# Patient Record
Sex: Female | Born: 1990
Health system: Southern US, Community
[De-identification: ages and names within clinical notes are randomized; demographics above are authoritative.]

## PROBLEM LIST (undated history)

## (undated) ENCOUNTER — Inpatient Hospital Stay (HOSPITAL_COMMUNITY): Payer: Self-pay

## (undated) ENCOUNTER — Emergency Department (HOSPITAL_BASED_OUTPATIENT_CLINIC_OR_DEPARTMENT_OTHER): Admission: EM | Payer: Managed Care, Other (non HMO)

## (undated) ENCOUNTER — Ambulatory Visit (HOSPITAL_COMMUNITY): Admission: EM | Source: Home / Self Care

## (undated) DIAGNOSIS — I10 Essential (primary) hypertension: Secondary | ICD-10-CM

## (undated) DIAGNOSIS — F419 Anxiety disorder, unspecified: Secondary | ICD-10-CM

## (undated) DIAGNOSIS — B009 Herpesviral infection, unspecified: Secondary | ICD-10-CM

## (undated) DIAGNOSIS — B999 Unspecified infectious disease: Secondary | ICD-10-CM

## (undated) DIAGNOSIS — Z8619 Personal history of other infectious and parasitic diseases: Secondary | ICD-10-CM

## (undated) HISTORY — DX: Anxiety disorder, unspecified: F41.9

## (undated) HISTORY — DX: Essential (primary) hypertension: I10

## (undated) HISTORY — DX: Personal history of other infectious and parasitic diseases: Z86.19

## (undated) HISTORY — PX: WISDOM TOOTH EXTRACTION: SHX21

## (undated) HISTORY — PX: TONSILLECTOMY AND ADENOIDECTOMY: SUR1326

---
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
## 2012-05-28 ENCOUNTER — Emergency Department (HOSPITAL_BASED_OUTPATIENT_CLINIC_OR_DEPARTMENT_OTHER)
Admission: EM | Admit: 2012-05-28 | Discharge: 2012-05-28 | Discharge status: Against Medical Advice | Payer: BC Managed Care – PPO | Attending: Emergency Medicine | Admitting: Emergency Medicine

## 2012-05-28 ENCOUNTER — Encounter (HOSPITAL_BASED_OUTPATIENT_CLINIC_OR_DEPARTMENT_OTHER): Payer: Self-pay | Admitting: *Deleted

## 2012-05-28 DIAGNOSIS — R110 Nausea: Secondary | ICD-10-CM | POA: Insufficient documentation

## 2012-05-28 DIAGNOSIS — R197 Diarrhea, unspecified: Secondary | ICD-10-CM | POA: Insufficient documentation

## 2012-05-28 DIAGNOSIS — Z3202 Encounter for pregnancy test, result negative: Secondary | ICD-10-CM | POA: Insufficient documentation

## 2012-05-28 DIAGNOSIS — R1084 Generalized abdominal pain: Secondary | ICD-10-CM | POA: Insufficient documentation

## 2012-05-28 LAB — URINALYSIS, ROUTINE W REFLEX MICROSCOPIC
Glucose, UA: NEGATIVE mg/dL
Protein, ur: NEGATIVE mg/dL
pH: 6.5 (ref 5.0–8.0)

## 2012-05-28 LAB — PREGNANCY, URINE: Preg Test, Ur: NEGATIVE

## 2012-05-28 LAB — URINE MICROSCOPIC-ADD ON

## 2012-05-28 NOTE — ED Notes (Signed)
Patient asked to be removed from the "list".  Consulting civil engineer notified.

## 2012-05-28 NOTE — ED Notes (Signed)
Pt describes generalized stabbing abd pain onset 12:30 +nausea. Denies discharge or urinary s/s. +diarrhea.

## 2012-09-24 ENCOUNTER — Emergency Department (HOSPITAL_BASED_OUTPATIENT_CLINIC_OR_DEPARTMENT_OTHER)
Admission: EM | Admit: 2012-09-24 | Discharge: 2012-09-24 | Disposition: A | Discharge status: Home / Self Care | Payer: BC Managed Care – PPO | Attending: Emergency Medicine | Admitting: Emergency Medicine

## 2012-09-24 ENCOUNTER — Encounter (HOSPITAL_BASED_OUTPATIENT_CLINIC_OR_DEPARTMENT_OTHER): Payer: Self-pay | Admitting: *Deleted

## 2012-09-24 DIAGNOSIS — N390 Urinary tract infection, site not specified: Secondary | ICD-10-CM | POA: Insufficient documentation

## 2012-09-24 DIAGNOSIS — M545 Low back pain, unspecified: Secondary | ICD-10-CM | POA: Insufficient documentation

## 2012-09-24 LAB — URINALYSIS, ROUTINE W REFLEX MICROSCOPIC
Bilirubin Urine: NEGATIVE
Ketones, ur: NEGATIVE mg/dL
Nitrite: NEGATIVE
pH: 6.5 (ref 5.0–8.0)

## 2012-09-24 LAB — URINE MICROSCOPIC-ADD ON

## 2012-09-24 MED ORDER — HYDROCODONE-ACETAMINOPHEN 5-325 MG PO TABS: 2.0000 | ORAL_TABLET | ORAL | Status: DC | PRN

## 2012-09-24 MED ORDER — SULFAMETHOXAZOLE-TRIMETHOPRIM 800-160 MG PO TABS: 1.0000 | ORAL_TABLET | Freq: Two times a day (BID) | ORAL | Status: DC

## 2012-09-24 NOTE — ED Notes (Signed)
Lower back pain that started last night. Denies any injury, denies any abd pain or urinary problems.

## 2012-09-24 NOTE — ED Provider Notes (Signed)
History    This chart was scribed for Geoffery Lyons, MD by Quintella Reichert, ED scribe.  This patient was seen in room MH08/MH08 and the patient's care was started at 9:51 PM.  CSN: [id number]  Arrival date & time 09/24/12  1906    Chief Complaint  Patient presents with  . Back Pain    The history is provided by the patient. No language interpreter was used.    HPI Comments: Annette Greene is a 22 y.o. female who presents to the Emergency Department complaining of sudden-onset, constant, moderate lower back pain that began several hours ago.  Pt reports that her pain came on suddenly when she was "trying to squat in the public restroom" because she did not want to sit on the toilet.  Pain radiates into the left buttock and is exacerbated by lying on her side.  She denies dysuria, bowel or bladder incontinence, weakness, numbness or any other associated symptoms.  She denies h/o UTIs to her knowledge.    History reviewed. No pertinent past medical history.   Past Surgical History  Procedure Laterality Date  . Tonsillectomy      No family history on file.   History  Substance Use Topics  . Smoking status: Never Smoker   . Smokeless tobacco: Not on file  . Alcohol Use: No    OB History   Grav Para Term Preterm Abortions TAB SAB Ect Mult Living                   Review of Systems  All other systems reviewed and are negative.       Allergies  Review of patient's allergies indicates no known allergies.  Home Medications  No current outpatient prescriptions on file.   BP 121/87  Pulse 78  Temp(Src) 98.5 F (36.9 C) (Oral)  Resp 20  Ht 5\' 7"  (1.702 m)  Wt 275 lb (124.739 kg)  BMI 43.06 kg/m2  SpO2 100%   Physical Exam  Nursing note and vitals reviewed. Constitutional: She is oriented to person, place, and time. She appears well-developed and well-nourished. No distress.  HENT:  Head: Normocephalic and atraumatic.  Eyes: EOM are normal.  Neck: Neck  supple. No tracheal deviation present.  Cardiovascular: Normal rate.   Pulmonary/Chest: Effort normal. No respiratory distress.  Musculoskeletal: Normal range of motion. She exhibits tenderness.  Tenderness to palpation in soft tissues of lumbar region  Neurological: She is alert and oriented to person, place, and time.  DTRs are trace and equal. Strength 5/5 to lower extremities bilaterally. Able to walk without difficulty.  Skin: Skin is warm and dry.  Psychiatric: She has a normal mood and affect. Her behavior is normal.     ED Course  Procedures (including critical care time)   DIAGNOSTIC STUDIES: Oxygen Saturation is 100% on room air, normal by my interpretation.    COORDINATION OF CARE: 9:55 PM- Informed pt that labs reveal pt has a UTI.  Discussed treatment plan which includes antibiotics and pain medication with pt at bedside and pt agreed to plan.     Labs Reviewed  URINALYSIS, ROUTINE W REFLEX MICROSCOPIC - Abnormal; Notable for the following:    APPearance CLOUDY (*)    Hgb urine dipstick TRACE (*)    Leukocytes, UA LARGE (*)    All other components within normal limits  URINE MICROSCOPIC-ADD ON - Abnormal; Notable for the following:    Squamous Epithelial / LPF MANY (*)    Bacteria, UA  MANY (*)    All other components within normal limits  URINE CULTURE    No results found.   No diagnosis found.   MDM  This does not seem like a musculoskeletal issue.  The exam and history is not concerning for an emergent cause.  Will treat with antibiotics for the urine.  Return prn.    I personally performed the services described in this documentation, which was scribed in my presence. The recorded information has been reviewed and is accurate.      Geoffery Lyons, MD 09/25/12 [phone]

## 2012-09-26 LAB — URINE CULTURE: Colony Count: 60000

## 2013-03-21 ENCOUNTER — Ambulatory Visit: Payer: BC Managed Care – PPO

## 2013-03-21 ENCOUNTER — Ambulatory Visit (INDEPENDENT_AMBULATORY_CARE_PROVIDER_SITE_OTHER): Payer: BC Managed Care – PPO | Admitting: Family Medicine

## 2013-03-21 VITALS — BP 110/70 | HR 79 | Temp 98.5°F | Resp 18 | Ht 66.5 in | Wt 297.0 lb

## 2013-03-21 DIAGNOSIS — R300 Dysuria: Secondary | ICD-10-CM

## 2013-03-21 DIAGNOSIS — R1110 Vomiting, unspecified: Secondary | ICD-10-CM

## 2013-03-21 DIAGNOSIS — R0789 Other chest pain: Secondary | ICD-10-CM

## 2013-03-21 DIAGNOSIS — M791 Myalgia, unspecified site: Secondary | ICD-10-CM

## 2013-03-21 DIAGNOSIS — IMO0001 Reserved for inherently not codable concepts without codable children: Secondary | ICD-10-CM

## 2013-03-21 DIAGNOSIS — D71 Functional disorders of polymorphonuclear neutrophils: Secondary | ICD-10-CM

## 2013-03-21 DIAGNOSIS — B9789 Other viral agents as the cause of diseases classified elsewhere: Secondary | ICD-10-CM

## 2013-03-21 DIAGNOSIS — B349 Viral infection, unspecified: Secondary | ICD-10-CM

## 2013-03-21 LAB — POCT URINALYSIS DIPSTICK
Bilirubin, UA: NEGATIVE
Glucose, UA: NEGATIVE
Ketones, UA: NEGATIVE
Leukocytes, UA: NEGATIVE
Nitrite, UA: NEGATIVE

## 2013-03-21 LAB — POCT CBC
Granulocyte percent: 43 %G (ref 37–80)
HCT, POC: 38.6 % (ref 37.7–47.9)
Lymph, poc: 2 (ref 0.6–3.4)
MCH, POC: 25.2 pg — AB (ref 27–31.2)
MCHC: 30.1 g/dL — AB (ref 31.8–35.4)
MCV: 84 fL (ref 80–97)
POC LYMPH PERCENT: 46.9 %L (ref 10–50)
RDW, POC: 16.9 %
WBC: 4.2 10*3/uL — AB (ref 4.6–10.2)

## 2013-03-21 LAB — POCT UA - MICROSCOPIC ONLY: Yeast, UA: NEGATIVE

## 2013-03-21 MED ORDER — ONDANSETRON 4 MG PO TBDP: ORAL_TABLET | ORAL | Status: DC

## 2013-03-21 NOTE — Progress Notes (Signed)
Subjective: 22 year old lady who's had some problem since Christmas Day. She got nausea  and vomited, about 3 times. She did not have any diarrhea or constipation. She has had low available aching when she bears down to go to the bathroom, either to move her bowels or to urinate. She has a headache and generalized body aches. Her chest feels tight and like it is hard to take a good deep breath. She does not have earaches or sore throat or cough. She has not been running a fever. She had her last menstrual cycle couple of weeks ago. She is sexually involved but faithfully uses condoms 100% of the time. 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. She lives at home. There are some smaller children in the home. No one else has had the same kind of symptoms. She does have some mild dysuria. She only has a history of one urinary tract infection.  Objective: Overweight young lady in no major acute distress. TMs are normal. Throat clear. Neck supple without significant nodes. Chest is clear to auscultation. Heart regular without murmurs. Abdomen was soft without hepatomegaly or masses. Mild left lower quadrant and low  abdominall nonspecific tenderness. Results for orders placed in visit on 03/21/13  POCT UA - MICROSCOPIC ONLY      Result Value Range   WBC, Ur, HPF, POC 2-5     RBC, urine, microscopic 0-4     Bacteria, U Microscopic 2+     Mucus, UA positive     Epithelial cells, urine per micros 0-5     Crystals, Ur, HPF, POC neg     Casts, Ur, LPF, POC neg     Yeast, UA neg    POCT URINALYSIS DIPSTICK      Result Value Range   Color, UA amber     Clarity, UA cloudy     Glucose, UA neg     Bilirubin, UA neg     Ketones, UA neg     Spec Grav, UA >=1.030     Blood, UA trace-intact     pH, UA 5.5     Protein, UA 30     Urobilinogen, UA 0.2     Nitrite, UA neg     Leukocytes, UA Negative       Assessment: Vomiting Body aches Chest tightness and difficulty taking a deep  breath Dysuria without evidence for UTI  Plan: Check CBC Chest x-ray Urine hcg  Results for orders placed in visit on 03/21/13  POCT UA - MICROSCOPIC ONLY      Result Value Range   WBC, Ur, HPF, POC 2-5     RBC, urine, microscopic 0-4     Bacteria, U Microscopic 2+     Mucus, UA positive     Epithelial cells, urine per micros 0-5     Crystals, Ur, HPF, POC neg     Casts, Ur, LPF, POC neg     Yeast, UA neg    POCT URINALYSIS DIPSTICK      Result Value Range   Color, UA amber     Clarity, UA cloudy     Glucose, UA neg     Bilirubin, UA neg     Ketones, UA neg     Spec Grav, UA >=1.030     Blood, UA trace-intact     pH, UA 5.5     Protein, UA 30     Urobilinogen, UA 0.2     Nitrite,  UA neg     Leukocytes, UA Negative    POCT CBC      Result Value Range   WBC 4.2 (*) 4.6 - 10.2 K/uL   Lymph, poc 2.0  0.6 - 3.4   POC LYMPH PERCENT 46.9  10 - 50 %L   MID (cbc) 0.4  0 - 0.9   POC MID % 10.1  0 - 12 %M   POC Granulocyte 1.8 (*) 2 - 6.9   Granulocyte percent 43.0  37 - 80 %G   RBC 4.60  4.04 - 5.48 M/uL   Hemoglobin 11.6 (*) 12.2 - 16.2 g/dL   HCT, POC 95.6  21.3 - 47.9 %   MCV 84.0  80 - 97 fL   MCH, POC 25.2 (*) 27 - 31.2 pg   MCHC 30.1 (*) 31.8 - 35.4 g/dL   RDW, POC 08.6     Platelet Count, POC 241  142 - 424 K/uL   MPV 9.9  0 - 99.8 fL  POCT URINE PREGNANCY      Result Value Range   Preg Test, Ur Negative     [facility] reading (PRIMARY) by  Dr. Alwyn Ren Normal chet  Assessment: Nonspecific symptoms, presumably viral  Plan: Zofran for nausea Drink plenty of fluids If urinary symptoms continue to persist get rechecked, but this should pass on by  .

## 2013-03-21 NOTE — Patient Instructions (Signed)
Drink plenty of fluids  Get plenty of rest  Take Zofran if needed for nausea  Get rechecked if worse  Tylenol or ibuprofen for pain

## 2013-06-17 ENCOUNTER — Emergency Department (HOSPITAL_BASED_OUTPATIENT_CLINIC_OR_DEPARTMENT_OTHER): Payer: BC Managed Care – PPO

## 2013-06-17 ENCOUNTER — Emergency Department (HOSPITAL_BASED_OUTPATIENT_CLINIC_OR_DEPARTMENT_OTHER)
Admission: EM | Admit: 2013-06-17 | Discharge: 2013-06-17 | Disposition: A | Discharge status: Home / Self Care | Payer: BC Managed Care – PPO | Attending: Emergency Medicine | Admitting: Emergency Medicine

## 2013-06-17 ENCOUNTER — Encounter (HOSPITAL_BASED_OUTPATIENT_CLINIC_OR_DEPARTMENT_OTHER): Payer: Self-pay | Admitting: Emergency Medicine

## 2013-06-17 DIAGNOSIS — Y92009 Unspecified place in unspecified non-institutional (private) residence as the place of occurrence of the external cause: Secondary | ICD-10-CM | POA: Insufficient documentation

## 2013-06-17 DIAGNOSIS — W010XXA Fall on same level from slipping, tripping and stumbling without subsequent striking against object, initial encounter: Secondary | ICD-10-CM | POA: Insufficient documentation

## 2013-06-17 DIAGNOSIS — Y9389 Activity, other specified: Secondary | ICD-10-CM | POA: Insufficient documentation

## 2013-06-17 DIAGNOSIS — F17200 Nicotine dependence, unspecified, uncomplicated: Secondary | ICD-10-CM | POA: Insufficient documentation

## 2013-06-17 DIAGNOSIS — S5001XA Contusion of right elbow, initial encounter: Secondary | ICD-10-CM

## 2013-06-17 DIAGNOSIS — S5000XA Contusion of unspecified elbow, initial encounter: Secondary | ICD-10-CM | POA: Insufficient documentation

## 2013-06-17 NOTE — ED Notes (Signed)
Pt states that she was playing around when she fell down one step off her porch. C/o right arm pain. C/o numbness and tingling to right arm. Pain worse with movement. Denies loc. No obvious deformity but pain when touching arm above right elbow down right forearm.

## 2013-06-17 NOTE — ED Provider Notes (Addendum)
CSN: [id number]     Arrival date & time 06/17/13  0117 History   First MD Initiated Contact with Patient 06/17/13 0359     Chief Complaint  Patient presents with  . Arm Injury     (Consider location/radiation/quality/duration/timing/severity/associated sxs/prior Treatment) HPI This is a 23 year old female who fell off her porch after slipping. This occurred about midnight. She is having pain in her right elbow which radiates down her right forearm. The pain is 7/10 at its worst. It is exacerbated by flexion and extension at the elbow and by palpation but not by pronation or supination.  History reviewed. No pertinent past medical history. Past Surgical History  Procedure Laterality Date  . Tonsillectomy     Family History  Problem Relation Age of Onset  . Healthy Mother   . Healthy Father   . Healthy Sister   . Healthy Maternal Grandmother   . Healthy Paternal Grandmother   . Healthy Paternal Grandfather   . Healthy Sister    History  Substance Use Topics  . Smoking status: Current Every Day Smoker  . Smokeless tobacco: Not on file  . Alcohol Use: Yes     Comment: occasional    OB History   Grav Para Term Preterm Abortions TAB SAB Ect Mult Living                 Review of Systems  All other systems reviewed and are negative.   Allergies  Review of patient's allergies indicates no known allergies.  Home Medications   Current Outpatient Rx  Name  Route  Sig  Dispense  Refill  . ondansetron (ZOFRAN ODT) 4 MG disintegrating tablet      Take one every 6 hours if needed for nausea or vomiting   10 tablet   0    BP 136/65  Pulse 67  Temp(Src) 98.3 F (36.8 C) (Oral)  Resp 16  Ht 5\' 7"  (1.702 m)  Wt 270 lb (122.471 kg)  BMI 42.28 kg/m2  SpO2 100%  LMP 06/12/2013  Physical Exam General: Well-developed, well-nourished female in no acute distress; appearance consistent with age of record HENT: normocephalic; atraumatic Eyes: Normal appearance Neck:  supple Heart: regular rate and rhythm Lungs: Normal respiratory effort and excursion Abdomen: soft; nondistended Extremities: No deformity; tenderness of right elbow just proximal to the olecranon with pain on flexion and extension of the right elbow; no tenderness of the right forearm, wrist, snuff box or hand Neurologic: Awake, alert and oriented; motor function intact in all extremities and symmetric; no facial droop Skin: Warm and dry Psychiatric: Normal mood and affect    ED Course  Procedures (including critical care time)   MDM   Nursing notes and vitals signs, including pulse oximetry, reviewed.  Summary of this visit's results, reviewed by myself:  Imaging Studies: Dg Forearm Right  06/17/2013   CLINICAL DATA:  Fall, mid forearm pain.  EXAM: RIGHT FOREARM - 2 VIEW  COMPARISON:  None.  FINDINGS: There is no evidence of fracture or other focal bone lesions. Soft tissues are unremarkable.  IMPRESSION: Negative.   Electronically Signed   By: Elon Alas   On: 06/17/2013 02:07   4:09 AM Patient advised of xray findings. No sail sign seen. Patient declines a sling or analgesic.     Wynetta Fines, MD 06/17/13 0408  Wynetta Fines, MD 06/17/13 3818

## 2013-06-17 NOTE — Discharge Instructions (Signed)
Elbow Contusion °An elbow contusion is a deep bruise of the elbow. Contusions are the result of an injury that caused bleeding under the skin. The contusion may turn blue, purple, or yellow. Minor injuries will give you a painless contusion, but more severe contusions may stay painful and swollen for a few weeks.  °CAUSES  °An elbow contusion comes from a direct force to that area, such as falling on the elbow. °SYMPTOMS  °· Swelling and redness of the elbow. °· Bruising of the elbow area. °· Tenderness or soreness of the elbow. °DIAGNOSIS  °You will have a physical exam and will be asked about your history. You may need an X-ray of your elbow to look for a broken bone (fracture).  °TREATMENT  °A sling or splint may be needed to support your injury. Resting, elevating, and applying cold compresses to the elbow area are often the best treatments for an elbow contusion. Over-the-counter medicines may also be recommended for pain control. °HOME CARE INSTRUCTIONS  °· Put ice on the injured area. °· Put ice in a plastic bag. °· Place a towel between your skin and the bag. °· Leave the ice on for 15-20 minutes, 03-04 times a day. °· Only take over-the-counter or prescription medicines for pain, discomfort, or fever as directed by your caregiver. °· Rest your injured elbow until the pain and swelling are better. °· Elevate your elbow to reduce swelling. °· Apply a compression wrap as directed by your caregiver. This can help reduce swelling and motion. You may remove the wrap for sleeping, showers, and baths. If your fingers become numb, cold, or blue, take the wrap off and reapply it more loosely. °· Use your elbow only as directed by your caregiver. You may be asked to do range of motion exercises. Do them as directed. °· See your caregiver as directed. It is very important to keep all follow-up appointments in order to avoid any long-term problems with your elbow, including chronic pain or inability to move your elbow  normally. °SEEK IMMEDIATE MEDICAL CARE IF:  °· You have increased redness, swelling, or pain in your elbow. °· Your swelling or pain is not relieved with medicines. °· You have swelling of the hand and fingers. °· You are unable to move your fingers or wrist. °· You begin to lose feeling in your hand or fingers. °· Your fingers or hand become cold or blue. °MAKE SURE YOU:  °· Understand these instructions. °· Will watch your condition. °· Will get help right away if you are not doing well or get worse. °Document Released: 02/14/2006 Document Revised: 05/31/2011 Document Reviewed: 01/22/2011 °[employer]® Patient Information ©2014 [employer]. ° °

## 2013-12-18 ENCOUNTER — Encounter (HOSPITAL_COMMUNITY): Payer: Self-pay | Admitting: Emergency Medicine

## 2013-12-18 ENCOUNTER — Emergency Department (INDEPENDENT_AMBULATORY_CARE_PROVIDER_SITE_OTHER)
Admission: EM | Admit: 2013-12-18 | Discharge: 2013-12-18 | Disposition: A | Discharge status: Home / Self Care | Payer: BC Managed Care – PPO | Source: Home / Self Care | Attending: Family Medicine | Admitting: Family Medicine

## 2013-12-18 DIAGNOSIS — A084 Viral intestinal infection, unspecified: Secondary | ICD-10-CM

## 2013-12-18 DIAGNOSIS — A088 Other specified intestinal infections: Secondary | ICD-10-CM

## 2013-12-18 LAB — POCT I-STAT, CHEM 8
BUN: 8 mg/dL (ref 6–23)
Calcium, Ion: 1.12 mmol/L (ref 1.12–1.23)
Chloride: 103 mEq/L (ref 96–112)
Creatinine, Ser: 0.7 mg/dL (ref 0.50–1.10)
GLUCOSE: 99 mg/dL (ref 70–99)
HEMATOCRIT: 44 % (ref 36.0–46.0)
HEMOGLOBIN: 15 g/dL (ref 12.0–15.0)
POTASSIUM: 4.2 meq/L (ref 3.7–5.3)
SODIUM: 140 meq/L (ref 137–147)
TCO2: 28 mmol/L (ref 0–100)

## 2013-12-18 LAB — POCT PREGNANCY, URINE: Preg Test, Ur: NEGATIVE

## 2013-12-18 MED ORDER — ONDANSETRON 4 MG PO TBDP
ORAL_TABLET | ORAL | Status: AC
  Filled 2013-12-18: qty 2

## 2013-12-18 MED ORDER — GI COCKTAIL ~~LOC~~
30.0000 mL | Freq: Once | ORAL | Status: AC
  Administered 2013-12-18: 30 mL via ORAL

## 2013-12-18 MED ORDER — ONDANSETRON 4 MG PO TBDP
8.0000 mg | ORAL_TABLET | Freq: Once | ORAL | Status: AC
  Administered 2013-12-18: 8 mg via ORAL

## 2013-12-18 MED ORDER — ONDANSETRON HCL 8 MG PO TABS: 8.0000 mg | ORAL_TABLET | Freq: Three times a day (TID) | ORAL | Status: DC | PRN

## 2013-12-18 MED ORDER — GI COCKTAIL ~~LOC~~
ORAL | Status: AC
  Filled 2013-12-18: qty 30

## 2013-12-18 NOTE — Discharge Instructions (Signed)
Thank you for coming in today. Take Zofran as needed for vomiting Come back as needed If your belly pain worsens, or you have high fever, bad vomiting, blood in your stool or black tarry stool go to the Emergency Room.   Viral Gastroenteritis Viral gastroenteritis is also known as stomach flu. This condition affects the stomach and intestinal tract. It can cause sudden diarrhea and vomiting. The illness typically lasts 3 to 8 days. Most people develop an immune response that eventually gets rid of the virus. While this natural response develops, the virus can make you quite ill. CAUSES  Many different viruses can cause gastroenteritis, such as rotavirus or noroviruses. You can catch one of these viruses by consuming contaminated food or water. You may also catch a virus by sharing utensils or other personal items with an infected person or by touching a contaminated surface. SYMPTOMS  The most common symptoms are diarrhea and vomiting. These problems can cause a severe loss of body fluids (dehydration) and a body salt (electrolyte) imbalance. Other symptoms may include:  Fever.  Headache.  Fatigue.  Abdominal pain. DIAGNOSIS  Your caregiver can usually diagnose viral gastroenteritis based on your symptoms and a physical exam. A stool sample may also be taken to test for the presence of viruses or other infections. TREATMENT  This illness typically goes away on its own. Treatments are aimed at rehydration. The most serious cases of viral gastroenteritis involve vomiting so severely that you are not able to keep fluids down. In these cases, fluids must be given through an intravenous line (IV). HOME CARE INSTRUCTIONS   Drink enough fluids to keep your urine clear or pale yellow. Drink small amounts of fluids frequently and increase the amounts as tolerated.  Ask your caregiver for specific rehydration instructions.  Avoid:  Foods high in sugar.  Alcohol.  Carbonated  drinks.  Tobacco.  Juice.  Caffeine drinks.  Extremely hot or cold fluids.  Fatty, greasy foods.  Too much intake of anything at one time.  Dairy products until 24 to 48 hours after diarrhea stops.  You may consume probiotics. Probiotics are active cultures of beneficial bacteria. They may lessen the amount and number of diarrheal stools in adults. Probiotics can be found in yogurt with active cultures and in supplements.  Wash your hands well to avoid spreading the virus.  Only take over-the-counter or prescription medicines for pain, discomfort, or fever as directed by your caregiver. Do not give aspirin to children. Antidiarrheal medicines are not recommended.  Ask your caregiver if you should continue to take your regular prescribed and over-the-counter medicines.  Keep all follow-up appointments as directed by your caregiver. SEEK IMMEDIATE MEDICAL CARE IF:   You are unable to keep fluids down.  You do not urinate at least once every 6 to 8 hours.  You develop shortness of breath.  You notice blood in your stool or vomit. This may look like coffee grounds.  You have abdominal pain that increases or is concentrated in one small area (localized).  You have persistent vomiting or diarrhea.  You have a fever.  The patient is a child younger than 3 months, and he or she has a fever.  The patient is a child older than 3 months, and he or she has a fever and persistent symptoms.  The patient is a child older than 3 months, and he or she has a fever and symptoms suddenly get worse.  The patient is a baby, and he or  she has no tears when crying. MAKE SURE YOU:   Understand these instructions.  Will watch your condition.  Will get help right away if you are not doing well or get worse. Document Released: 03/08/2005 Document Revised: 05/31/2011 Document Reviewed: 12/23/2010 [facility] Patient Information 2015 [place], [place]. This information is not intended to replace  advice given to you by your health care provider. Make sure you discuss any questions you have with your health care provider.

## 2013-12-18 NOTE — ED Notes (Signed)
Reports drinking liquor yesterday unsure of how much she consumed.  C/o n/v and diarrhea since.   No otc treatments tried.

## 2013-12-18 NOTE — ED Provider Notes (Signed)
Annette Greene is a 23 y.o. female who presents to Urgent Care today for nausea and vomiting. Patient drank excessively yesterday evening he developed nausea and vomiting and diarrhea since this morning. She additionally notes subjective fevers and jittery sensation. She feels quite miserable. No chest pains or palpitations. She's tried no medications yet.   History reviewed. No pertinent past medical history. History  Substance Use Topics  . Smoking status: Current Every Day Smoker  . Smokeless tobacco: Not on file  . Alcohol Use: Yes     Comment: occasional    ROS as above Medications: No current facility-administered medications for this encounter.   Current Outpatient Prescriptions  Medication Sig Dispense Refill  . ondansetron (ZOFRAN) 8 MG tablet Take 1 tablet (8 mg total) by mouth every 8 (eight) hours as needed for nausea or vomiting.  20 tablet  0    Exam:  BP 134/79  Pulse 104  Temp(Src) 98.6 F (37 C) (Oral)  Resp 18  SpO2 97%  LMP 11/17/2013 Gen: Well NAD HEENT: EOMI,  MMM Lungs: Normal work of breathing. CTABL Heart: RRR no MRG Abd: NABS, Soft. Nondistended, Nontender Exts: Brisk capillary refill, warm and well perfused.   Patient was given 8 mg of oral Zofran and a GI cocktail and some ginger ale and felt a lot better.  Results for orders placed during the hospital encounter of 12/18/13 (from the past 24 hour(s))  POCT PREGNANCY, URINE     Status: None   Collection Time    12/18/13  7:28 PM      Result Value Ref Range   Preg Test, Ur NEGATIVE  NEGATIVE  POCT I-STAT, CHEM 8     Status: None   Collection Time    12/18/13  8:10 PM      Result Value Ref Range   Sodium 140  137 - 147 mEq/L   Potassium 4.2  3.7 - 5.3 mEq/L   Chloride 103  96 - 112 mEq/L   BUN 8  6 - 23 mg/dL   Creatinine, Ser 0.70  0.50 - 1.10 mg/dL   Glucose, Bld 99  70 - 99 mg/dL   Calcium, Ion 1.12  1.12 - 1.23 mmol/L   TCO2 28  0 - 100 mmol/L   Hemoglobin 15.0  12.0 - 15.0 g/dL   HCT 44.0  36.0 - 46.0 %   No results found.  Assessment and Plan: 23 y.o. female with viral gastroenteritis plus hangover. Plan to treat with Zofran rest oral hydration and watchful waiting.  Discussed warning signs or symptoms. Please see discharge instructions. Patient expresses understanding.     Gregor Hams, MD 12/18/13 2044

## 2014-06-02 ENCOUNTER — Encounter (HOSPITAL_BASED_OUTPATIENT_CLINIC_OR_DEPARTMENT_OTHER): Payer: Self-pay | Admitting: Emergency Medicine

## 2014-06-02 DIAGNOSIS — Z720 Tobacco use: Secondary | ICD-10-CM | POA: Insufficient documentation

## 2014-06-02 DIAGNOSIS — A5901 Trichomonal vulvovaginitis: Secondary | ICD-10-CM | POA: Insufficient documentation

## 2014-06-02 DIAGNOSIS — Z3202 Encounter for pregnancy test, result negative: Secondary | ICD-10-CM | POA: Insufficient documentation

## 2014-06-02 NOTE — ED Notes (Signed)
Pt reports intermittent abd pain that she is currently having admits to nausea denies emesis or diarrhea

## 2014-06-03 ENCOUNTER — Emergency Department (HOSPITAL_BASED_OUTPATIENT_CLINIC_OR_DEPARTMENT_OTHER)
Admission: EM | Admit: 2014-06-03 | Discharge: 2014-06-03 | Disposition: A | Discharge status: Home / Self Care | Payer: Self-pay | Attending: Emergency Medicine | Admitting: Emergency Medicine

## 2014-06-03 DIAGNOSIS — A5901 Trichomonal vulvovaginitis: Secondary | ICD-10-CM

## 2014-06-03 LAB — WET PREP, GENITAL
CLUE CELLS WET PREP: NONE SEEN
YEAST WET PREP: NONE SEEN

## 2014-06-03 LAB — URINALYSIS, ROUTINE W REFLEX MICROSCOPIC
Bilirubin Urine: NEGATIVE
GLUCOSE, UA: NEGATIVE mg/dL
Hgb urine dipstick: NEGATIVE
Ketones, ur: NEGATIVE mg/dL
NITRITE: NEGATIVE
PH: 6.5 (ref 5.0–8.0)
Protein, ur: NEGATIVE mg/dL
Specific Gravity, Urine: 1.026 (ref 1.005–1.030)
Urobilinogen, UA: 0.2 mg/dL (ref 0.0–1.0)

## 2014-06-03 LAB — URINE MICROSCOPIC-ADD ON

## 2014-06-03 LAB — PREGNANCY, URINE: Preg Test, Ur: NEGATIVE

## 2014-06-03 MED ORDER — METRONIDAZOLE 500 MG PO TABS
2000.0000 mg | ORAL_TABLET | Freq: Once | ORAL | Status: AC
  Administered 2014-06-03: 2000 mg via ORAL
  Filled 2014-06-03: qty 4

## 2014-06-03 NOTE — Discharge Instructions (Signed)

## 2014-06-03 NOTE — ED Provider Notes (Signed)
CSN: [id number]     Arrival date & time 06/02/14  2327 History   First MD Initiated Contact with Patient 06/03/14 0113     Chief Complaint  Patient presents with  . Abdominal Pain     (Consider location/radiation/quality/duration/timing/severity/associated sxs/prior Treatment) HPI This is a 24 year old female whose last menstrual period was December 27 of 2015. She is here complaining of a one-month history of lower abdominal cramping. The cramping is described as a 5 out of 10 at its worst. It is similar to menstrual cramping. She has had no vaginal bleeding since her last period. She does have a white vaginal discharge. She admits to unprotected sex. She is not using any form of birth control. She denies fever, chills, vomiting, diarrhea or dysuria. She has had occasional nausea.  History reviewed. No pertinent past medical history. Past Surgical History  Procedure Laterality Date  . Tonsillectomy     Family History  Problem Relation Age of Onset  . Healthy Mother   . Healthy Father   . Healthy Sister   . Healthy Maternal Grandmother   . Healthy Paternal Grandmother   . Healthy Paternal Grandfather   . Healthy Sister    History  Substance Use Topics  . Smoking status: Current Every Day Smoker  . Smokeless tobacco: Not on file  . Alcohol Use: Yes     Comment: occasional    OB History    No data available     Review of Systems  All other systems reviewed and are negative.   Allergies  Review of patient's allergies indicates no known allergies.  Home Medications   Prior to Admission medications   Medication Sig Start Date End Date Taking? Authorizing Provider  ondansetron (ZOFRAN) 8 MG tablet Take 1 tablet (8 mg total) by mouth every 8 (eight) hours as needed for nausea or vomiting. 12/18/13   Gregor Hams, MD   BP 124/53 mmHg  Pulse 79  Temp(Src) 97.8 F (36.6 C) (Oral)  Resp 18  SpO2 100%  LMP 03/17/2014   Physical Exam  General: Well-developed,  well-nourished female in no acute distress; appearance consistent with age of record HENT: normocephalic; atraumatic Eyes: pupils equal, round and reactive to light; extraocular muscles intact Neck: supple Heart: regular rate and rhythm Lungs: clear to auscultation bilaterally Abdomen: soft; nondistended; nontender; no masses or hepatosplenomegaly; bowel sounds present GU: normal external genitalia; serous vaginal discharge; no vaginal bleeding; no cervical motion tenderness; no adnexal tenderness Extremities: No deformity; full range of motion; pulses normal Neurologic: Awake, alert and oriented; motor function intact in all extremities and symmetric; no facial droop Skin: Warm and dry Psychiatric: Normal mood and affect    ED Course  Procedures (including critical care time)   MDM  Nursing notes and vitals signs, including pulse oximetry, reviewed.  Summary of this visit's results, reviewed by myself:  Labs:  Results for orders placed or performed during the hospital encounter of 06/03/14 (from the past 24 hour(s))  Urinalysis, Routine w reflex microscopic     Status: Abnormal   Collection Time: 06/03/14  1:34 AM  Result Value Ref Range   Color, Urine YELLOW YELLOW   APPearance CLEAR CLEAR   Specific Gravity, Urine 1.026 1.005 - 1.030   pH 6.5 5.0 - 8.0   Glucose, UA NEGATIVE NEGATIVE mg/dL   Hgb urine dipstick NEGATIVE NEGATIVE   Bilirubin Urine NEGATIVE NEGATIVE   Ketones, ur NEGATIVE NEGATIVE mg/dL   Protein, ur NEGATIVE NEGATIVE mg/dL   Urobilinogen,  UA 0.2 0.0 - 1.0 mg/dL   Nitrite NEGATIVE NEGATIVE   Leukocytes, UA SMALL (A) NEGATIVE  Pregnancy, urine     Status: None   Collection Time: 06/03/14  1:34 AM  Result Value Ref Range   Preg Test, Ur NEGATIVE NEGATIVE  Urine microscopic-add on     Status: Abnormal   Collection Time: 06/03/14  1:34 AM  Result Value Ref Range   Squamous Epithelial / LPF RARE RARE   WBC, UA 3-6 <3 WBC/hpf   RBC / HPF 0-2 <3 RBC/hpf    Bacteria, UA FEW (A) RARE   Urine-Other MUCOUS PRESENT   Wet prep, genital     Status: Abnormal   Collection Time: 06/03/14  1:35 AM  Result Value Ref Range   Yeast Wet Prep HPF POC NONE SEEN NONE SEEN   Trich, Wet Prep FEW (A) NONE SEEN   Clue Cells Wet Prep HPF POC NONE SEEN NONE SEEN   WBC, Wet Prep HPF POC FEW (A) NONE SEEN      Shanon Rosser, MD 06/03/14 [phone]

## 2014-06-03 NOTE — ED Notes (Signed)
Patient reports lower abdominal pain for the past month.  Reports that she has not had a menstrual cycle since March 17, 2014.  Reports that she took a pregnancy test in February which was negative.  Reports that she is sexually active and does not use protection.  Reports she has had a milky white vaginal discharge.  Reports vaginal bleeding in January but reports it was just "spotting". Reports after sex she has also had bleeding.

## 2014-06-04 LAB — RPR: RPR: NONREACTIVE

## 2014-06-04 LAB — HIV ANTIBODY (ROUTINE TESTING W REFLEX): HIV SCREEN 4TH GENERATION: NONREACTIVE

## 2014-06-04 LAB — GC/CHLAMYDIA PROBE AMP (~~LOC~~) NOT AT ARMC
Chlamydia: NEGATIVE
NEISSERIA GONORRHEA: NEGATIVE

## 2014-06-16 ENCOUNTER — Emergency Department (HOSPITAL_COMMUNITY)
Admission: EM | Admit: 2014-06-16 | Discharge: 2014-06-16 | Disposition: A | Discharge status: Home / Self Care | Payer: Self-pay | Attending: Emergency Medicine | Admitting: Emergency Medicine

## 2014-06-16 ENCOUNTER — Encounter (HOSPITAL_COMMUNITY): Payer: Self-pay | Admitting: Physical Medicine and Rehabilitation

## 2014-06-16 DIAGNOSIS — Z3202 Encounter for pregnancy test, result negative: Secondary | ICD-10-CM | POA: Insufficient documentation

## 2014-06-16 DIAGNOSIS — K529 Noninfective gastroenteritis and colitis, unspecified: Secondary | ICD-10-CM | POA: Insufficient documentation

## 2014-06-16 DIAGNOSIS — Z720 Tobacco use: Secondary | ICD-10-CM | POA: Insufficient documentation

## 2014-06-16 DIAGNOSIS — R112 Nausea with vomiting, unspecified: Secondary | ICD-10-CM

## 2014-06-16 DIAGNOSIS — R197 Diarrhea, unspecified: Secondary | ICD-10-CM

## 2014-06-16 LAB — CBC WITH DIFFERENTIAL/PLATELET
BASOS ABS: 0 10*3/uL (ref 0.0–0.1)
BASOS PCT: 0 % (ref 0–1)
EOS ABS: 0.1 10*3/uL (ref 0.0–0.7)
EOS PCT: 2 % (ref 0–5)
HCT: 33.3 % — ABNORMAL LOW (ref 36.0–46.0)
HEMOGLOBIN: 10.4 g/dL — AB (ref 12.0–15.0)
LYMPHS ABS: 2.1 10*3/uL (ref 0.7–4.0)
Lymphocytes Relative: 32 % (ref 12–46)
MCH: 24.9 pg — ABNORMAL LOW (ref 26.0–34.0)
MCHC: 31.2 g/dL (ref 30.0–36.0)
MCV: 79.7 fL (ref 78.0–100.0)
MONO ABS: 0.4 10*3/uL (ref 0.1–1.0)
MONOS PCT: 7 % (ref 3–12)
NEUTROS ABS: 3.9 10*3/uL (ref 1.7–7.7)
Neutrophils Relative %: 59 % (ref 43–77)
Platelets: 302 10*3/uL (ref 150–400)
RBC: 4.18 MIL/uL (ref 3.87–5.11)
RDW: 15.2 % (ref 11.5–15.5)
WBC: 6.6 10*3/uL (ref 4.0–10.5)

## 2014-06-16 LAB — COMPREHENSIVE METABOLIC PANEL
ALBUMIN: 3.2 g/dL — AB (ref 3.5–5.2)
ALT: 16 U/L (ref 0–35)
ANION GAP: 8 (ref 5–15)
AST: 17 U/L (ref 0–37)
Alkaline Phosphatase: 54 U/L (ref 39–117)
BUN: 9 mg/dL (ref 6–23)
CO2: 29 mmol/L (ref 19–32)
Calcium: 8.7 mg/dL (ref 8.4–10.5)
Chloride: 104 mmol/L (ref 96–112)
Creatinine, Ser: 0.76 mg/dL (ref 0.50–1.10)
GFR calc non Af Amer: >90 mL/min (ref >90)
GLUCOSE: 109 mg/dL — AB (ref 70–99)
POTASSIUM: 3.9 mmol/L (ref 3.5–5.1)
Sodium: 141 mmol/L (ref 135–145)
Total Bilirubin: 0.1 mg/dL — ABNORMAL LOW (ref 0.3–1.2)
Total Protein: 6.6 g/dL (ref 6.0–8.3)

## 2014-06-16 LAB — URINALYSIS, ROUTINE W REFLEX MICROSCOPIC
Bilirubin Urine: NEGATIVE
Glucose, UA: NEGATIVE mg/dL
Ketones, ur: NEGATIVE mg/dL
Leukocytes, UA: NEGATIVE
NITRITE: NEGATIVE
Protein, ur: NEGATIVE mg/dL
Specific Gravity, Urine: 1.034 — ABNORMAL HIGH (ref 1.005–1.030)
UROBILINOGEN UA: 0.2 mg/dL (ref 0.0–1.0)
pH: 6 (ref 5.0–8.0)

## 2014-06-16 LAB — POC URINE PREG, ED: Preg Test, Ur: NEGATIVE

## 2014-06-16 LAB — URINE MICROSCOPIC-ADD ON

## 2014-06-16 MED ORDER — ONDANSETRON HCL 8 MG PO TABS: 8.0000 mg | ORAL_TABLET | Freq: Three times a day (TID) | ORAL | Status: DC | PRN

## 2014-06-16 MED ORDER — ONDANSETRON 4 MG PO TBDP
8.0000 mg | ORAL_TABLET | Freq: Once | ORAL | Status: AC
Start: 2014-06-16 — End: 2014-06-16
  Administered 2014-06-16: 8 mg via ORAL
  Filled 2014-06-16: qty 2

## 2014-06-16 NOTE — ED Notes (Signed)
Pt presents to department for evaluation of nausea/vomiting and diarrhea. Onset this morning. Last period December 27th. Denies pain. Pt is alert and oriented x4.

## 2014-06-16 NOTE — Discharge Instructions (Signed)
Use zofran as prescribed, as needed for nausea. Stay well hydrated with small sips of fluids throughout the day. Follow a BRAT (banana-rice-applesauce-toast) diet as described below for the next 24-48 hours. The 'BRAT' diet is suggested, then progress to diet as tolerated as symptoms abate. Call if bloody stools, persistent diarrhea, vomiting, fever or abdominal pain. Follow up with [facility] in 1 week for ongoing management, and to establish care. Return to ER for changing or worsening of symptoms.  Food Choices to Help Relieve Diarrhea When you have diarrhea, the foods you eat and your eating habits are very important. Choosing the right foods and drinks can help relieve diarrhea. Also, because diarrhea can last up to 7 days, you need to replace lost fluids and electrolytes (such as sodium, potassium, and chloride) in order to help prevent dehydration.  WHAT GENERAL GUIDELINES DO I NEED TO FOLLOW?  Slowly drink 1 cup (8 oz) of fluid for each episode of diarrhea. If you are getting enough fluid, your urine will be clear or pale yellow.  Eat starchy foods. Some good choices include white rice, white toast, pasta, low-fiber cereal, baked potatoes (without the skin), saltine crackers, and bagels.  Avoid large servings of any cooked vegetables.  Limit fruit to two servings per day. A serving is  cup or 1 small piece.  Choose foods with less than 2 g of fiber per serving.  Limit fats to less than 8 tsp (38 g) per day.  Avoid fried foods.  Eat foods that have probiotics in them. Probiotics can be found in certain dairy products.  Avoid foods and beverages that may increase the speed at which food moves through the stomach and intestines (gastrointestinal tract). Things to avoid include:  High-fiber foods, such as dried fruit, raw fruits and vegetables, nuts, seeds, and whole grain foods.  Spicy foods and high-fat foods.  Foods and beverages sweetened with high-fructose corn  syrup, honey, or sugar alcohols such as xylitol, sorbitol, and mannitol. WHAT FOODS ARE RECOMMENDED? Grains White rice. White, [place], or pita breads (fresh or toasted), including plain rolls, buns, or bagels. White pasta. Saltine, soda, or graham crackers. Pretzels. Low-fiber cereal. Cooked cereals made with water (such as cornmeal, farina, or cream cereals). Plain muffins. Matzo. Melba toast. Zwieback.  Vegetables Potatoes (without the skin). Strained tomato and vegetable juices. Most well-cooked and canned vegetables without seeds. Tender lettuce. Fruits Cooked or canned applesauce, apricots, cherries, fruit cocktail, grapefruit, peaches, pears, or plums. Fresh bananas, apples without skin, cherries, grapes, cantaloupe, grapefruit, peaches, oranges, or plums.  Meat and Other Protein Products Baked or boiled chicken. Eggs. Tofu. Fish. Seafood. Smooth peanut butter. Ground or well-cooked tender beef, ham, veal, lamb, pork, or poultry.  Dairy Plain yogurt, kefir, and unsweetened liquid yogurt. Lactose-free milk, buttermilk, or soy milk. Plain hard cheese. Beverages Sport drinks. Clear broths. Diluted fruit juices (except prune). Regular, caffeine-free sodas such as ginger ale. Water. Decaffeinated teas. Oral rehydration solutions. Sugar-free beverages not sweetened with sugar alcohols. Other Bouillon, broth, or soups made from recommended foods.  The items listed above may not be a complete list of recommended foods or beverages. Contact your dietitian for more options. WHAT FOODS ARE NOT RECOMMENDED? Grains Whole grain, whole wheat, bran, or rye breads, rolls, pastas, crackers, and cereals. Wild or brown rice. Cereals that contain more than 2 g of fiber per serving. Corn tortillas or taco shells. Cooked or dry oatmeal. Granola. Popcorn. Vegetables Raw vegetables. Cabbage, broccoli, Brussels sprouts, artichokes, baked beans,  beet greens, corn, kale, legumes, peas, sweet potatoes, and yams.  Potato skins. Cooked spinach and cabbage. Fruits Dried fruit, including raisins and dates. Raw fruits. Stewed or dried prunes. Fresh apples with skin, apricots, mangoes, pears, raspberries, and strawberries.  Meat and Other Protein Products Chunky peanut butter. Nuts and seeds. Beans and lentils. Berniece Salines.  Dairy High-fat cheeses. Milk, chocolate milk, and beverages made with milk, such as milk shakes. Cream. Ice cream. Sweets and Desserts Sweet rolls, doughnuts, and sweet breads. Pancakes and waffles. Fats and Oils Butter. Cream sauces. Margarine. Salad oils. Plain salad dressings. Olives. Avocados.  Beverages Caffeinated beverages (such as coffee, tea, soda, or energy drinks). Alcoholic beverages. Fruit juices with pulp. Prune juice. Soft drinks sweetened with high-fructose corn syrup or sugar alcohols. Other Coconut. Hot sauce. Chili powder. Mayonnaise. Gravy. Cream-based or milk-based soups.  The items listed above may not be a complete list of foods and beverages to avoid. Contact your dietitian for more information. WHAT SHOULD I DO IF I BECOME DEHYDRATED? Diarrhea can sometimes lead to dehydration. Signs of dehydration include dark urine and dry mouth and skin. If you think you are dehydrated, you should rehydrate with an oral rehydration solution. These solutions can be purchased at pharmacies, retail stores, or online.  Drink -1 cup (120-240 mL) of oral rehydration solution each time you have an episode of diarrhea. If drinking this amount makes your diarrhea worse, try drinking smaller amounts more often. For example, drink 1-3 tsp (5-15 mL) every 5-10 minutes.  A general rule for staying hydrated is to drink 1-2 L of fluid per day. Talk to your health care provider about the specific amount you should be drinking each day. Drink enough fluids to keep your urine clear or pale yellow. Document Released: 05/29/2003 Document Revised: 03/13/2013 Document Reviewed: 01/29/2013 [facility]  Patient Information 2015 [place], [place]. This information is not intended to replace advice given to you by your health care provider. Make sure you discuss any questions you have with your health care provider.   Nausea and Vomiting Nausea is a sick feeling that often comes before throwing up (vomiting). Vomiting is a reflex where stomach contents come out of your mouth. Vomiting can cause severe loss of body fluids (dehydration). Children and elderly adults can become dehydrated quickly, especially if they also have diarrhea. Nausea and vomiting are symptoms of a condition or disease. It is important to find the cause of your symptoms. CAUSES   Direct irritation of the stomach lining. This irritation can result from increased acid production (gastroesophageal reflux disease), infection, food poisoning, taking certain medicines (such as nonsteroidal anti-inflammatory drugs), alcohol use, or tobacco use.  Signals from the brain.These signals could be caused by a headache, heat exposure, an inner ear disturbance, increased pressure in the brain from injury, infection, a tumor, or a concussion, pain, emotional stimulus, or metabolic problems.  An obstruction in the gastrointestinal tract (bowel obstruction).  Illnesses such as diabetes, hepatitis, gallbladder problems, appendicitis, kidney problems, cancer, sepsis, atypical symptoms of a heart attack, or eating disorders.  Medical treatments such as chemotherapy and radiation.  Receiving medicine that makes you sleep (general anesthetic) during surgery. DIAGNOSIS Your caregiver may ask for tests to be done if the problems do not improve after a few days. Tests may also be done if symptoms are severe or if the reason for the nausea and vomiting is not clear. Tests may include:  Urine tests.  Blood tests.  Stool tests.  Cultures (to look for evidence  of infection).  X-rays or other imaging studies. Test results can help your caregiver make  decisions about treatment or the need for additional tests. TREATMENT You need to stay well hydrated. Drink frequently but in small amounts.You may wish to drink water, sports drinks, clear broth, or eat frozen ice pops or gelatin dessert to help stay hydrated.When you eat, eating slowly may help prevent nausea.There are also some antinausea medicines that may help prevent nausea. HOME CARE INSTRUCTIONS   Take all medicine as directed by your caregiver.  If you do not have an appetite, do not force yourself to eat. However, you must continue to drink fluids.  If you have an appetite, eat a normal diet unless your caregiver tells you differently.  Eat a variety of complex carbohydrates (rice, wheat, potatoes, bread), lean meats, yogurt, fruits, and vegetables.  Avoid high-fat foods because they are more difficult to digest.  Drink enough water and fluids to keep your urine clear or pale yellow.  If you are dehydrated, ask your caregiver for specific rehydration instructions. Signs of dehydration may include:  Severe thirst.  Dry lips and mouth.  Dizziness.  Dark urine.  Decreasing urine frequency and amount.  Confusion.  Rapid breathing or pulse. SEEK IMMEDIATE MEDICAL CARE IF:   You have blood or brown flecks (like coffee grounds) in your vomit.  You have black or bloody stools.  You have a severe headache or stiff neck.  You are confused.  You have severe abdominal pain.  You have chest pain or trouble breathing.  You do not urinate at least once every 8 hours.  You develop cold or clammy skin.  You continue to vomit for longer than 24 to 48 hours.  You have a fever. MAKE SURE YOU:   Understand these instructions.  Will watch your condition.  Will get help right away if you are not doing well or get worse. Document Released: 03/08/2005 Document Revised: 05/31/2011 Document Reviewed: 08/05/2010 [facility] Patient Information 2015 [place], [place]. This  information is not intended to replace advice given to you by your health care provider. Make sure you discuss any questions you have with your health care provider.  Viral Gastroenteritis Viral gastroenteritis is also known as stomach flu. This condition affects the stomach and intestinal tract. It can cause sudden diarrhea and vomiting. The illness typically lasts 3 to 8 days. Most people develop an immune response that eventually gets rid of the virus. While this natural response develops, the virus can make you quite ill. CAUSES  Many different viruses can cause gastroenteritis, such as rotavirus or noroviruses. You can catch one of these viruses by consuming contaminated food or water. You may also catch a virus by sharing utensils or other personal items with an infected person or by touching a contaminated surface. SYMPTOMS  The most common symptoms are diarrhea and vomiting. These problems can cause a severe loss of body fluids (dehydration) and a body salt (electrolyte) imbalance. Other symptoms may include:  Fever.  Headache.  Fatigue.  Abdominal pain. DIAGNOSIS  Your caregiver can usually diagnose viral gastroenteritis based on your symptoms and a physical exam. A stool sample may also be taken to test for the presence of viruses or other infections. TREATMENT  This illness typically goes away on its own. Treatments are aimed at rehydration. The most serious cases of viral gastroenteritis involve vomiting so severely that you are not able to keep fluids down. In these cases, fluids must be given through an intravenous  line (IV). HOME CARE INSTRUCTIONS   Drink enough fluids to keep your urine clear or pale yellow. Drink small amounts of fluids frequently and increase the amounts as tolerated.  Ask your caregiver for specific rehydration instructions.  Avoid:  Foods high in sugar.  Alcohol.  Carbonated drinks.  Tobacco.  Juice.  Caffeine drinks.  Extremely hot or cold  fluids.  Fatty, greasy foods.  Too much intake of anything at one time.  Dairy products until 24 to 48 hours after diarrhea stops.  You may consume probiotics. Probiotics are active cultures of beneficial bacteria. They may lessen the amount and number of diarrheal stools in adults. Probiotics can be found in yogurt with active cultures and in supplements.  Wash your hands well to avoid spreading the virus.  Only take over-the-counter or prescription medicines for pain, discomfort, or fever as directed by your caregiver. Do not give aspirin to children. Antidiarrheal medicines are not recommended.  Ask your caregiver if you should continue to take your regular prescribed and over-the-counter medicines.  Keep all follow-up appointments as directed by your caregiver. SEEK IMMEDIATE MEDICAL CARE IF:   You are unable to keep fluids down.  You do not urinate at least once every 6 to 8 hours.  You develop shortness of breath.  You notice blood in your stool or vomit. This may look like coffee grounds.  You have abdominal pain that increases or is concentrated in one small area (localized).  You have persistent vomiting or diarrhea.  You have a fever.  The patient is a child younger than 3 months, and he or she has a fever.  The patient is a child older than 3 months, and he or she has a fever and persistent symptoms.  The patient is a child older than 3 months, and he or she has a fever and symptoms suddenly get worse.  The patient is a baby, and he or she has no tears when crying. MAKE SURE YOU:   Understand these instructions.  Will watch your condition.  Will get help right away if you are not doing well or get worse. Document Released: 03/08/2005 Document Revised: 05/31/2011 Document Reviewed: 12/23/2010 [facility] Patient Information 2015 [place], [place]. This information is not intended to replace advice given to you by your health care provider. Make sure you discuss  any questions you have with your health care provider.  Diarrhea Diarrhea is frequent loose and watery bowel movements. It can cause you to feel weak and dehydrated. Dehydration can cause you to become tired and thirsty, have a dry mouth, and have decreased urination that often is dark yellow. Diarrhea is a sign of another problem, most often an infection that will not last long. In most cases, diarrhea typically lasts 2-3 days. However, it can last longer if it is a sign of something more serious. It is important to treat your diarrhea as directed by your caregiver to lessen or prevent future episodes of diarrhea. CAUSES  Some common causes include:  Gastrointestinal infections caused by viruses, bacteria, or parasites.  Food poisoning or food allergies.  Certain medicines, such as antibiotics, chemotherapy, and laxatives.  Artificial sweeteners and fructose.  Digestive disorders. HOME CARE INSTRUCTIONS  Ensure adequate fluid intake (hydration): Have 1 cup (8 oz) of fluid for each diarrhea episode. Avoid fluids that contain simple sugars or sports drinks, fruit juices, whole milk products, and sodas. Your urine should be clear or pale yellow if you are drinking enough fluids. Hydrate with an  oral rehydration solution that you can purchase at pharmacies, retail stores, and online. You can prepare an oral rehydration solution at home by mixing the following ingredients together:   - tsp table salt.   tsp baking soda.   tsp salt substitute containing potassium chloride.  1  tablespoons sugar.  1 L (34 oz) of water.  Certain foods and beverages may increase the speed at which food moves through the gastrointestinal (GI) tract. These foods and beverages should be avoided and include:  Caffeinated and alcoholic beverages.  High-fiber foods, such as raw fruits and vegetables, nuts, seeds, and whole grain breads and cereals.  Foods and beverages sweetened with sugar alcohols, such as  xylitol, sorbitol, and mannitol.  Some foods may be well tolerated and may help thicken stool including:  Starchy foods, such as rice, toast, pasta, low-sugar cereal, oatmeal, grits, baked potatoes, crackers, and bagels.  Bananas.  Applesauce.  Add probiotic-rich foods to help increase healthy bacteria in the GI tract, such as yogurt and fermented milk products.  Wash your hands well after each diarrhea episode.  Only take over-the-counter or prescription medicines as directed by your caregiver.  Take a warm bath to relieve any burning or pain from frequent diarrhea episodes. SEEK IMMEDIATE MEDICAL CARE IF:   You are unable to keep fluids down.  You have persistent vomiting.  You have blood in your stool, or your stools are black and tarry.  You do not urinate in 6-8 hours, or there is only a small amount of very dark urine.  You have abdominal pain that increases or localizes.  You have weakness, dizziness, confusion, or light-headedness.  You have a severe headache.  Your diarrhea gets worse or does not get better.  You have a fever or persistent symptoms for more than 2-3 days.  You have a fever and your symptoms suddenly get worse. MAKE SURE YOU:   Understand these instructions.  Will watch your condition.  Will get help right away if you are not doing well or get worse. Document Released: 02/26/2002 Document Revised: 07/23/2013 Document Reviewed: 11/14/2011 [facility] Patient Information 2015 [place], [place]. This information is not intended to replace advice given to you by your health care provider. Make sure you discuss any questions you have with your health care provider.

## 2014-06-16 NOTE — ED Notes (Signed)
Pt reports nausea/vomiting and diarrhea this morning. Last period December 27th. Denies abdominal pain. Abdomen soft and non tender. Bowel sounds present all quadrants.

## 2014-06-16 NOTE — ED Provider Notes (Signed)
CSN: [id number]     Arrival date & time 06/16/14  6789 History   First MD Initiated Contact with Patient 06/16/14 0815     Chief Complaint  Patient presents with  . Emesis  . Diarrhea     (Consider location/radiation/quality/duration/timing/severity/associated sxs/prior Treatment) HPI Comments: Annette Greene is a 24 y.o. female who presents to the ED with complaints of nausea, vomiting, and diarrhea that began this morning at 5 AM upon awakening. She states she had 2 episodes of nonbloody nonbilious emesis consisting of clear liquids, and 3 episodes of looser than normal stools without any watery or mucoid consistencies, no melena or hematochezia. She reports some mild suprapubic pressure which she states is a 4/10, intermittent, nonradiating, with no known aggravating or alleviating factors given that she has not tried anything prior to arrival. She states that she consumes alcohol every day or every other day, and admits to drinking "a few shots of liquor" last night. She recently started a new job at a [employer], but denies any known sick contacts. She is sexually active with one female partner, unprotected. She was recently seen on 06/03/14 for similar symptoms, although she had vaginal discharge at that point, and was diagnosed with trichomoniasis. She denies fevers, chills, CP, SOB, constipation, obstipation, melena, hematochezia, hematemesis, dysuria, hematuria, urinary frequency, vaginal bleeding or discharge, flank pain, numbness, tingling, weakness, recent travel, sick contacts, suspicious food intake, or recent abx. Last sexual activity was 4 days ago. Denies NSAID use or prior abd surgeries. LMP 03/17/14, hx of irregular menses. Upreg on 06/03/14 was negative.  Patient is a 24 y.o. female presenting with vomiting and diarrhea. The history is provided by the patient. No language interpreter was used.  Emesis Severity:  Mild Duration:  3 hours Timing:  Constant Number of daily  episodes:  2x this morning Quality:  Stomach contents Progression:  Unchanged Chronicity:  New Relieved by:  Nothing Worsened by:  Nothing tried Ineffective treatments:  None tried Associated symptoms: abdominal pain (mild suprapubic pressure) and diarrhea   Associated symptoms: no arthralgias, no chills, no fever, no myalgias, no sore throat and no URI   Abdominal pain:    Location:  Suprapubic   Quality:  Pressure   Severity:  Mild   Onset quality:  Gradual   Duration:  3 hours   Timing:  Intermittent   Progression:  Unchanged   Chronicity:  New Risk factors: alcohol use   Risk factors: no prior abdominal surgery, no sick contacts, no suspect food intake and no travel to endemic areas   Diarrhea Quality:  Semi-solid Severity:  Mild Onset quality:  Sudden Number of episodes:  3x this morning Duration:  3 hours Timing:  Constant Progression:  Unchanged Relieved by:  None tried Worsened by:  Nothing tried Ineffective treatments:  None tried Associated symptoms: abdominal pain (mild suprapubic pressure) and vomiting   Associated symptoms: no arthralgias, no chills, no fever, no myalgias and no URI   Risk factors: no recent antibiotic use, no sick contacts, no suspicious food intake and no travel to endemic areas     No past medical history on file. Past Surgical History  Procedure Laterality Date  . Tonsillectomy     Family History  Problem Relation Age of Onset  . Healthy Mother   . Healthy Father   . Healthy Sister   . Healthy Maternal Grandmother   . Healthy Paternal Grandmother   . Healthy Paternal Grandfather   . Healthy Sister  History  Substance Use Topics  . Smoking status: Current Every Day Smoker  . Smokeless tobacco: Not on file  . Alcohol Use: Yes     Comment: occasional    OB History    No data available     Review of Systems  Constitutional: Negative for fever and chills.  HENT: Negative for sore throat.   Respiratory: Negative for  shortness of breath.   Cardiovascular: Negative for chest pain.  Gastrointestinal: Positive for nausea, vomiting, abdominal pain (mild suprapubic pressure) and diarrhea. Negative for constipation and blood in stool.  Genitourinary: Negative for dysuria, hematuria, flank pain, vaginal bleeding, vaginal discharge and pelvic pain.  Musculoskeletal: Negative for myalgias, back pain and arthralgias.  Skin: Negative for color change.  Allergic/Immunologic: Negative for immunocompromised state.  Neurological: Negative for weakness and numbness.  Psychiatric/Behavioral: Negative for confusion.   10 Systems reviewed and are negative for acute change except as noted in the HPI.    Allergies  Review of patient's allergies indicates no known allergies.  Home Medications   Prior to Admission medications   Medication Sig Start Date End Date Taking? Authorizing Provider  ondansetron (ZOFRAN) 8 MG tablet Take 1 tablet (8 mg total) by mouth every 8 (eight) hours as needed for nausea or vomiting. 12/18/13   Gregor Hams, MD   BP 139/61 mmHg  Pulse 62  Temp(Src) 98.2 F (36.8 C) (Oral)  Resp 18  Ht 5\' 7"  (1.702 m)  Wt 270 lb (122.471 kg)  BMI 42.28 kg/m2  SpO2 100%  LMP 03/17/2014 Physical Exam  Constitutional: She is oriented to person, place, and time. Vital signs are normal. She appears well-developed and well-nourished.  Non-toxic appearance. No distress.  Afebrile, nontoxic, NAD, obese  HENT:  Head: Normocephalic and atraumatic.  Mouth/Throat: Oropharynx is clear and moist and mucous membranes are normal.  Eyes: Conjunctivae and EOM are normal. Right eye exhibits no discharge. Left eye exhibits no discharge.  Neck: Normal range of motion. Neck supple.  Cardiovascular: Normal rate, regular rhythm, normal heart sounds and intact distal pulses.  Exam reveals no gallop and no friction rub.   No murmur heard. Pulmonary/Chest: Effort normal and breath sounds normal. No respiratory distress. She  has no decreased breath sounds. She has no wheezes. She has no rhonchi. She has no rales.  Abdominal: Soft. Normal appearance and bowel sounds are normal. She exhibits no distension. There is no tenderness. There is no rigidity, no rebound, no guarding, no CVA tenderness, no tenderness at McBurney's point and negative Murphy's sign.  Soft, obese, NTND, +BS throughout, no r/g/r, neg murphy's, neg mcburney's, no CVA TTP   Musculoskeletal: Normal range of motion.  MAE x4, strength and sensation grossly intact  Neurological: She is alert and oriented to person, place, and time. She has normal strength. No sensory deficit.  Skin: Skin is warm, dry and intact. No rash noted.  Psychiatric: She has a normal mood and affect.  Nursing note and vitals reviewed.   ED Course  Procedures (including critical care time) Labs Review Labs Reviewed  CBC WITH DIFFERENTIAL/PLATELET - Abnormal; Notable for the following:    Hemoglobin 10.4 (*)    HCT 33.3 (*)    MCH 24.9 (*)    All other components within normal limits  COMPREHENSIVE METABOLIC PANEL - Abnormal; Notable for the following:    Glucose, Bld 109 (*)    Albumin 3.2 (*)    Total Bilirubin 0.1 (*)    All other components within normal  limits  URINALYSIS, ROUTINE W REFLEX MICROSCOPIC - Abnormal; Notable for the following:    Color, Urine AMBER (*)    Specific Gravity, Urine 1.034 (*)    Hgb urine dipstick SMALL (*)    All other components within normal limits  URINE MICROSCOPIC-ADD ON  POC URINE PREG, ED    Imaging Review No results found.   EKG Interpretation None      MDM   Final diagnoses:  Nausea vomiting and diarrhea  Gastroenteritis    24 y.o. female here with N/V/D that occurred this morning after drinking alcohol last night, some slight lower abdomen "pressure" but nonperitoneal abd exam without tenderness, +bowel sounds throughout. Likely viral gastroenteritis vs alcohol induced gastritis. Pt was just seen on 3/14 for  vaginal discharge and lower abd cramping, diagnosed with trichomoniasis but HIV/RPR/GC/CT negative. She was treated the day she was seen. Given no vaginal complaints and no pelvic tenderness, doubt need for repeat pelvic exam. Will get basic labs, give zofran and PO challenge. Doubt need for imaging. Will reassess shortly.   10:20 AM Upreg neg. U/A without findings of UTI. CBC showing chronic anemia, CMP WNL. Tolerating PO well. Likely gastroenteritis vs alcohol induced GI upset. Discussed alcohol cessation. Discussed use of zofran as needed. Will have her establish care at [facility] for ongoing management of her chronic illnesses, and recheck of today's visit. I explained the diagnosis and have given explicit precautions to return to the ER including for any other new or worsening symptoms. The patient understands and accepts the medical plan as it's been dictated and I have answered their questions. Discharge instructions concerning home care and prescriptions have been given. The patient is STABLE and is discharged to home in good condition.  BP 139/61 mmHg  Pulse 62  Temp(Src) 98.2 F (36.8 C) (Oral)  Resp 18  Ht 5\' 7"  (1.702 m)  Wt 270 lb (122.471 kg)  BMI 42.28 kg/m2  SpO2 100%  LMP 03/17/2014  Meds ordered this encounter  Medications  . ondansetron (ZOFRAN-ODT) disintegrating tablet 8 mg    Sig:   . ondansetron (ZOFRAN) 8 MG tablet    Sig: Take 1 tablet (8 mg total) by mouth every 8 (eight) hours as needed for nausea or vomiting.    Dispense:  10 tablet    Refill:  0    Order Specific Question:  Supervising Provider    Answer:  Noemi Chapel [[id number]]     Santasia Rew Camprubi-Soms, PA-C 06/16/14 1021  Elnora Morrison, MD 06/17/14 [phone]

## 2014-07-25 ENCOUNTER — Emergency Department (HOSPITAL_BASED_OUTPATIENT_CLINIC_OR_DEPARTMENT_OTHER)
Admission: EM | Admit: 2014-07-25 | Discharge: 2014-07-26 | Disposition: A | Discharge status: Home / Self Care | Payer: Self-pay | Attending: Emergency Medicine | Admitting: Emergency Medicine

## 2014-07-25 ENCOUNTER — Encounter (HOSPITAL_BASED_OUTPATIENT_CLINIC_OR_DEPARTMENT_OTHER): Payer: Self-pay | Admitting: *Deleted

## 2014-07-25 DIAGNOSIS — N938 Other specified abnormal uterine and vaginal bleeding: Secondary | ICD-10-CM | POA: Insufficient documentation

## 2014-07-25 DIAGNOSIS — Z3202 Encounter for pregnancy test, result negative: Secondary | ICD-10-CM | POA: Insufficient documentation

## 2014-07-25 DIAGNOSIS — E669 Obesity, unspecified: Secondary | ICD-10-CM | POA: Insufficient documentation

## 2014-07-25 DIAGNOSIS — Z720 Tobacco use: Secondary | ICD-10-CM | POA: Insufficient documentation

## 2014-07-25 DIAGNOSIS — M545 Low back pain, unspecified: Secondary | ICD-10-CM

## 2014-07-25 LAB — URINE MICROSCOPIC-ADD ON

## 2014-07-25 LAB — URINALYSIS, ROUTINE W REFLEX MICROSCOPIC
Bilirubin Urine: NEGATIVE
Glucose, UA: NEGATIVE mg/dL
Ketones, ur: NEGATIVE mg/dL
LEUKOCYTES UA: NEGATIVE
NITRITE: NEGATIVE
Protein, ur: NEGATIVE mg/dL
SPECIFIC GRAVITY, URINE: 1.028 (ref 1.005–1.030)
Urobilinogen, UA: 1 mg/dL (ref 0.0–1.0)
pH: 6 (ref 5.0–8.0)

## 2014-07-25 LAB — PREGNANCY, URINE: PREG TEST UR: NEGATIVE

## 2014-07-25 MED ORDER — OXYCODONE-ACETAMINOPHEN 5-325 MG PO TABS
1.0000 | ORAL_TABLET | Freq: Once | ORAL | Status: AC
  Administered 2014-07-26: 1 via ORAL
  Filled 2014-07-25: qty 1

## 2014-07-25 MED ORDER — IBUPROFEN 800 MG PO TABS
800.0000 mg | ORAL_TABLET | Freq: Once | ORAL | Status: AC
  Administered 2014-07-26: 800 mg via ORAL
  Filled 2014-07-25: qty 1

## 2014-07-25 NOTE — ED Provider Notes (Signed)
CSN: [id number]     Arrival date & time 07/25/14  2143 History  This chart was scribed for Annette Hacker, MD by Rayfield Citizen, ED Scribe. This patient was seen in room MH03/MH03 and the patient's care was started at 11:54 PM.    Chief Complaint  Patient presents with  . Back Pain   Patient is a 24 y.o. female presenting with back pain. The history is provided by the patient. No language interpreter was used.  Back Pain Associated symptoms: no abdominal pain, no chest pain, no dysuria, no fever, no headaches, no numbness and no weakness      HPI Comments: Annette Greene is an otherwise healthy 24 y.o. female who presents to the Emergency Department complaining of "dull, occasionally sharp" constant, waxing and waning lower back pain (L>R) beginning yesterday, rated 10/10 at present. Patient describes this pain radiating down her left leg and around to her groin; it increases with certain positions and applied pressure to the area, but improves when walking. States that she feels like she slept on it wrong.  She also reports vaginal bleeding since March 2016. She denies dysuria, hematuria, weakness, numbness or paresthesias in her lower extremities, bowel or bladder incontinence. She denies history of kidney stones.   She denies any daily medications. No known drug allergies.   History reviewed. No pertinent past medical history. Past Surgical History  Procedure Laterality Date  . Tonsillectomy     Family History  Problem Relation Age of Onset  . Healthy Mother   . Healthy Father   . Healthy Sister   . Healthy Maternal Grandmother   . Healthy Paternal Grandmother   . Healthy Paternal Grandfather   . Healthy Sister    History  Substance Use Topics  . Smoking status: Current Every Day Smoker    Types: Cigarettes  . Smokeless tobacco: Not on file  . Alcohol Use: Yes   OB History    No data available     Review of Systems  Constitutional: Negative for fever.  Respiratory:  Negative for chest tightness and shortness of breath.   Cardiovascular: Negative for chest pain.  Gastrointestinal: Negative for nausea, vomiting and abdominal pain.  Genitourinary: Negative for dysuria, hematuria and difficulty urinating.  Musculoskeletal: Positive for back pain.  Neurological: Negative for weakness, numbness and headaches.  All other systems reviewed and are negative.     Allergies  Review of patient's allergies indicates no known allergies.  Home Medications   Prior to Admission medications   Medication Sig Start Date End Date Taking? Authorizing Provider  ibuprofen (ADVIL,MOTRIN) 800 MG tablet Take 1 tablet (800 mg total) by mouth every 8 (eight) hours as needed for moderate pain. 07/26/14   Annette Hacker, MD  ondansetron (ZOFRAN) 8 MG tablet Take 1 tablet (8 mg total) by mouth every 8 (eight) hours as needed for nausea or vomiting. 12/18/13   Gregor Hams, MD  ondansetron (ZOFRAN) 8 MG tablet Take 1 tablet (8 mg total) by mouth every 8 (eight) hours as needed for nausea or vomiting. 06/16/14   Mercedes Camprubi-Soms, PA-C  oxyCODONE-acetaminophen (PERCOCET/ROXICET) 5-325 MG per tablet Take 1 tablet by mouth every 6 (six) hours as needed for severe pain. 07/26/14   Annette Hacker, MD   BP 116/60 mmHg  Pulse 83  Temp(Src) 98.3 F (36.8 C) (Oral)  Resp 18  Ht 5\' 7"  (1.702 m)  Wt 270 lb (122.471 kg)  BMI 42.28 kg/m2  SpO2 100%  LMP  05/25/2014 Physical Exam  Constitutional: She is oriented to person, place, and time. She appears well-developed and well-nourished.  Obese  HENT:  Head: Normocephalic and atraumatic.  Cardiovascular: Normal rate, regular rhythm and normal heart sounds.   Pulmonary/Chest: Effort normal. No respiratory distress. She has no wheezes.  Abdominal: Soft. Bowel sounds are normal. There is no tenderness. There is no rebound.  Musculoskeletal:  Tenderness to palpation over the left lower paraspinous muscle region of the lumbar spine, no  midline step-offs or deformities  Neurological: She is alert and oriented to person, place, and time.  Normal symmetric reflexes bilaterally of the lower extremities, 5 out of 5 strength in all 4 extremities  Skin: Skin is warm and dry.  Psychiatric: She has a normal mood and affect.  Nursing note and vitals reviewed.   ED Course  Procedures   DIAGNOSTIC STUDIES: Oxygen Saturation is 100% on RA, normal by my interpretation.    COORDINATION OF CARE: 12:00 AM Discussed treatment plan with pt at bedside and pt agreed to plan.   Labs Review Labs Reviewed  URINALYSIS, ROUTINE W REFLEX MICROSCOPIC - Abnormal; Notable for the following:    APPearance CLOUDY (*)    Hgb urine dipstick LARGE (*)    All other components within normal limits  URINE MICROSCOPIC-ADD ON - Abnormal; Notable for the following:    Squamous Epithelial / LPF FEW (*)    Bacteria, UA MANY (*)    All other components within normal limits  CBC - Abnormal; Notable for the following:    Hemoglobin 11.3 (*)    HCT 35.1 (*)    MCV 77.7 (*)    MCH 25.0 (*)    RDW 16.2 (*)    All other components within normal limits  PREGNANCY, URINE    Imaging Review No results found.   EKG Interpretation None      MDM   Final diagnoses:  Left-sided low back pain without sciatica   Patient presents with left-sided back pain. Nontoxic on exam. Nonfocal. No red flags of back pain. Pain is reproducible suggesting musculoskeletal etiology. Kidney stones are also a consideration. Patient given ibuprofen and Percocet with some improvement of pain. Patient also reports chronic vaginal bleeding. Denies any dizziness. Patient is not pregnant. CBC obtained to evaluate for anemia. Hemoglobin is 11.3 up from 10.4 one month ago.  Discuss with patient PCP follow-up for definitive workup and management. No indication for emergent evaluation at this time. Discussed with patient that I felt her back pain was likely musculoskeletal nature. She  develops any new or worsening symptoms, she should return for further evaluation.  After history, exam, and medical workup I feel the patient has been appropriately medically screened and is safe for discharge home. Pertinent diagnoses were discussed with the patient. Patient was given return precautions.  I personally performed the services described in this documentation, which was scribed in my presence. The recorded information has been reviewed and is accurate.      Annette Hacker, MD 07/26/14 [phone]

## 2014-07-25 NOTE — ED Notes (Signed)
Woke with pain in her lower back. Vaginal bleeding since March.

## 2014-07-26 LAB — CBC
HCT: 35.1 % — ABNORMAL LOW (ref 36.0–46.0)
Hemoglobin: 11.3 g/dL — ABNORMAL LOW (ref 12.0–15.0)
MCH: 25 pg — ABNORMAL LOW (ref 26.0–34.0)
MCHC: 32.2 g/dL (ref 30.0–36.0)
MCV: 77.7 fL — ABNORMAL LOW (ref 78.0–100.0)
PLATELETS: 310 10*3/uL (ref 150–400)
RBC: 4.52 MIL/uL (ref 3.87–5.11)
RDW: 16.2 % — ABNORMAL HIGH (ref 11.5–15.5)
WBC: 8 10*3/uL (ref 4.0–10.5)

## 2014-07-26 MED ORDER — IBUPROFEN 800 MG PO TABS: 800.0000 mg | ORAL_TABLET | Freq: Three times a day (TID) | ORAL | Status: DC | PRN

## 2014-07-26 MED ORDER — OXYCODONE-ACETAMINOPHEN 5-325 MG PO TABS: 1.0000 | ORAL_TABLET | Freq: Four times a day (QID) | ORAL | Status: DC | PRN

## 2014-07-26 NOTE — Discharge Instructions (Signed)
You were seen today for back pain. Your back pain appears to be musculoskeletal in nature. Another consideration includes kidney stones; however there are features of your pains that are not consistent with kidney stones and your workup is otherwise unremarkable. If you have any new or worsening symptoms, worsening of pain, onset of nausea or vomiting he be reassessed may need a CT scan. You also endorses chronic vaginal bleeding. Your hemoglobin is stable. You need to follow-up with primary physician for further workup. If you develop worsening vaginal bleeding, bleeding greater than 1 pad an hour, dizziness or any other new or worsening symptoms she should be reevaluated more immediately.  Back Pain, Adult Low back pain is very common. About 1 in 5 people have back pain.The cause of low back pain is rarely dangerous. The pain often gets better over time.About half of people with a sudden onset of back pain feel better in just 2 weeks. About 8 in 10 people feel better by 6 weeks.  CAUSES Some common causes of back pain include:  Strain of the muscles or ligaments supporting the spine.  Wear and tear (degeneration) of the spinal discs.  Arthritis.  Direct injury to the back. DIAGNOSIS Most of the time, the direct cause of low back pain is not known.However, back pain can be treated effectively even when the exact cause of the pain is unknown.Answering your caregiver's questions about your overall health and symptoms is one of the most accurate ways to make sure the cause of your pain is not dangerous. If your caregiver needs more information, he or she may order lab work or imaging tests (X-rays or MRIs).However, even if imaging tests show changes in your back, this usually does not require surgery. HOME CARE INSTRUCTIONS For many people, back pain returns.Since low back pain is rarely dangerous, it is often a condition that people can learn to [facility] their own.   Remain active. It is  stressful on the back to sit or stand in one place. Do not sit, drive, or stand in one place for more than 30 minutes at a time. Take short walks on level surfaces as soon as pain allows.Try to increase the length of time you walk each day.  Do not stay in bed.Resting more than 1 or 2 days can delay your recovery.  Do not avoid exercise or work.Your body is made to move.It is not dangerous to be active, even though your back may hurt.Your back will likely heal faster if you return to being active before your pain is gone.  Pay attention to your body when you bend and lift. Many people have less discomfortwhen lifting if they bend their knees, keep the load close to their bodies,and avoid twisting. Often, the most comfortable positions are those that put less stress on your recovering back.  Find a comfortable position to sleep. Use a firm mattress and lie on your side with your knees slightly bent. If you lie on your back, put a pillow under your knees.  Only take over-the-counter or prescription medicines as directed by your caregiver. Over-the-counter medicines to reduce pain and inflammation are often the most helpful.Your caregiver may prescribe muscle relaxant drugs.These medicines help dull your pain so you can more quickly return to your normal activities and healthy exercise.  Put ice on the injured area.  Put ice in a plastic bag.  Place a towel between your skin and the bag.  Leave the ice on for 15-20 minutes, 03-04 times  a day for the first 2 to 3 days. After that, ice and heat may be alternated to reduce pain and spasms.  Ask your caregiver about trying back exercises and gentle massage. This may be of some benefit.  Avoid feeling anxious or stressed.Stress increases muscle tension and can worsen back pain.It is important to recognize when you are anxious or stressed and learn ways to manage it.Exercise is a great option. SEEK MEDICAL CARE IF:  You have pain that is  not relieved with rest or medicine.  You have pain that does not improve in 1 week.  You have new symptoms.  You are generally not feeling well. SEEK IMMEDIATE MEDICAL CARE IF:   You have pain that radiates from your back into your legs.  You develop new bowel or bladder control problems.  You have unusual weakness or numbness in your arms or legs.  You develop nausea or vomiting.  You develop abdominal pain.  You feel faint. Document Released: 03/08/2005 Document Revised: 09/07/2011 Document Reviewed: 07/10/2013 [facility] Patient Information 2015 [place], [place]. This information is not intended to replace advice given to you by your health care provider. Make sure you discuss any questions you have with your health care provider.

## 2014-11-18 ENCOUNTER — Emergency Department (HOSPITAL_BASED_OUTPATIENT_CLINIC_OR_DEPARTMENT_OTHER)
Admission: EM | Admit: 2014-11-18 | Discharge: 2014-11-18 | Disposition: A | Discharge status: Home / Self Care | Payer: Self-pay | Attending: Emergency Medicine | Admitting: Emergency Medicine

## 2014-11-18 ENCOUNTER — Encounter (HOSPITAL_BASED_OUTPATIENT_CLINIC_OR_DEPARTMENT_OTHER): Payer: Self-pay | Admitting: *Deleted

## 2014-11-18 DIAGNOSIS — Z3202 Encounter for pregnancy test, result negative: Secondary | ICD-10-CM | POA: Insufficient documentation

## 2014-11-18 DIAGNOSIS — M546 Pain in thoracic spine: Secondary | ICD-10-CM | POA: Insufficient documentation

## 2014-11-18 DIAGNOSIS — M545 Low back pain: Secondary | ICD-10-CM | POA: Insufficient documentation

## 2014-11-18 DIAGNOSIS — Z720 Tobacco use: Secondary | ICD-10-CM | POA: Insufficient documentation

## 2014-11-18 LAB — URINALYSIS, ROUTINE W REFLEX MICROSCOPIC
Bilirubin Urine: NEGATIVE
Glucose, UA: NEGATIVE mg/dL
Hgb urine dipstick: NEGATIVE
Ketones, ur: 40 mg/dL — AB
Nitrite: NEGATIVE
PH: 6 (ref 5.0–8.0)
Protein, ur: NEGATIVE mg/dL
Specific Gravity, Urine: 1.029 (ref 1.005–1.030)
Urobilinogen, UA: 0.2 mg/dL (ref 0.0–1.0)

## 2014-11-18 LAB — URINE MICROSCOPIC-ADD ON

## 2014-11-18 LAB — PREGNANCY, URINE: Preg Test, Ur: NEGATIVE

## 2014-11-18 MED ORDER — IBUPROFEN 200 MG PO TABS
600.0000 mg | ORAL_TABLET | Freq: Once | ORAL | Status: AC
  Administered 2014-11-18: 600 mg via ORAL
  Filled 2014-11-18 (×2): qty 1

## 2014-11-18 MED ORDER — CYCLOBENZAPRINE HCL 5 MG PO TABS: 5.0000 mg | ORAL_TABLET | Freq: Three times a day (TID) | ORAL | Status: DC | PRN

## 2014-11-18 NOTE — ED Provider Notes (Signed)
CSN: [id number]     Arrival date & time 11/18/14  1617 History   First MD Initiated Contact with Patient 11/18/14 1620     Chief Complaint  Patient presents with  . Back Pain   HPI   24 year old female presents today with back pain. Patient reports a history of the same, has been seen numerous times for similar presentation. Patient reports over the last 3 weeks she's had intermittent back " spasm" that lasts a couple minutes and then goes away. She reports this happens approximately 3 times a day. Patient reports she attempted using ibuprofen "once" in her relief symptoms but only lasted several hours. 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 She denies any headache, neck stiffness, fever, chills, abdominal pain, cramping, urinary frequency, changes in color clarity or characteristics of her urine or stool. She denies any distal involvement, reports good strength in her lower extremities with sensation intact. She ambulates without difficulty.  History reviewed. No pertinent past medical history. Past Surgical History  Procedure Laterality Date  . Tonsillectomy     Family History  Problem Relation Age of Onset  . Healthy Mother   . Healthy Father   . Healthy Sister   . Healthy Maternal Grandmother   . Healthy Paternal Grandmother   . Healthy Paternal Grandfather   . Healthy Sister    Social History  Substance Use Topics  . Smoking status: Current Every Day Smoker    Types: Cigarettes  . Smokeless tobacco: None  . Alcohol Use: Yes   OB History    No data available     Review of Systems  All other systems reviewed and are negative.   Allergies  Review of patient's allergies indicates no known allergies.  Home Medications   Prior to Admission medications   Medication Sig Start Date End Date Taking? Authorizing Provider  cyclobenzaprine (FLEXERIL) 5 MG tablet Take 1 tablet (5 mg total) by mouth 3 (three) times daily  as needed for muscle spasms. 11/18/14   Okey Regal, PA-C  ibuprofen (ADVIL,MOTRIN) 800 MG tablet Take 1 tablet (800 mg total) by mouth every 8 (eight) hours as needed for moderate pain. 07/26/14   Merryl Hacker, MD  ondansetron (ZOFRAN) 8 MG tablet Take 1 tablet (8 mg total) by mouth every 8 (eight) hours as needed for nausea or vomiting. 12/18/13   Gregor Hams, MD  ondansetron (ZOFRAN) 8 MG tablet Take 1 tablet (8 mg total) by mouth every 8 (eight) hours as needed for nausea or vomiting. 06/16/14   Mercedes Camprubi-Soms, PA-C  oxyCODONE-acetaminophen (PERCOCET/ROXICET) 5-325 MG per tablet Take 1 tablet by mouth every 6 (six) hours as needed for severe pain. 07/26/14   Merryl Hacker, MD   BP 138/83 mmHg  Pulse 62  Temp(Src) 98.2 F (36.8 C) (Oral)  Resp 18  Ht 5\' 7"  (1.702 m)  Wt 270 lb (122.471 kg)  BMI 42.28 kg/m2  SpO2 100%   Physical Exam  Constitutional: She is oriented to person, place, and time. She appears well-developed and well-nourished.  HENT:  Head: Normocephalic and atraumatic.  Eyes: Conjunctivae are normal. Pupils are equal, round, and reactive to light. Right eye exhibits no discharge. Left eye exhibits no discharge. No scleral icterus.  Neck: Normal range of motion. No JVD present. No tracheal deviation present.  Cardiovascular: Normal rate.   Pulmonary/Chest: Effort normal. No stridor.  Abdominal: Soft. She exhibits no distension and no  mass. There is no tenderness. There is no rebound and no guarding.  Musculoskeletal: Normal range of motion. She exhibits no edema or tenderness.  Minor tenderness to the paravertebral muscles of the thoracic and lumbar spine, no CT or L-spine tenderness. No obvious deformities or signs of trauma. No signs of infection. Sensation to the lower extremities grossly intact, strength 5 out of 5, Refill less than 3 seconds.  Neurological: She is alert and oriented to person, place, and time. Coordination normal.  Skin: Skin is warm and  dry.  Psychiatric: She has a normal mood and affect. Her behavior is normal. Judgment and thought content normal.  Nursing note and vitals reviewed.   ED Course  Procedures (including critical care time) Labs Review Labs Reviewed  URINALYSIS, ROUTINE W REFLEX MICROSCOPIC (NOT AT [facility]) - Abnormal; Notable for the following:    APPearance CLOUDY (*)    Ketones, ur 40 (*)    Leukocytes, UA SMALL (*)    All other components within normal limits  URINE MICROSCOPIC-ADD ON - Abnormal; Notable for the following:    Squamous Epithelial / LPF FEW (*)    Bacteria, UA FEW (*)    All other components within normal limits  PREGNANCY, URINE    Imaging Review No results found. I have personally reviewed and evaluated these images and lab results as part of my medical decision-making.   EKG Interpretation None      MDM   Final diagnoses:  Bilateral thoracic back pain    Labs: Urinalysis, urine pregnant- no significant findings  Imaging:  Consults:  Therapeutics: Ibuprofen  Discharge Meds: Flexeril  Assessment/Plan: Patient presents with back pain, this is recurrent for her, similar to previous. She has no red flags, abdomen and nontender, urine normal, no indication that this is intra-abdominal or renal. No signs of cauda equina syndrome. Patient has no distal extremity findings, ambulatory without difficulty. She was treated with ibuprofen here in the ED, discharged home with Flexeril, encouraged to do back exercises, follow-up with primary care provider for further evaluation and management. Patient reports that her 90 day. If new employment is coming up and she will be able to qualify for insurance in one week. She reports she will her primary care at that time.         Okey Regal, PA-C 11/18/14 1656  Sherwood Gambler, MD 11/22/14 [phone]

## 2014-11-18 NOTE — Discharge Instructions (Signed)
Back Exercises Back exercises help treat and prevent back injuries. The goal of back exercises is to increase the strength of your abdominal and back muscles and the flexibility of your back. These exercises should be started when you no longer have back pain. Back exercises include:  Pelvic Tilt. Lie on your back with your knees bent. Tilt your pelvis until the lower part of your back is against the floor. Hold this position 5 to 10 sec and repeat 5 to 10 times.  Knee to Chest. Pull first 1 knee up against your chest and hold for 20 to 30 seconds, repeat this with the other knee, and then both knees. This may be done with the other leg straight or bent, whichever feels better.  Sit-Ups or Curl-Ups. Bend your knees 90 degrees. Start with tilting your pelvis, and do a partial, slow sit-up, lifting your trunk only 30 to 45 degrees off the floor. Take at least 2 to 3 seconds for each sit-up. Do not do sit-ups with your knees out straight. If partial sit-ups are difficult, simply do the above but with only tightening your abdominal muscles and holding it as directed.  Hip-Lift. Lie on your back with your knees flexed 90 degrees. Push down with your feet and shoulders as you raise your hips a couple inches off the floor; hold for 10 seconds, repeat 5 to 10 times.  Back arches. Lie on your stomach, propping yourself up on bent elbows. Slowly press on your hands, causing an arch in your low back. Repeat 3 to 5 times. Any initial stiffness and discomfort should lessen with repetition over time.  Shoulder-Lifts. Lie face down with arms beside your body. Keep hips and torso pressed to floor as you slowly lift your head and shoulders off the floor. Do not overdo your exercises, especially in the beginning. Exercises may cause you some mild back discomfort which lasts for a few minutes; however, if the pain is more severe, or lasts for more than 15 minutes, do not continue exercises until you see your caregiver.  Improvement with exercise therapy for back problems is slow.  See your caregivers for assistance with developing a proper back exercise program. Document Released: 04/15/2004 Document Revised: 05/31/2011 Document Reviewed: 01/07/2011 [facility] Patient Information 2015 [place], [place]. This information is not intended to replace advice given to you by your health care provider. Make sure you discuss any questions you have with your health care provider.  Please monitor for new or worsening signs or symptoms follow-up with your primary care provider further evaluation and management.

## 2014-11-18 NOTE — ED Notes (Signed)
Lower back pain x 3 weeks. No injury.

## 2014-11-18 NOTE — ED Notes (Addendum)
Directed to pharmacy to pick up medication- resource guide reviewed

## 2015-04-20 ENCOUNTER — Encounter (HOSPITAL_BASED_OUTPATIENT_CLINIC_OR_DEPARTMENT_OTHER): Payer: Self-pay | Admitting: Emergency Medicine

## 2015-04-20 ENCOUNTER — Emergency Department (HOSPITAL_BASED_OUTPATIENT_CLINIC_OR_DEPARTMENT_OTHER)
Admission: EM | Admit: 2015-04-20 | Discharge: 2015-04-20 | Disposition: A | Discharge status: Home / Self Care | Payer: Self-pay | Attending: Emergency Medicine | Admitting: Emergency Medicine

## 2015-04-20 DIAGNOSIS — F17210 Nicotine dependence, cigarettes, uncomplicated: Secondary | ICD-10-CM | POA: Insufficient documentation

## 2015-04-20 DIAGNOSIS — Z3202 Encounter for pregnancy test, result negative: Secondary | ICD-10-CM | POA: Insufficient documentation

## 2015-04-20 DIAGNOSIS — N390 Urinary tract infection, site not specified: Secondary | ICD-10-CM | POA: Insufficient documentation

## 2015-04-20 LAB — URINALYSIS, ROUTINE W REFLEX MICROSCOPIC
Bilirubin Urine: NEGATIVE
GLUCOSE, UA: NEGATIVE mg/dL
KETONES UR: NEGATIVE mg/dL
LEUKOCYTES UA: NEGATIVE
Nitrite: NEGATIVE
Protein, ur: NEGATIVE mg/dL
Specific Gravity, Urine: 1.021 (ref 1.005–1.030)
pH: 6 (ref 5.0–8.0)

## 2015-04-20 LAB — URINE MICROSCOPIC-ADD ON

## 2015-04-20 LAB — PREGNANCY, URINE: Preg Test, Ur: NEGATIVE

## 2015-04-20 MED ORDER — NITROFURANTOIN MONOHYD MACRO 100 MG PO CAPS: 100.0000 mg | ORAL_CAPSULE | Freq: Two times a day (BID) | ORAL | Status: DC

## 2015-04-20 NOTE — ED Notes (Signed)
Pt states urinary frquency for past 1-2 weeks.  Denies any back pain, hematuria or other symptoms.

## 2015-04-20 NOTE — Discharge Instructions (Signed)
Take your antibiotics as prescribed. Follow-up with your doctor as needed. Return to ED for new or worsening symptoms.  Urinary Tract Infection Urinary tract infections (UTIs) can develop anywhere along your urinary tract. Your urinary tract is your body's drainage system for removing wastes and extra water. Your urinary tract includes two kidneys, two ureters, a bladder, and a urethra. Your kidneys are a pair of bean-shaped organs. Each kidney is about the size of your fist. They are located below your ribs, one on each side of your spine. CAUSES Infections are caused by microbes, which are microscopic organisms, including fungi, viruses, and bacteria. These organisms are so small that they can only be seen through a microscope. Bacteria are the microbes that most commonly cause UTIs. SYMPTOMS  Symptoms of UTIs may vary by age and gender of the patient and by the location of the infection. Symptoms in young women typically include a frequent and intense urge to urinate and a painful, burning feeling in the bladder or urethra during urination. Older women and men are more likely to be tired, shaky, and weak and have muscle aches and abdominal pain. A fever may mean the infection is in your kidneys. Other symptoms of a kidney infection include pain in your back or sides below the ribs, nausea, and vomiting. DIAGNOSIS To diagnose a UTI, your caregiver will ask you about your symptoms. Your caregiver will also ask you to provide a urine sample. The urine sample will be tested for bacteria and white blood cells. White blood cells are made by your body to help fight infection. TREATMENT  Typically, UTIs can be treated with medication. Because most UTIs are caused by a bacterial infection, they usually can be treated with the use of antibiotics. The choice of antibiotic and length of treatment depend on your symptoms and the type of bacteria causing your infection. HOME CARE INSTRUCTIONS  If you were  prescribed antibiotics, take them exactly as your caregiver instructs you. Finish the medication even if you feel better after you have only taken some of the medication.  Drink enough water and fluids to keep your urine clear or pale yellow.  Avoid caffeine, tea, and carbonated beverages. They tend to irritate your bladder.  Empty your bladder often. Avoid holding urine for long periods of time.  Empty your bladder before and after sexual intercourse.  After a bowel movement, women should cleanse from front to back. Use each tissue only once. SEEK MEDICAL CARE IF:   You have back pain.  You develop a fever.  Your symptoms do not begin to resolve within 3 days. SEEK IMMEDIATE MEDICAL CARE IF:   You have severe back pain or lower abdominal pain.  You develop chills.  You have nausea or vomiting.  You have continued burning or discomfort with urination. MAKE SURE YOU:   Understand these instructions.  Will watch your condition.  Will get help right away if you are not doing well or get worse.   This information is not intended to replace advice given to you by your health care provider. Make sure you discuss any questions you have with your health care provider.   Document Released: 12/16/2004 Document Revised: 11/27/2014 Document Reviewed: 04/16/2011 Elsevier Interactive Patient Education [employer].

## 2015-04-20 NOTE — ED Provider Notes (Signed)
CSN: [id number]     Arrival date & time 04/20/15  2004 History   First MD Initiated Contact with Patient 04/20/15 2102     Chief Complaint  Patient presents with  . Urinary Frequency     (Consider location/radiation/quality/duration/timing/severity/associated sxs/prior Treatment) HPI [name] is a 25 y.o. female who comes in for evaluation of urinary frequency. Patient reports she feels like she may have a UTI. She reports similar symptoms when she has had a UTI in the past. She reports pressure when she urinates, frequency, mild dysuria. She denies any fevers, chills, nausea or vomiting, over abdominal pain, back pain, flank pain. No pelvic pain, vaginal bleeding or discharge. Has not tried anything to improve her symptoms. Nothing seems to make the problem better or worse. No other modifying factors.  History reviewed. No pertinent past medical history. Past Surgical History  Procedure Laterality Date  . Tonsillectomy     Family History  Problem Relation Age of Onset  . Healthy Mother   . Healthy Father   . Healthy Sister   . Healthy Maternal Grandmother   . Healthy Paternal Grandmother   . Healthy Paternal Grandfather   . Healthy Sister    Social History  Substance Use Topics  . Smoking status: Current Every Day Smoker    Types: Cigarettes  . Smokeless tobacco: None  . Alcohol Use: Yes   OB History    No data available     Review of Systems A 10 point review of systems was completed and was negative except for pertinent positives and negatives as mentioned in the history of present illness     Allergies  Review of patient's allergies indicates no known allergies.  Home Medications   Prior to Admission medications   Medication Sig Start Date End Date Taking? Authorizing Provider  cyclobenzaprine (FLEXERIL) 5 MG tablet Take 1 tablet (5 mg total) by mouth 3 (three) times daily as needed for muscle spasms. 11/18/14   Okey Regal, PA-C  ibuprofen  (ADVIL,MOTRIN) 800 MG tablet Take 1 tablet (800 mg total) by mouth every 8 (eight) hours as needed for moderate pain. 07/26/14   Merryl Hacker, MD  nitrofurantoin, macrocrystal-monohydrate, (MACROBID) 100 MG capsule Take 1 capsule (100 mg total) by mouth 2 (two) times daily. 04/20/15   Comer Locket, PA-C  ondansetron (ZOFRAN) 8 MG tablet Take 1 tablet (8 mg total) by mouth every 8 (eight) hours as needed for nausea or vomiting. 12/18/13   Gregor Hams, MD  ondansetron (ZOFRAN) 8 MG tablet Take 1 tablet (8 mg total) by mouth every 8 (eight) hours as needed for nausea or vomiting. 06/16/14   Mercedes Camprubi-Soms, PA-C  oxyCODONE-acetaminophen (PERCOCET/ROXICET) 5-325 MG per tablet Take 1 tablet by mouth every 6 (six) hours as needed for severe pain. 07/26/14   Merryl Hacker, MD   BP 106/46 mmHg  Pulse 63  Temp(Src) 98.2 F (36.8 C) (Oral)  Resp 18  Ht 5\' 7"  (1.702 m)  Wt 122.471 kg  BMI 42.28 kg/m2  SpO2 100%  LMP 02/22/2015 Physical Exam  Constitutional: She is oriented to person, place, and time. She appears well-developed and well-nourished.  HENT:  Head: Normocephalic and atraumatic.  Mouth/Throat: Oropharynx is clear and moist.  Eyes: Conjunctivae are normal. Pupils are equal, round, and reactive to light. Right eye exhibits no discharge. Left eye exhibits no discharge. No scleral icterus.  Neck: Neck supple.  Cardiovascular: Normal rate, regular rhythm and normal heart sounds.   Pulmonary/Chest: Effort  normal and breath sounds normal. No respiratory distress. She has no wheezes. She has no rales.  Abdominal: Soft. There is no tenderness.  Musculoskeletal: She exhibits no tenderness.  Neurological: She is alert and oriented to person, place, and time.  Cranial Nerves II-XII grossly intact  Skin: Skin is warm and dry. No rash noted.  Psychiatric: She has a normal mood and affect.  Nursing note and vitals reviewed.   ED Course  Procedures (including critical care  time) Labs Review Labs Reviewed  URINALYSIS, ROUTINE W REFLEX MICROSCOPIC (NOT AT [facility]) - Abnormal; Notable for the following:    Hgb urine dipstick TRACE (*)    All other components within normal limits  URINE MICROSCOPIC-ADD ON - Abnormal; Notable for the following:    Squamous Epithelial / LPF 0-5 (*)    Bacteria, UA MANY (*)    All other components within normal limits  PREGNANCY, URINE    Imaging Review No results found. I have personally reviewed and evaluated these images and lab results as part of my medical decision-making.   EKG Interpretation None     Meds given in ED:  Medications - No data to display  Discharge Medication List as of 04/20/2015  9:29 PM    START taking these medications   Details  nitrofurantoin, macrocrystal-monohydrate, (MACROBID) 100 MG capsule Take 1 capsule (100 mg total) by mouth 2 (two) times daily., Starting 04/20/2015, Until Discontinued, Print       Filed Vitals:   04/20/15 2011 04/20/15 2136  BP: 147/77 106/46  Pulse: 58 63  Temp: 98.2 F (36.8 C)   TempSrc: Oral   Resp: 18 18  Height: 5\' 7"  (1.702 m)   Weight: 122.471 kg   SpO2: 100% 100%    MDM  Pt has been diagnosed with a UTI. Pregnancy is negative. Hemoglobin and bacteria in urine. Symptoms similar to previously diagnosed UTI. Pt is afebrile, no CVA tenderness, normotensive, and denies N/V. Pt to be dc home with antibiotics and instructions to follow up with PCP if symptoms persist.  Final diagnoses:  UTI (lower urinary tract infection)      Comer Locket, PA-C 04/20/15 [place], MD 04/23/15 [phone]

## 2015-06-02 ENCOUNTER — Emergency Department (HOSPITAL_BASED_OUTPATIENT_CLINIC_OR_DEPARTMENT_OTHER)
Admission: EM | Admit: 2015-06-02 | Discharge: 2015-06-02 | Disposition: A | Discharge status: Home / Self Care | Payer: Self-pay | Attending: Emergency Medicine | Admitting: Emergency Medicine

## 2015-06-02 ENCOUNTER — Encounter (HOSPITAL_BASED_OUTPATIENT_CLINIC_OR_DEPARTMENT_OTHER): Payer: Self-pay | Admitting: Emergency Medicine

## 2015-06-02 DIAGNOSIS — M549 Dorsalgia, unspecified: Secondary | ICD-10-CM

## 2015-06-02 DIAGNOSIS — F17210 Nicotine dependence, cigarettes, uncomplicated: Secondary | ICD-10-CM | POA: Insufficient documentation

## 2015-06-02 DIAGNOSIS — M545 Low back pain: Secondary | ICD-10-CM | POA: Insufficient documentation

## 2015-06-02 DIAGNOSIS — Z792 Long term (current) use of antibiotics: Secondary | ICD-10-CM | POA: Insufficient documentation

## 2015-06-02 MED ORDER — NAPROXEN 500 MG PO TABS: 500.0000 mg | ORAL_TABLET | Freq: Two times a day (BID) | ORAL | Status: DC

## 2015-06-02 MED ORDER — METHOCARBAMOL 500 MG PO TABS: 500.0000 mg | ORAL_TABLET | Freq: Four times a day (QID) | ORAL | Status: DC | PRN

## 2015-06-02 MED FILL — NAPROXEN 500 MG TABLET: 500 | 15 days supply | Qty: 30 | Fill #0

## 2015-06-02 MED FILL — METHOCARBAMOL 500 MG TABLET: 500 | 4 days supply | Qty: 15 | Fill #0

## 2015-06-02 NOTE — ED Notes (Signed)
Onset 3 days ago with back spasms. Spasms improve with stretching and bending. Seen here a month ago for the same, but muscle relaxers are no longer helping.   Denies urinary symptoms or additional complaints.

## 2015-06-02 NOTE — ED Provider Notes (Signed)
CSN: [id number]     Arrival date & time 06/02/15  1021 History   First MD Initiated Contact with Patient 06/02/15 1200     Chief Complaint  Patient presents with  . Back Pain     (Consider location/radiation/quality/duration/timing/severity/associated sxs/prior Treatment) Patient is a 25 y.o. female presenting with back pain.  Back Pain Location:  Generalized Quality: spasms. Radiates to:  Does not radiate Onset quality:  Gradual Duration:  2 days Timing:  Constant Progression:  Worsening Chronicity:  Recurrent Context: not recent injury   Relieved by:  None tried Worsened by:  Bending and movement Associated symptoms: no abdominal pain, no bladder incontinence, no bowel incontinence, no chest pain, no dysuria, no fever, no leg pain, no numbness, no paresthesias, no tingling and no weakness   Risk factors: obesity   Risk factors: no hx of cancer and no recent surgery   Risk factors comment:  No history of IVDU   History reviewed. No pertinent past medical history. Past Surgical History  Procedure Laterality Date  . Tonsillectomy     Family History  Problem Relation Age of Onset  . Healthy Mother   . Healthy Father   . Healthy Sister   . Healthy Maternal Grandmother   . Healthy Paternal Grandmother   . Healthy Paternal Grandfather   . Healthy Sister    Social History  Substance Use Topics  . Smoking status: Current Every Day Smoker    Types: Cigarettes  . Smokeless tobacco: None  . Alcohol Use: Yes   OB History    No data available     Review of Systems  Constitutional: Negative for fever.  Cardiovascular: Negative for chest pain.  Gastrointestinal: Negative for abdominal pain and bowel incontinence.  Genitourinary: Negative for bladder incontinence and dysuria.  Musculoskeletal: Positive for back pain.  Neurological: Negative for tingling, weakness, numbness and paresthesias.  All other systems reviewed and are negative.     Allergies  Review of  patient's allergies indicates no known allergies.  Home Medications   Prior to Admission medications   Medication Sig Start Date End Date Taking? Authorizing Provider  cyclobenzaprine (FLEXERIL) 5 MG tablet Take 1 tablet (5 mg total) by mouth 3 (three) times daily as needed for muscle spasms. 11/18/14   Okey Regal, PA-C  ibuprofen (ADVIL,MOTRIN) 800 MG tablet Take 1 tablet (800 mg total) by mouth every 8 (eight) hours as needed for moderate pain. 07/26/14   Merryl Hacker, MD  nitrofurantoin, macrocrystal-monohydrate, (MACROBID) 100 MG capsule Take 1 capsule (100 mg total) by mouth 2 (two) times daily. 04/20/15   Comer Locket, PA-C  ondansetron (ZOFRAN) 8 MG tablet Take 1 tablet (8 mg total) by mouth every 8 (eight) hours as needed for nausea or vomiting. 12/18/13   Gregor Hams, MD  ondansetron (ZOFRAN) 8 MG tablet Take 1 tablet (8 mg total) by mouth every 8 (eight) hours as needed for nausea or vomiting. 06/16/14   Mercedes Camprubi-Soms, PA-C  oxyCODONE-acetaminophen (PERCOCET/ROXICET) 5-325 MG per tablet Take 1 tablet by mouth every 6 (six) hours as needed for severe pain. 07/26/14   Merryl Hacker, MD   BP 145/92 mmHg  Pulse 57  Temp(Src) 98.1 F (36.7 C) (Oral)  Resp 18  Ht 5\' 7"  (1.702 m)  Wt 122.471 kg  BMI 42.28 kg/m2  SpO2 100%  LMP 04/26/2015 Physical Exam  Constitutional: She appears well-developed and well-nourished.  HENT:  Head: Normocephalic and atraumatic.  Neck: Normal range of motion. Neck supple.  Cardiovascular: Normal rate, regular rhythm and normal heart sounds.   Pulmonary/Chest: Effort normal and breath sounds normal.  Musculoskeletal: Normal range of motion.       Cervical back: She exhibits normal range of motion, no tenderness, no bony tenderness, no swelling, no edema and no deformity.       Thoracic back: She exhibits normal range of motion, no tenderness, no bony tenderness, no swelling, no edema and no deformity.       Lumbar back: She exhibits  normal range of motion, no tenderness, no bony tenderness, no swelling, no edema and no deformity.  Neurological: She is alert. She has normal strength. No sensory deficit. Gait normal.  Reflex Scores:      Patellar reflexes are 2+ on the right side and 2+ on the left side. Skin: Skin is warm and dry.  Psychiatric: She has a normal mood and affect.  Nursing note and vitals reviewed.   ED Course  Procedures (including critical care time) Labs Review Labs Reviewed - No data to display  Imaging Review No results found. I have personally reviewed and evaluated these images and lab results as part of my medical decision-making.   EKG Interpretation None      MDM   Final diagnoses:  None   Patient with back pain.  No neurological deficits and normal neuro exam.  Patient can walk but states is painful.  No loss of bowel or bladder control.  No concern for cauda equina.  No fever, night sweats, weight loss, h/o cancer, IVDU.  Patient stable for discharge.  Return precautions given.     Hyman Bible, PA-C 06/02/15 [id number]  Veryl Speak, MD 06/03/15 [phone]

## 2015-11-07 ENCOUNTER — Emergency Department (HOSPITAL_BASED_OUTPATIENT_CLINIC_OR_DEPARTMENT_OTHER)
Admission: EM | Admit: 2015-11-07 | Discharge: 2015-11-07 | Disposition: A | Discharge status: Home / Self Care | Payer: Self-pay | Attending: Emergency Medicine | Admitting: Emergency Medicine

## 2015-11-07 ENCOUNTER — Encounter (HOSPITAL_BASED_OUTPATIENT_CLINIC_OR_DEPARTMENT_OTHER): Payer: Self-pay | Admitting: Emergency Medicine

## 2015-11-07 DIAGNOSIS — F17210 Nicotine dependence, cigarettes, uncomplicated: Secondary | ICD-10-CM | POA: Insufficient documentation

## 2015-11-07 DIAGNOSIS — Z3200 Encounter for pregnancy test, result unknown: Secondary | ICD-10-CM

## 2015-11-07 DIAGNOSIS — Z3202 Encounter for pregnancy test, result negative: Secondary | ICD-10-CM | POA: Insufficient documentation

## 2015-11-07 LAB — PREGNANCY, URINE: PREG TEST UR: NEGATIVE

## 2015-11-07 NOTE — ED Provider Notes (Signed)
[facility] Provider Note   CSN: [id number] Arrival date & time: 11/07/15  [id number]     History   Chief Complaint Chief Complaint  Patient presents with  . Possible Pregnancy    HPI Annette Greene is a 25 y.o. female.  25 year old female who presents for a pregnancy test. The patient thought that she may be pregnant and took several pregnancy tests at home. She kept getting negative tests but then error messages so she presented here for another test. She endorses mild spotting a few days ago but no current vaginal bleeding or discharge. No abdominal pain, fevers, or recent illness. No complaints currently.   The history is provided by the patient.  Possible Pregnancy     History reviewed. No pertinent past medical history.  There are no active problems to display for this patient.   Past Surgical History:  Procedure Laterality Date  . TONSILLECTOMY      OB History    No data available       Home Medications    Prior to Admission medications   Medication Sig Start Date End Date Taking? Authorizing Provider  cyclobenzaprine (FLEXERIL) 5 MG tablet Take 1 tablet (5 mg total) by mouth 3 (three) times daily as needed for muscle spasms. 11/18/14   Okey Regal, PA-C  ibuprofen (ADVIL,MOTRIN) 800 MG tablet Take 1 tablet (800 mg total) by mouth every 8 (eight) hours as needed for moderate pain. 07/26/14   Merryl Hacker, MD  methocarbamol (ROBAXIN) 500 MG tablet Take 1 tablet (500 mg total) by mouth every 6 (six) hours as needed for muscle spasms. 06/02/15   Heather Laisure, PA-C  naproxen (NAPROSYN) 500 MG tablet Take 1 tablet (500 mg total) by mouth 2 (two) times daily. 06/02/15   Heather Laisure, PA-C  nitrofurantoin, macrocrystal-monohydrate, (MACROBID) 100 MG capsule Take 1 capsule (100 mg total) by mouth 2 (two) times daily. 04/20/15   Comer Locket, PA-C  ondansetron (ZOFRAN) 8 MG tablet Take 1 tablet (8 mg total) by mouth every 8 (eight) hours as needed  for nausea or vomiting. 12/18/13   Gregor Hams, MD  ondansetron (ZOFRAN) 8 MG tablet Take 1 tablet (8 mg total) by mouth every 8 (eight) hours as needed for nausea or vomiting. 06/16/14   Mercedes Camprubi-Soms, PA-C  oxyCODONE-acetaminophen (PERCOCET/ROXICET) 5-325 MG per tablet Take 1 tablet by mouth every 6 (six) hours as needed for severe pain. 07/26/14   Merryl Hacker, MD    Family History Family History  Problem Relation Age of Onset  . Healthy Mother   . Healthy Father   . Healthy Sister   . Healthy Maternal Grandmother   . Healthy Paternal Grandmother   . Healthy Paternal Grandfather   . Healthy Sister     Social History Social History  Substance Use Topics  . Smoking status: Current Every Day Smoker    Types: Cigarettes  . Smokeless tobacco: Never Used  . Alcohol use Yes     Allergies   Review of patient's allergies indicates no known allergies.   Review of Systems Review of Systems 10 Systems reviewed and are negative for acute change except as noted in the HPI.   Physical Exam Updated Vital Signs BP 128/72 (BP Location: Left Arm)   Pulse 61   Temp 98 F (36.7 C) (Oral)   Resp 16   LMP 09/14/2015 (Approximate)   SpO2 100%   Physical Exam  Constitutional: She is oriented to person, place, and time.  She appears well-developed and well-nourished. No distress.  HENT:  Head: Normocephalic and atraumatic.  Eyes: Conjunctivae are normal.  Neck: Neck supple.  Neurological: She is alert and oriented to person, place, and time.  Skin: Skin is warm and dry.  Psychiatric: She has a normal mood and affect. Judgment normal.  Nursing note and vitals reviewed.    ED Treatments / Results  Labs (all labs ordered are listed, but only abnormal results are displayed) Labs Reviewed  PREGNANCY, URINE    EKG  EKG Interpretation None       Radiology No results found.  Procedures Procedures (including critical care time)  Medications Ordered in  ED Medications - No data to display   Initial Impression / Assessment and Plan / ED Course  I have reviewed the triage vital signs and the nursing notes.  Pertinent labs that were available during my care of the patient were reviewed by me and considered in my medical decision making (see chart for details).  Clinical Course   Pt here requesting pregnancy test, no complaints. UPT negative here. I informed patient that ED is for potential life-threatening emergencies and routine care is more appropriate for health department and/or [facility] hospital outpatient clinic. Instructed to repeat home pregnancy test in 1 week if she is late on her period or has ongoing concerns of early pregnancy.  Final Clinical Impressions(s) / ED Diagnoses   Final diagnoses:  Encounter for pregnancy test    New Prescriptions New Prescriptions   No medications on file     Sharlett Iles, MD 11/07/15 [phone]

## 2015-11-07 NOTE — ED Triage Notes (Signed)
Pt states she wants a pregnancy test. Pt has taken 3 home tests and will initially be negative then be a faint positive or gets an error. No complaints.

## 2016-01-29 ENCOUNTER — Encounter (HOSPITAL_BASED_OUTPATIENT_CLINIC_OR_DEPARTMENT_OTHER): Payer: Self-pay

## 2016-01-29 ENCOUNTER — Emergency Department (HOSPITAL_BASED_OUTPATIENT_CLINIC_OR_DEPARTMENT_OTHER)
Admission: EM | Admit: 2016-01-29 | Discharge: 2016-01-29 | Disposition: A | Discharge status: Home / Self Care | Payer: Managed Care, Other (non HMO) | Attending: Emergency Medicine | Admitting: Emergency Medicine

## 2016-01-29 DIAGNOSIS — M545 Low back pain: Secondary | ICD-10-CM | POA: Insufficient documentation

## 2016-01-29 DIAGNOSIS — M546 Pain in thoracic spine: Secondary | ICD-10-CM | POA: Insufficient documentation

## 2016-01-29 DIAGNOSIS — F17210 Nicotine dependence, cigarettes, uncomplicated: Secondary | ICD-10-CM | POA: Insufficient documentation

## 2016-01-29 DIAGNOSIS — M5489 Other dorsalgia: Secondary | ICD-10-CM

## 2016-01-29 MED ORDER — NAPROXEN 500 MG PO TABS: 500.0000 mg | ORAL_TABLET | Freq: Two times a day (BID) | ORAL | 0 refills | Status: DC

## 2016-01-29 MED ORDER — IBUPROFEN 800 MG PO TABS
ORAL_TABLET | ORAL | Status: AC
  Filled 2016-01-29: qty 1

## 2016-01-29 MED ORDER — IBUPROFEN 800 MG PO TABS
800.0000 mg | ORAL_TABLET | Freq: Once | ORAL | Status: AC
  Administered 2016-01-29: 800 mg via ORAL

## 2016-01-29 MED ORDER — CYCLOBENZAPRINE HCL 10 MG PO TABS: 10.0000 mg | ORAL_TABLET | Freq: Three times a day (TID) | ORAL | 0 refills | Status: DC | PRN

## 2016-01-29 NOTE — Discharge Instructions (Signed)
Naproxen as prescribed.  Flexeril as prescribed as needed for pain not relieved with naproxen.  Follow-up with a primary Dr. if your symptoms are not improving in the next week, and return to the ER if symptoms significantly worsen or change.

## 2016-01-29 NOTE — ED Triage Notes (Signed)
C/o back spasms x 1 month-denies injury-NAD-steady gait

## 2016-01-29 NOTE — ED Provider Notes (Signed)
[facility] Provider Note   CSN: [id number] Arrival date & time: 01/29/16  1904  By signing my name below, I, Julien Nordmann, attest that this documentation has been prepared under the direction and in the presence of Veryl Speak, MD.  Electronically Signed: Julien Nordmann, ED Scribe. 01/29/16. 7:47 PM.    History   Chief Complaint Chief Complaint  Patient presents with  . Back Pain    The history is provided by the patient. No language interpreter was used.  Back Pain   This is a recurrent problem. The current episode started more than 1 week ago. The problem has been gradually worsening. The pain is associated with lifting heavy objects. The pain is present in the thoracic spine and lumbar spine. The quality of the pain is described as cramping. The pain does not radiate. The pain is moderate. The symptoms are aggravated by bending. The pain is the same all the time. Pertinent negatives include no bowel incontinence, no bladder incontinence, no dysuria and no leg pain. She has tried nothing for the symptoms. The treatment provided no relief.   HPI Comments: Annette Greene is a 25 y.o. female who presents to the Emergency Department complaining of recurrent, gradual worsening, moderate back spasms x 1 month. She reports that the pain radiates from her upper back into her lower back. The pain does not radiate into her legs. Pt describes the pain as a cramping sensation and notes that it "locks up". Pt does moderate lifting and bending at her new job but she notes that her spasms were occurring prior to starting. She is able to ambulate without difficulty. Pt has not taken any pain medication to alleviate her pain. Denies bladder/bowel incontinence, dysuria, or hematuria.  History reviewed. No pertinent past medical history.  There are no active problems to display for this patient.   Past Surgical History:  Procedure Laterality Date  . TONSILLECTOMY      OB History    No data available       Home Medications    Prior to Admission medications   Not on File    Family History Family History  Problem Relation Age of Onset  . Healthy Mother   . Healthy Father   . Healthy Sister   . Healthy Maternal Grandmother   . Healthy Paternal Grandmother   . Healthy Paternal Grandfather   . Healthy Sister     Social History Social History  Substance Use Topics  . Smoking status: Current Every Day Smoker    Types: Cigarettes  . Smokeless tobacco: Never Used  . Alcohol use Yes     Comment: occ     Allergies   Patient has no known allergies.   Review of Systems Review of Systems  Gastrointestinal: Negative for bowel incontinence.  Genitourinary: Negative for bladder incontinence and dysuria.  Musculoskeletal: Positive for back pain.     Physical Exam Updated Vital Signs BP 131/76 (BP Location: Right Arm)   Pulse 65   Temp 98.2 F (36.8 C) (Oral)   Resp 18   Ht 5\' 7"  (1.702 m)   Wt 244 lb 2 oz (110.7 kg)   LMP 01/28/2016   SpO2 100%   BMI 38.24 kg/m   Physical Exam  Constitutional: She is oriented to person, place, and time. She appears well-developed and well-nourished.  HENT:  Head: Normocephalic.  Eyes: EOM are normal.  Neck: Normal range of motion.  Pulmonary/Chest: Effort normal.  Abdominal: She exhibits no distension.  Musculoskeletal: Normal range of motion.  TTP in the soft tissues of the lower thoracic/upper lumbar region.   Neurological: She is alert and oriented to person, place, and time.  DTRs are trace and symmetrical in the achilles and patellar tendons bilaterally. Strength is 5/5 in both lower extremities. Able to walk on heels and toes without difficulty.  Psychiatric: She has a normal mood and affect.  Nursing note and vitals reviewed.    ED Treatments / Results  DIAGNOSTIC STUDIES: Oxygen Saturation is 100% on RA, normal by my interpretation.  COORDINATION OF CARE:  7:43 PM Discussed treatment plan  with pt at bedside and pt agreed to plan.  Labs (all labs ordered are listed, but only abnormal results are displayed) Labs Reviewed - No data to display  EKG  EKG Interpretation None       Radiology No results found.  Procedures Procedures (including critical care time)  Medications Ordered in ED Medications - No data to display   Initial Impression / Assessment and Plan / ED Course  I have reviewed the triage vital signs and the nursing notes.  Pertinent labs & imaging results that were available during my care of the patient were reviewed by me and considered in my medical decision making (see chart for details).  Clinical Course    Patient presents with back pain. There are no red flags that would suggest an emergent situation. She has no bowel or bladder issues, reflexes are symmetrical, and she is able walk on heels and toes without difficulty. She will be discharged with NSAIDs, Flexeril, and when necessary return.  I personally performed the services described in this documentation, which was scribed in my presence. The recorded information has been reviewed and is accurate.     Final Clinical Impressions(s) / ED Diagnoses   Final diagnoses:  None    New Prescriptions New Prescriptions   No medications on file     Veryl Speak, MD 01/29/16 2236

## 2016-02-01 ENCOUNTER — Ambulatory Visit (INDEPENDENT_AMBULATORY_CARE_PROVIDER_SITE_OTHER)
Admission: EM | Admit: 2016-02-01 | Discharge: 2016-02-01 | Disposition: A | Discharge status: Home / Self Care | Payer: 59 | Source: Home / Self Care | Attending: Family Medicine | Admitting: Family Medicine

## 2016-02-01 ENCOUNTER — Encounter (HOSPITAL_COMMUNITY): Payer: Self-pay | Admitting: Emergency Medicine

## 2016-02-01 ENCOUNTER — Emergency Department (HOSPITAL_COMMUNITY): Payer: 59

## 2016-02-01 ENCOUNTER — Observation Stay (HOSPITAL_COMMUNITY)
Admission: EM | Admit: 2016-02-01 | Discharge: 2016-02-01 | Disposition: A | Discharge status: Home / Self Care | Payer: 59 | Attending: Emergency Medicine | Admitting: Emergency Medicine

## 2016-02-01 DIAGNOSIS — F17210 Nicotine dependence, cigarettes, uncomplicated: Secondary | ICD-10-CM | POA: Insufficient documentation

## 2016-02-01 DIAGNOSIS — Z79899 Other long term (current) drug therapy: Secondary | ICD-10-CM | POA: Diagnosis not present

## 2016-02-01 DIAGNOSIS — M25512 Pain in left shoulder: Secondary | ICD-10-CM | POA: Insufficient documentation

## 2016-02-01 DIAGNOSIS — Y92415 Exit ramp or entrance ramp of street or highway as the place of occurrence of the external cause: Secondary | ICD-10-CM | POA: Insufficient documentation

## 2016-02-01 DIAGNOSIS — R0789 Other chest pain: Secondary | ICD-10-CM | POA: Diagnosis not present

## 2016-02-01 DIAGNOSIS — Y9389 Activity, other specified: Secondary | ICD-10-CM | POA: Diagnosis not present

## 2016-02-01 DIAGNOSIS — S0502XA Injury of conjunctiva and corneal abrasion without foreign body, left eye, initial encounter: Secondary | ICD-10-CM | POA: Insufficient documentation

## 2016-02-01 DIAGNOSIS — R079 Chest pain, unspecified: Secondary | ICD-10-CM | POA: Diagnosis present

## 2016-02-01 DIAGNOSIS — R0600 Dyspnea, unspecified: Secondary | ICD-10-CM | POA: Diagnosis present

## 2016-02-01 DIAGNOSIS — R55 Syncope and collapse: Secondary | ICD-10-CM | POA: Diagnosis not present

## 2016-02-01 MED ORDER — FLUORESCEIN SODIUM 1 MG OP STRP
1.0000 | ORAL_STRIP | Freq: Once | OPHTHALMIC | Status: AC
  Administered 2016-02-01: 1 via OPHTHALMIC
  Filled 2016-02-01: qty 1

## 2016-02-01 MED ORDER — IBUPROFEN 800 MG PO TABS
800.0000 mg | ORAL_TABLET | Freq: Once | ORAL | Status: AC
  Administered 2016-02-01: 800 mg via ORAL
  Filled 2016-02-01: qty 1

## 2016-02-01 MED ORDER — ERYTHROMYCIN 5 MG/GM OP OINT: TOPICAL_OINTMENT | OPHTHALMIC | 0 refills | Status: DC

## 2016-02-01 MED ORDER — IBUPROFEN 400 MG PO TABS: 400.0000 mg | ORAL_TABLET | Freq: Four times a day (QID) | ORAL | 0 refills | Status: DC | PRN

## 2016-02-01 MED ORDER — TETRACAINE HCL 0.5 % OP SOLN
2.0000 [drp] | Freq: Once | OPHTHALMIC | Status: AC
  Administered 2016-02-01: 2 [drp] via OPHTHALMIC
  Filled 2016-02-01: qty 2

## 2016-02-01 NOTE — ED Triage Notes (Signed)
Patient presents to [facility] with a complaint of pain under her left breast, near her rib. She states it hurts most when he breathes in deep. Patient states that she has been experiencing this pain for about a week now.
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
## 2016-02-01 NOTE — Discharge Instructions (Signed)
It was nice seeing you today. Your chest exam is benign. You likely have musculoskeletal pain from over use. Please limit heavy weight lifting at work. Use Ibuprofen as needed for pain.

## 2016-02-01 NOTE — ED Notes (Signed)
Patient transported to X-ray 

## 2016-02-01 NOTE — ED Provider Notes (Signed)
[place]    CSN: [id number] Arrival date & time: 02/01/16  1712     History   Chief Complaint Chief Complaint  Patient presents with  . Chest Pain    HPI Annette Greene is a 25 y.o. female.   The history is provided by the patient. No language interpreter was used.  Chest Pain  Pain location:  L chest (pain right underneath her left breast) Pain quality: sharp   Pain radiates to:  Does not radiate Pain severity:  Moderate (10/10 in severity when she has it. Now she is pain free) Onset quality:  Sudden Duration:  1 week Timing:  Intermittent Progression:  Waxing and waning Chronicity:  New Context: not breathing   Context comment:  Feels like a bubble in the area which improves with deep breath  Relieved by: Deep breath. 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  History reviewed. No pertinent past medical history.  There are no active problems to display for this patient.   Past Surgical History:  Procedure Laterality Date  . TONSILLECTOMY      OB History    No data available       Home Medications    Prior to Admission medications   Medication Sig Start Date End Date Taking? Authorizing Provider  cyclobenzaprine (FLEXERIL) 10 MG tablet Take 1 tablet (10 mg total) by mouth 3 (three) times daily as needed for muscle spasms. 01/29/16  Yes Veryl Speak, MD  naproxen (NAPROSYN) 500 MG tablet Take 1 tablet (500 mg total) by mouth 2 (two) times daily. 01/29/16  Yes Veryl Speak, MD    Family History Family History  Problem Relation Age of Onset  . Healthy Mother   . Healthy Father   . Healthy Sister   . Healthy Maternal Grandmother   . Healthy Paternal Grandmother   . Healthy Paternal Grandfather   . Healthy Sister      Social History Social History  Substance Use Topics  . Smoking status: Current Every Day Smoker    Types: Cigarettes  . Smokeless tobacco: Never Used  . Alcohol use Yes     Comment: occ     Allergies   Patient has no known allergies.   Review of Systems Review of Systems  Constitutional: Negative for fever.  Respiratory: Negative for cough and shortness of breath.   Cardiovascular: Positive for chest pain. Negative for palpitations and orthopnea.  Gastrointestinal: Negative for nausea and vomiting.  Neurological: Negative for numbness and headaches.     Physical Exam Triage Vital Signs ED Triage Vitals  Enc Vitals Group     BP 02/01/16 1753 113/66     Pulse Rate 02/01/16 1753 61     Resp 02/01/16 1753 16     Temp 02/01/16 1753 98.3 F (36.8 C)     Temp Source 02/01/16 1753 Oral     SpO2 02/01/16 1753 100 %     Weight --      Height --      Head Circumference --      Peak Flow --      Pain Score 02/01/16 1755 10     Pain Loc --      Pain Edu? --      Excl. in [place]? --  No data found.   Updated Vital Signs BP 113/66 (BP Location: Left Arm)   Pulse 61   Temp 98.3 F (36.8 C) (Oral)   Resp 16   LMP 01/28/2016   SpO2 100%   Visual Acuity Right Eye Distance:   Left Eye Distance:   Bilateral Distance:    Right Eye Near:   Left Eye Near:    Bilateral Near:     Physical Exam  Constitutional: She appears well-developed and well-nourished. No distress.  Cardiovascular: Normal rate, regular rhythm and intact distal pulses.   No murmur heard. Pulmonary/Chest: Effort normal and breath sounds normal. No respiratory distress. She has no wheezes. She exhibits no mass, no tenderness, no bony tenderness, no crepitus, no deformity and no swelling.  Nursing note and vitals reviewed.    UC Treatments / Results  Labs (all labs ordered are listed, but only abnormal results are displayed) Labs Reviewed - No data to display  EKG  EKG Interpretation None        Radiology No results found.  Procedures Procedures (including critical care time)  Medications Ordered in UC Medications - No data to display   Initial Impression / Assessment and Plan / UC Course  I have reviewed the triage vital signs and the nursing notes.  Pertinent labs & imaging results that were available during my care of the patient were reviewed by me and considered in my medical decision making (see chart for details).  Clinical Course as of Jan 31 1825  Nancy Fetter Feb 01, 2016  1825 Chest exam benign. Currently asymptomatic Likely pain from overuse. Limit lifting of heavy object. Ibuprofen prescribed prn pain. Return precaution discussed.  [KE]    Clinical Course User Index [KE] Kinnie Feil, MD     Final Clinical Impressions(s) / UC Diagnoses   Final diagnoses:  None  Atypical chest pain    New Prescriptions New Prescriptions   No medications on file     Kinnie Feil, MD 02/01/16 1827

## 2016-03-06 ENCOUNTER — Encounter (HOSPITAL_COMMUNITY): Payer: Self-pay | Admitting: *Deleted

## 2016-03-06 ENCOUNTER — Emergency Department (HOSPITAL_COMMUNITY)
Admission: EM | Admit: 2016-03-06 | Discharge: 2016-03-06 | Disposition: A | Discharge status: Home / Self Care | Payer: Managed Care, Other (non HMO) | Attending: Emergency Medicine | Admitting: Emergency Medicine

## 2016-03-06 DIAGNOSIS — F17210 Nicotine dependence, cigarettes, uncomplicated: Secondary | ICD-10-CM | POA: Diagnosis not present

## 2016-03-06 DIAGNOSIS — B349 Viral infection, unspecified: Secondary | ICD-10-CM

## 2016-03-06 DIAGNOSIS — M549 Dorsalgia, unspecified: Secondary | ICD-10-CM | POA: Diagnosis present

## 2016-03-06 LAB — COMPREHENSIVE METABOLIC PANEL
ALK PHOS: 40 U/L (ref 38–126)
ALT: 17 U/L (ref 14–54)
ANION GAP: 6 (ref 5–15)
AST: 22 U/L (ref 15–41)
Albumin: 3.6 g/dL (ref 3.5–5.0)
BILIRUBIN TOTAL: 0.7 mg/dL (ref 0.3–1.2)
BUN: 6 mg/dL (ref 6–20)
CALCIUM: 9 mg/dL (ref 8.9–10.3)
CO2: 28 mmol/L (ref 22–32)
Chloride: 104 mmol/L (ref 101–111)
Creatinine, Ser: 0.88 mg/dL (ref 0.44–1.00)
GFR calc non Af Amer: >60 mL/min (ref >60)
Glucose, Bld: 89 mg/dL (ref 65–99)
Potassium: 3.9 mmol/L (ref 3.5–5.1)
SODIUM: 138 mmol/L (ref 135–145)
TOTAL PROTEIN: 6.9 g/dL (ref 6.5–8.1)

## 2016-03-06 LAB — CBC WITH DIFFERENTIAL/PLATELET
BASOS ABS: 0 10*3/uL (ref 0.0–0.1)
BASOS PCT: 0 %
Eosinophils Absolute: 0 10*3/uL (ref 0.0–0.7)
Eosinophils Relative: 0 %
HEMATOCRIT: 33.7 % — AB (ref 36.0–46.0)
HEMOGLOBIN: 10.8 g/dL — AB (ref 12.0–15.0)
Lymphocytes Relative: 19 %
Lymphs Abs: 1 10*3/uL (ref 0.7–4.0)
MCH: 26.3 pg (ref 26.0–34.0)
MCHC: 32 g/dL (ref 30.0–36.0)
MCV: 82.2 fL (ref 78.0–100.0)
Monocytes Absolute: 0.5 10*3/uL (ref 0.1–1.0)
Monocytes Relative: 9 %
NEUTROS ABS: 4 10*3/uL (ref 1.7–7.7)
NEUTROS PCT: 72 %
Platelets: 274 10*3/uL (ref 150–400)
RBC: 4.1 MIL/uL (ref 3.87–5.11)
RDW: 17.3 % — ABNORMAL HIGH (ref 11.5–15.5)
WBC: 5.5 10*3/uL (ref 4.0–10.5)

## 2016-03-06 LAB — URINALYSIS, ROUTINE W REFLEX MICROSCOPIC
Bilirubin Urine: NEGATIVE
Glucose, UA: NEGATIVE mg/dL
Hgb urine dipstick: NEGATIVE
KETONES UR: NEGATIVE mg/dL
LEUKOCYTES UA: NEGATIVE
NITRITE: NEGATIVE
PH: 8 (ref 5.0–8.0)
Protein, ur: NEGATIVE mg/dL
SPECIFIC GRAVITY, URINE: 1.019 (ref 1.005–1.030)

## 2016-03-06 LAB — I-STAT CG4 LACTIC ACID, ED: LACTIC ACID, VENOUS: 0.64 mmol/L (ref 0.5–1.9)

## 2016-03-06 LAB — POC URINE PREG, ED: Preg Test, Ur: NEGATIVE

## 2016-03-06 MED ORDER — ACETAMINOPHEN 325 MG PO TABS
650.0000 mg | ORAL_TABLET | Freq: Once | ORAL | Status: AC
  Administered 2016-03-06: 650 mg via ORAL
  Filled 2016-03-06: qty 2

## 2016-03-06 MED ORDER — IBUPROFEN 400 MG PO TABS
600.0000 mg | ORAL_TABLET | Freq: Once | ORAL | Status: AC
  Administered 2016-03-06: 600 mg via ORAL
  Filled 2016-03-06: qty 1

## 2016-03-06 NOTE — Discharge Instructions (Signed)
Follow up with your doctor in regards to your hospital visit. Return to the emergency department if symptoms worsen, become progressive, or become more concerning. Read below to learn more about your diagnosis & reasons to return. Alternate between ibuprofen and Tylenol to treat your fever    HOME CARE  Rest.  Drink enough fluids to keep your urine clear or pale yellow.  Wash your hands often. Do this after you blow your nose, after you go to the bathroom, and before you touch food.   GET HELP RIGHT AWAY IF:  You have shortness of breath while resting.  You suddenly feel dizzy.  You feel confused.  You have a hard time breathing.  You get a bad neck pain or stiffness.  You get a bad headache, face pain, or earache.  You throw up (vomit) a lot and often.  You have a fever > 101 that persists   MAKE SURE YOU:  Understand these instructions.  Will watch your condition.  Will get help right away if you are not doing well or get worse.

## 2016-03-06 NOTE — ED Notes (Signed)
Papers reviewed and patient verbalizes understanding   

## 2016-03-06 NOTE — ED Provider Notes (Signed)
[place] [facility] DEPT Provider Note   CSN: [id number] Arrival date & time: 03/06/16  1144     History   Chief Complaint Chief Complaint  Patient presents with  . Generalized Body Aches  . Back Pain    HPI Annette Greene is a 25 y.o. female.  The history is provided by the patient and medical records. No language interpreter was used.  Back Pain   Associated symptoms include a fever and abdominal pain. Pertinent negatives include no headaches and no dysuria.   Annette Greene is an otherwise healthy 25 y.o. female  who presents to the Emergency Department complaining of body aches of the back, ribs, chest and hips which began yesterday. Also endorses central abdominal cramping which she states is typical for her when she and her menstrual cycle. No medications taken prior to arrival for symptoms. No alleviating or aggravating factors noted. No sick contacts. No shortness of breath, headaches, congestion, cough, dysuria, vaginal discharge, nausea, vomiting. Unsure of if she had a fever, but has febrile in triage (101.2).   History reviewed. No pertinent past medical history.  Patient Active Problem List   Diagnosis Date Noted  . Chest pain 02/01/2016    Past Surgical History:  Procedure Laterality Date  . TONSILLECTOMY      OB History    No data available       Home Medications    Prior to Admission medications   Medication Sig Start Date End Date Taking? Authorizing Provider  cyclobenzaprine (FLEXERIL) 10 MG tablet Take 1 tablet (10 mg total) by mouth 3 (three) times daily as needed for muscle spasms. Patient not taking: Reported on 03/06/2016 01/29/16   Veryl Speak, MD  erythromycin ophthalmic ointment Place a 1/2 inch ribbon of ointment into the lower eyelid twice daily for one week. Patient not taking: Reported on 03/06/2016 02/01/16   Harlin Heys, MD  ibuprofen (ADVIL,MOTRIN) 400 MG tablet Take 1 tablet (400 mg total) by mouth every 6 (six) hours as  needed. Patient not taking: Reported on 03/06/2016 02/01/16   Kinnie Feil, MD  naproxen (NAPROSYN) 500 MG tablet Take 1 tablet (500 mg total) by mouth 2 (two) times daily. Patient not taking: Reported on 03/06/2016 01/29/16   Veryl Speak, MD    Family History Family History  Problem Relation Age of Onset  . Healthy Mother   . Healthy Father   . Healthy Sister   . Healthy Maternal Grandmother   . Healthy Paternal Grandmother   . Healthy Paternal Grandfather   . Healthy Sister     Social History Social History  Substance Use Topics  . Smoking status: Current Every Day Smoker    Types: Cigarettes  . Smokeless tobacco: Never Used  . Alcohol use Yes     Comment: occ     Allergies   Patient has no known allergies.   Review of Systems Review of Systems  Constitutional: Positive for fever. Negative for chills.  HENT: Negative for congestion and sore throat.   Eyes: Negative for visual disturbance.  Respiratory: Negative for cough and shortness of breath.   Cardiovascular: Negative for palpitations.  Gastrointestinal: Positive for abdominal pain. Negative for constipation, diarrhea, nausea and vomiting.  Genitourinary: Negative for dysuria.  Musculoskeletal: Positive for arthralgias and myalgias.  Skin: Negative for rash.  Neurological: Negative for headaches.     Physical Exam Updated Vital Signs BP 133/68 (BP Location: Right Arm)   Pulse 77   Temp 101.3 F (38.5 C) (Oral)  Resp 16   LMP 02/28/2016   SpO2 100%   Physical Exam  Constitutional: She is oriented to person, place, and time. She appears well-developed and well-nourished. No distress.  HENT:  Head: Normocephalic and atraumatic.  OP with no erythema, exudates or tonsillar hypertrophy.   Cardiovascular: Normal rate, regular rhythm and normal heart sounds.   No murmur heard. Pulmonary/Chest: Effort normal and breath sounds normal. No respiratory distress.  Abdominal: Soft. Bowel sounds are  normal. She exhibits no distension. There is no tenderness.  Musculoskeletal: Normal range of motion.  Diffuse tenderness to paraspinal musculature and low back.   Neurological: She is alert and oriented to person, place, and time.  Skin: Skin is warm and dry.  Nursing note and vitals reviewed.    ED Treatments / Results  Labs (all labs ordered are listed, but only abnormal results are displayed) Labs Reviewed  COMPREHENSIVE METABOLIC PANEL  CBC WITH DIFFERENTIAL/PLATELET  URINALYSIS, ROUTINE W REFLEX MICROSCOPIC  I-STAT CG4 LACTIC ACID, ED  POC URINE PREG, ED    EKG  EKG Interpretation None       Radiology No results found.  Procedures Procedures (including critical care time)  Medications Ordered in ED Medications  acetaminophen (TYLENOL) tablet 650 mg (not administered)     Initial Impression / Assessment and Plan / ED Course  I have reviewed the triage vital signs and the nursing notes.  Pertinent labs & imaging results that were available during my care of the patient were reviewed by me and considered in my medical decision making (see chart for details).  Clinical Course    Annette Greene is a 25 y.o. female who presents to ED for body aches which began yesterday. Temperature of  101.2 in triage. Labs reviewed and unremarkable. Patient is non-toxic appearing. Symptoms likely 2/2 viral illness. PCP follow up  strongly encouraged. Reasons to return to the ER were discussed and all questions were answered.   Patient discussed with Dr. Ashok Cordia who agrees with treatment plan.    Final Clinical Impressions(s) / ED Diagnoses   Final diagnoses:  None    New Prescriptions New Prescriptions   No medications on file     [place], PA-C 03/06/16 [place], MD 03/06/16 1434

## 2016-03-06 NOTE — ED Triage Notes (Addendum)
Pt woke this am with aching pain in her back and hips and soreness in her ribs with movement.  Pt describes it as an "ache" and states that it is severe.  Pt denies any trauma, no weakness or numbness in her legs, feet or hands.  Pt describes a tingling in her back.  Pt is ambulatory. Fever in triage.

## 2016-03-06 NOTE — ED Triage Notes (Signed)
Pt reports 3 days of nausea . ABD cramping and back pain that radiated into Bil. Groin region.  Pt states"I interier upper body feels like a snake is compressing it. "

## 2016-03-06 NOTE — ED Notes (Signed)
Placed mask on pt

## 2016-03-22 NOTE — L&D Delivery Note (Signed)
[facility] Delivery Note  At 11:02 AM a viable and healthy female Driscilla Grammes) was delivered via Vaginal, Spontaneous Delivery (Presentation: Vertex; OA  ).  APGAR: 6, 7, 8.; weight 0.8 kg .   Placenta status: Delivered spontaneously and intact.  Cord: 3 vessels with the following complications:none .  Cord pH: 7.387  Anesthesia: None Episiotomy: None Lacerations: None Suture Repair: NA Est. Blood Loss (mL): 275  Mom to postpartum.  Baby to NICU.  Hedaya Latendresse V 12/08/2016, 11:21 AM

## 2016-05-08 ENCOUNTER — Emergency Department (HOSPITAL_BASED_OUTPATIENT_CLINIC_OR_DEPARTMENT_OTHER)
Admission: EM | Admit: 2016-05-08 | Discharge: 2016-05-08 | Disposition: A | Discharge status: Home / Self Care | Payer: Managed Care, Other (non HMO) | Attending: Physician Assistant | Admitting: Physician Assistant

## 2016-05-08 ENCOUNTER — Encounter (HOSPITAL_BASED_OUTPATIENT_CLINIC_OR_DEPARTMENT_OTHER): Payer: Self-pay | Admitting: *Deleted

## 2016-05-08 ENCOUNTER — Emergency Department (HOSPITAL_BASED_OUTPATIENT_CLINIC_OR_DEPARTMENT_OTHER): Payer: Managed Care, Other (non HMO)

## 2016-05-08 DIAGNOSIS — S4992XA Unspecified injury of left shoulder and upper arm, initial encounter: Secondary | ICD-10-CM | POA: Diagnosis present

## 2016-05-08 DIAGNOSIS — X509XXA Other and unspecified overexertion or strenuous movements or postures, initial encounter: Secondary | ICD-10-CM | POA: Diagnosis not present

## 2016-05-08 DIAGNOSIS — T1490XA Injury, unspecified, initial encounter: Secondary | ICD-10-CM

## 2016-05-08 DIAGNOSIS — F17210 Nicotine dependence, cigarettes, uncomplicated: Secondary | ICD-10-CM | POA: Diagnosis not present

## 2016-05-08 DIAGNOSIS — Y990 Civilian activity done for income or pay: Secondary | ICD-10-CM | POA: Diagnosis not present

## 2016-05-08 DIAGNOSIS — S46912A Strain of unspecified muscle, fascia and tendon at shoulder and upper arm level, left arm, initial encounter: Secondary | ICD-10-CM | POA: Insufficient documentation

## 2016-05-08 DIAGNOSIS — Y9389 Activity, other specified: Secondary | ICD-10-CM | POA: Insufficient documentation

## 2016-05-08 DIAGNOSIS — Y929 Unspecified place or not applicable: Secondary | ICD-10-CM | POA: Diagnosis not present

## 2016-05-08 MED ORDER — IBUPROFEN 800 MG PO TABS: 800.0000 mg | ORAL_TABLET | Freq: Three times a day (TID) | ORAL | 0 refills | Status: DC

## 2016-05-08 NOTE — ED Notes (Signed)
ED Provider at bedside. 

## 2016-05-08 NOTE — ED Provider Notes (Signed)
[facility] DEPT [facility] Provider Note   CSN: [id number] Arrival date & time: 05/08/16  1641  By signing my name below, I, Julien Nordmann, attest that this documentation has been prepared under the direction and in the presence of Quincy Carnes, PA-C.  Electronically Signed: Julien Nordmann, ED Scribe. 05/08/16. 6:10 PM.    History   Chief Complaint Chief Complaint  Patient presents with  . Shoulder Pain    The history is provided by the patient. No language interpreter was used.   HPI Comments: Annette Greene is a 26 y.o. female who presents to the Emergency Department complaining of moderate, left shoulder pain s/p an injury that occurred at work [employer]. Pt says she was pulling a "cage" at work when she injured her left shoulder, stating that it became stiff and contracted. She expresses increased pain when moving her arm behind her. She is able to move her arm without difficulty. Pt denies numbness, tingling, or weakness in her left arm.   History reviewed. No pertinent past medical history.  Patient Active Problem List   Diagnosis Date Noted  . Chest pain 02/01/2016    Past Surgical History:  Procedure Laterality Date  . TONSILLECTOMY      OB History    No data available       Home Medications    Prior to Admission medications   Not on File    Family History Family History  Problem Relation Age of Onset  . Healthy Mother   . Healthy Father   . Healthy Sister   . Healthy Maternal Grandmother   . Healthy Paternal Grandmother   . Healthy Paternal Grandfather   . Healthy Sister     Social History Social History  Substance Use Topics  . Smoking status: Current Every Day Smoker    Types: Cigarettes  . Smokeless tobacco: Never Used  . Alcohol use Yes     Comment: occ     Allergies   Patient has no known allergies.   Review of Systems Review of Systems  Musculoskeletal: Positive for arthralgias.  Neurological: Negative for weakness and numbness.    All other systems reviewed and are negative.    Physical Exam Updated Vital Signs BP 120/56 (BP Location: Left Arm)   Pulse 60   Temp 98.3 F (36.8 C) (Oral)   Resp 17   Ht 5\' 7"  (1.702 m)   Wt 235 lb (106.6 kg)   LMP 05/08/2016   SpO2 100%   BMI 36.81 kg/m   Physical Exam  Constitutional: She is oriented to person, place, and time. She appears well-developed and well-nourished.  HENT:  Head: Normocephalic and atraumatic.  Mouth/Throat: Oropharynx is clear and moist.  Eyes: Conjunctivae and EOM are normal. Pupils are equal, round, and reactive to light.  Neck: Normal range of motion.  Cardiovascular: Normal rate.   Pulmonary/Chest: Effort normal.  Musculoskeletal: Normal range of motion.  Left shoulder normal in appearance without swelling or bony deformity, full range of motion maintained, some pain with full extension of the left shoulder, normal grip strength, hand is warm and well-perfused  Neurological: She is alert and oriented to person, place, and time.  Skin: Skin is warm and dry.  Psychiatric: She has a normal mood and affect.  Nursing note and vitals reviewed.    ED Treatments / Results  DIAGNOSTIC STUDIES: Oxygen Saturation is 100% on RA, normal by my interpretation.  COORDINATION OF CARE:  5:56 PM Discussed treatment plan with pt at bedside  and pt agreed to plan.  Labs (all labs ordered are listed, but only abnormal results are displayed) Labs Reviewed - No data to display  EKG  EKG Interpretation None       Radiology Dg Shoulder Left  Result Date: 05/08/2016 CLINICAL DATA:  Felt like left shoulder popped out while pulling something heavy at work. Soreness and decreased range of motion at left shoulder. Initial encounter. EXAM: LEFT SHOULDER - 2+ VIEW COMPARISON:  Left shoulder radiographs performed 02/01/2016 FINDINGS: There is no evidence of fracture or dislocation. The left humeral head is seated within the glenoid fossa. The  acromioclavicular joint is unremarkable in appearance. No significant soft tissue abnormalities are seen. The visualized portions of the left lung are clear. IMPRESSION: No evidence of fracture or dislocation. Electronically Signed   By: Garald Balding M.D.   On: 05/08/2016 17:24    Procedures Procedures (including critical care time)  Medications Ordered in ED Medications - No data to display   Initial Impression / Assessment and Plan / ED Course  I have reviewed the triage vital signs and the nursing notes.  Pertinent labs & imaging results that were available during my care of the patient were reviewed by me and considered in my medical decision making (see chart for details).  26 year old female here with left shoulder pain after pulling a "cage" at work.  Reports left shoulder has been hurting since.  No deformities noted on exam here.  Full ROM of the shoulder, some mild pain with full extension of the shoulder.  Arm is NVI.  X-ray negative.  Suspect shoulder strain.  Recommended rest, +/- ice/heat, NSAIDs.  Feel she is ok to return to work on Monday as scheduled.  Discussed plan with patient, she acknowledged understanding and agreed with plan of care.  Return precautions given for new or worsening symptoms.  Final Clinical Impressions(s) / ED Diagnoses   Final diagnoses:  Left shoulder strain, initial encounter    New Prescriptions Discharge Medication List as of 05/08/2016  6:02 PM    START taking these medications   Details  ibuprofen (ADVIL,MOTRIN) 800 MG tablet Take 1 tablet (800 mg total) by mouth 3 (three) times daily., Starting Sat 05/08/2016, Print       I personally performed the services described in this documentation, which was scribed in my presence. The recorded information has been reviewed and is accurate.   Larene Pickett, PA-C 05/08/16 1939    [place], MD 05/08/16 2253

## 2016-05-08 NOTE — Discharge Instructions (Signed)
May return to work on Monday. Take motrin to help with pain. Follow-up with your primary care doctor if any ongoing issues.

## 2016-05-08 NOTE — ED Triage Notes (Signed)
Right shoulder pain since this morning.  Injury at work while pulling something heavy.  ROM intact.  No obvious deformity.

## 2016-06-13 ENCOUNTER — Encounter (HOSPITAL_BASED_OUTPATIENT_CLINIC_OR_DEPARTMENT_OTHER): Payer: Self-pay | Admitting: Emergency Medicine

## 2016-06-13 ENCOUNTER — Emergency Department (HOSPITAL_BASED_OUTPATIENT_CLINIC_OR_DEPARTMENT_OTHER)
Admission: EM | Admit: 2016-06-13 | Discharge: 2016-06-14 | Disposition: A | Discharge status: Home / Self Care | Payer: Managed Care, Other (non HMO) | Attending: Emergency Medicine | Admitting: Emergency Medicine

## 2016-06-13 DIAGNOSIS — M62830 Muscle spasm of back: Secondary | ICD-10-CM | POA: Insufficient documentation

## 2016-06-13 DIAGNOSIS — F17210 Nicotine dependence, cigarettes, uncomplicated: Secondary | ICD-10-CM | POA: Insufficient documentation

## 2016-06-13 MED ORDER — METHOCARBAMOL 500 MG PO TABS
1000.0000 mg | ORAL_TABLET | Freq: Once | ORAL | Status: AC
Start: 2016-06-14 — End: 2016-06-14
  Administered 2016-06-14: 1000 mg via ORAL
  Filled 2016-06-13: qty 2

## 2016-06-13 MED ORDER — NAPROXEN 250 MG PO TABS
500.0000 mg | ORAL_TABLET | Freq: Once | ORAL | Status: AC
  Administered 2016-06-14: 500 mg via ORAL
  Filled 2016-06-13: qty 2

## 2016-06-13 NOTE — ED Notes (Signed)
Pt ambulated to restroom. 

## 2016-06-13 NOTE — ED Provider Notes (Signed)
[facility] Provider Note   CSN: [id number] Arrival date & time: 06/13/16  2231   By signing my name below, I, Neta Mends, attest that this documentation has been prepared under the direction and in the presence of Suzzane Quilter, MD . Electronically Signed: Neta Mends, ED Scribe. 06/13/2016. 11:54 PM.   History   Chief Complaint Chief Complaint  Patient presents with  . Back Pain    The history is provided by the patient. No language interpreter was used.  Back Pain   This is a recurrent problem. The current episode started more than 2 days ago. The problem occurs constantly. The problem has not changed since onset.The pain is associated with no known injury. The pain is present in the sacro-iliac joint. The pain does not radiate. The pain is moderate. The pain is the same all the time. Pertinent negatives include no chest pain, no fever, no numbness, no weight loss, no headaches, no abdominal pain, no abdominal swelling, no bowel incontinence, no perianal numbness, no bladder incontinence, no dysuria, no pelvic pain, no leg pain, no paresthesias, no paresis, no tingling and no weakness. She has tried NSAIDs for the symptoms. The treatment provided no relief. Risk factors include obesity.   HPI Comments:  Annette Greene is a 26 y.o. female who presents to the Emergency Department complaining of constant back pain x 3 days. Pt describes the pain as spasms. Pt complains of associated numbness and tingling. Pt denies other associated symptoms.   History reviewed. No pertinent past medical history.  Patient Active Problem List   Diagnosis Date Noted  . Chest pain 02/01/2016    Past Surgical History:  Procedure Laterality Date  . TONSILLECTOMY      OB History    No data available       Home Medications    Prior to Admission medications   Medication Sig Start Date End Date Taking? Authorizing Provider  ibuprofen (ADVIL,MOTRIN) 800 MG tablet Take 1  tablet (800 mg total) by mouth 3 (three) times daily. 05/08/16   Larene Pickett, PA-C    Family History Family History  Problem Relation Age of Onset  . Healthy Mother   . Healthy Father   . Healthy Sister   . Healthy Maternal Grandmother   . Healthy Paternal Grandmother   . Healthy Paternal Grandfather   . Healthy Sister     Social History Social History  Substance Use Topics  . Smoking status: Current Every Day Smoker    Packs/day: 0.50    Types: Cigarettes  . Smokeless tobacco: Never Used  . Alcohol use Yes     Comment: occ     Allergies   Patient has no known allergies.   Review of Systems Review of Systems  Constitutional: Negative for fever and weight loss.  Cardiovascular: Negative for chest pain.  Gastrointestinal: Negative for abdominal pain, bowel incontinence, diarrhea and vomiting.  Genitourinary: Negative for bladder incontinence, difficulty urinating, dysuria and pelvic pain.  Musculoskeletal: Positive for back pain. Negative for arthralgias, gait problem, joint swelling, myalgias and neck pain.  Neurological: Negative for dizziness, tingling, weakness, numbness, headaches and paresthesias.  All other systems reviewed and are negative.    Physical Exam Updated Vital Signs BP (!) 113/57 (BP Location: Left Arm)   Pulse 75   Temp 98.3 F (36.8 C) (Oral)   Resp 17   Ht 5\' 7"  (1.702 m)   Wt 230 lb (104.3 kg)   LMP 06/06/2016 (Exact  Date)   SpO2 100%   BMI 36.02 kg/m   Physical Exam  Constitutional: She is oriented to person, place, and time. She appears well-developed and well-nourished. No distress.  HENT:  Head: Normocephalic and atraumatic.  Mouth/Throat: Oropharynx is clear and moist. No oropharyngeal exudate.  Trachea midline  Eyes: Conjunctivae and EOM are normal. Pupils are equal, round, and reactive to light.  Neck: Trachea normal and normal range of motion. Neck supple. No JVD present. Carotid bruit is not present.  Cardiovascular:  Normal rate and regular rhythm.  Exam reveals no gallop and no friction rub.   No murmur heard. Pulmonary/Chest: Effort normal and breath sounds normal. No stridor. She has no wheezes. She has no rales.  Abdominal: Soft. Bowel sounds are normal. She exhibits no mass. There is no tenderness. There is no rebound and no guarding.  Musculoskeletal: Normal range of motion.       Cervical back: Normal.       Thoracic back: Normal.       Lumbar back: Normal.  Intact gait. Intact L5S1, intact perineal sensation. No stepoff, crepitance to C, T or L spine. Muscle spasm to left lumbar paraspinal.   Lymphadenopathy:    She has no cervical adenopathy.  Neurological: She is alert and oriented to person, place, and time. She has normal reflexes. She displays normal reflexes. No cranial nerve deficit. She exhibits normal muscle tone. Coordination normal. GCS eye subscore is 4. GCS verbal subscore is 5. GCS motor subscore is 6.  Reflex Scores:      Tricep reflexes are 2+ on the right side and 2+ on the left side.      Bicep reflexes are 2+ on the right side and 2+ on the left side.      Brachioradialis reflexes are 2+ on the right side and 2+ on the left side.      Patellar reflexes are 2+ on the right side and 2+ on the left side.      Achilles reflexes are 2+ on the right side and 2+ on the left side. Intact L5/s1 intact perineal sensation intact gait  Skin: Skin is warm and dry. She is not diaphoretic.  Psychiatric: She has a normal mood and affect. Her behavior is normal.     ED Treatments / Results   Vitals:   06/13/16 2247 06/14/16 0051  BP: (!) 113/57 (!) 121/59  Pulse: 75 74  Resp: 17 20  Temp: 98.3 F (36.8 C)     DIAGNOSTIC STUDIES:  Oxygen Saturation is 100% on RA, normal by my interpretation.    COORDINATION OF CARE:  11:54 PM Discussed treatment plan with pt at bedside and pt agreed to plan.   Procedures Procedures (including critical care time)  Medications Ordered in  ED Medications  naproxen (NAPROSYN) tablet 500 mg (500 mg Oral Given 06/14/16 0037)  methocarbamol (ROBAXIN) tablet 1,000 mg (1,000 mg Oral Given 06/14/16 0037)     Final Clinical Impressions(s) / ED Diagnoses  Spasm:  The patient is nontoxic-appearing on exam and vital signs are within normal limits. Return for weakness, numbness, anability to pass urine, leakage of stool difficulty ambulating, vomiting, fevers intractable pain or any concerns.  After history, exam, and medical workup I feel the patient has been appropriately medically screened and is safe for discharge home. Pertinent diagnoses were discussed with the patient. Patient was given return precautions.  I personally performed the services described in this documentation, which was scribed in my presence. The recorded  information has been reviewed and is accurate.     Veatrice Kells, MD 06/14/16 [id number]

## 2016-06-13 NOTE — ED Triage Notes (Addendum)
Patient reports mid to lower back pain which began 3 days ago.  Denies injury, dysuria, hematuria.  Patient ambulatory to triage room in NAD.

## 2016-06-14 ENCOUNTER — Encounter (HOSPITAL_BASED_OUTPATIENT_CLINIC_OR_DEPARTMENT_OTHER): Payer: Self-pay | Admitting: Emergency Medicine

## 2016-06-14 LAB — URINALYSIS, MICROSCOPIC (REFLEX)

## 2016-06-14 LAB — URINALYSIS, ROUTINE W REFLEX MICROSCOPIC
Bilirubin Urine: NEGATIVE
GLUCOSE, UA: NEGATIVE mg/dL
Ketones, ur: NEGATIVE mg/dL
Nitrite: NEGATIVE
PH: 5.5 (ref 5.0–8.0)
Protein, ur: NEGATIVE mg/dL
SPECIFIC GRAVITY, URINE: 1.029 (ref 1.005–1.030)

## 2016-06-14 LAB — PREGNANCY, URINE: Preg Test, Ur: NEGATIVE

## 2016-06-14 MED ORDER — METHOCARBAMOL 500 MG PO TABS: 500.0000 mg | ORAL_TABLET | Freq: Two times a day (BID) | ORAL | 0 refills | Status: DC

## 2016-06-14 MED ORDER — NAPROXEN 500 MG PO TABS: 500.0000 mg | ORAL_TABLET | Freq: Two times a day (BID) | ORAL | 0 refills | Status: DC

## 2016-06-14 NOTE — ED Notes (Signed)
Pt discharged to home with family. NAD.  

## 2016-06-26 ENCOUNTER — Emergency Department (HOSPITAL_BASED_OUTPATIENT_CLINIC_OR_DEPARTMENT_OTHER)
Admission: EM | Admit: 2016-06-26 | Discharge: 2016-06-27 | Disposition: A | Discharge status: Home / Self Care | Payer: Managed Care, Other (non HMO) | Attending: Emergency Medicine | Admitting: Emergency Medicine

## 2016-06-26 ENCOUNTER — Encounter (HOSPITAL_BASED_OUTPATIENT_CLINIC_OR_DEPARTMENT_OTHER): Payer: Self-pay | Admitting: Emergency Medicine

## 2016-06-26 DIAGNOSIS — M545 Low back pain, unspecified: Secondary | ICD-10-CM

## 2016-06-26 DIAGNOSIS — Z791 Long term (current) use of non-steroidal anti-inflammatories (NSAID): Secondary | ICD-10-CM | POA: Diagnosis not present

## 2016-06-26 DIAGNOSIS — F17210 Nicotine dependence, cigarettes, uncomplicated: Secondary | ICD-10-CM | POA: Insufficient documentation

## 2016-06-26 MED ORDER — NAPROXEN 500 MG PO TABS: 500.0000 mg | ORAL_TABLET | Freq: Two times a day (BID) | ORAL | 0 refills | Status: DC

## 2016-06-26 MED ORDER — CYCLOBENZAPRINE HCL 10 MG PO TABS: 10.0000 mg | ORAL_TABLET | Freq: Two times a day (BID) | ORAL | 0 refills | Status: DC | PRN

## 2016-06-26 NOTE — ED Triage Notes (Signed)
PT presents to ED with complaints of lower back pain. PT was here 2 weeks ago for the same and states the pain is coming back.

## 2016-06-26 NOTE — ED Provider Notes (Signed)
[facility] Provider Note   CSN: [id number] Arrival date & time: 06/26/16  2333  By signing my name below, I, Annette Greene, attest that this documentation has been prepared under the direction and in the presence of Veryl Speak, MD . Electronically Signed: Dolores Greene, Scribe. 06/26/2016. 11:43 PM.  History   Chief Complaint Chief Complaint  Patient presents with  . Back Pain   The history is provided by the patient. No language interpreter was used.    HPI Comments:  Annette Greene is a 26 y.o. female who presents to the Emergency Department complaining of constant, mild back pain onset >2 months. She describes her pain as across her lower back and occasionally radiating into the middle of her back. She reports associated paraesthesia.  Pt works for the [employer] and frequently lifts heavy packages for work which exacerbated her symptoms. Denies any incontinence, difficulty urinating or weakness.   History reviewed. No pertinent past medical history.  Patient Active Problem List   Diagnosis Date Noted  . Chest pain 02/01/2016    Past Surgical History:  Procedure Laterality Date  . TONSILLECTOMY      OB History    No data available       Home Medications    Prior to Admission medications   Medication Sig Start Date End Date Taking? Authorizing Provider  ibuprofen (ADVIL,MOTRIN) 800 MG tablet Take 1 tablet (800 mg total) by mouth 3 (three) times daily. 05/08/16   Larene Pickett, PA-C  methocarbamol (ROBAXIN) 500 MG tablet Take 1 tablet (500 mg total) by mouth 2 (two) times daily. 06/14/16   April Palumbo, MD  naproxen (NAPROSYN) 500 MG tablet Take 1 tablet (500 mg total) by mouth 2 (two) times daily. 06/14/16   Veatrice Kells, MD    Family History Family History  Problem Relation Age of Onset  . Healthy Mother   . Healthy Father   . Healthy Sister   . Healthy Maternal Grandmother   . Healthy Paternal Grandmother   . Healthy Paternal Grandfather   .  Healthy Sister     Social History Social History  Substance Use Topics  . Smoking status: Current Every Day Smoker    Packs/day: 0.50    Types: Cigarettes  . Smokeless tobacco: Never Used  . Alcohol use Yes     Comment: occ     Allergies   Patient has no known allergies.   Review of Systems Review of Systems  Genitourinary: Negative for difficulty urinating.       Negative for Incontinence   Musculoskeletal: Positive for back pain.  Neurological: Negative for weakness.       Positive for Paraesthesia  All other systems reviewed and are negative.    Physical Exam Updated Vital Signs BP 107/65 (BP Location: Left Arm)   Pulse (!) 59   Temp 97.9 F (36.6 C) (Oral)   Resp 20   Wt 230 lb (104.3 kg)   LMP 06/06/2016 (Exact Date)   SpO2 99%   BMI 36.02 kg/m   Physical Exam  Constitutional: She is oriented to person, place, and time. She appears well-developed and well-nourished. No distress.  HENT:  Head: Normocephalic and atraumatic.  Eyes: Conjunctivae are normal.  Cardiovascular: Normal rate.   Pulmonary/Chest: Effort normal.  Abdominal: She exhibits no distension.  Musculoskeletal: She exhibits tenderness.  TTP in the soft tissues of the lower lumbar region.   Neurological: She is alert and oriented to person, place, and time.  DTR are 1+ and symmetrical in both lower extremities. Strength 5/5 in both lower extremities. Able to walk on heels and toes without difficulty.   Skin: Skin is warm and dry.  Psychiatric: She has a normal mood and affect.  Nursing note and vitals reviewed.    ED Treatments / Results  DIAGNOSTIC STUDIES:  Oxygen Saturation is 99% on RA, normal by my interpretation.    COORDINATION OF CARE:  11:52 PM Discussed treatment plan with pt at bedside which includes establishment with a PCP and pt agreed to plan.  Labs (all labs ordered are listed, but only abnormal results are displayed) Labs Reviewed - No data to display  EKG   EKG Interpretation None       Radiology No results found.  Procedures Procedures (including critical care time)  Medications Ordered in ED Medications - No data to display   Initial Impression / Assessment and Plan / ED Course  I have reviewed the triage vital signs and the nursing notes.  Pertinent labs & imaging results that were available during my care of the patient were reviewed by me and considered in my medical decision making (see chart for details).  Patient with history of recurrent low back pain. She presents with a several day history of pain in her low back in the absence of any injury or trauma. There are no bowel or bladder complaints and there are no deficits on physical examination that would be an indication for emergent imaging or surgical intervention. She will be treated with anti-inflammatories, muscle relaxers. I've advised her to follow-up with a primary Dr. for referral to physical therapy if she is not improving in the next week.  Final Clinical Impressions(s) / ED Diagnoses   Final diagnoses:  None    New Prescriptions New Prescriptions   No medications on file  I personally performed the services described in this documentation, which was scribed in my presence. The recorded information has been reviewed and is accurate.        Veryl Speak, MD 06/27/16 [phone]

## 2016-06-26 NOTE — ED Notes (Signed)
EDP into room, prior to RN assessment, see MD notes, orders received to d/c, care assumed at time of d/c.

## 2016-06-26 NOTE — Discharge Instructions (Signed)
Naproxen as prescribed.  Flexeril as prescribed as needed for pain not relieved with naproxen.  Follow-up with your primary Dr. if you're not improving in the next week.

## 2016-06-27 NOTE — ED Notes (Signed)
Alert, NAD, calm, interactive, resps e/u, speaking in clear complete sentences, no dyspnea noted, skin W&D, c/o mid low back pain, (denies: sob, fever, nvd, bleeding, urinary or vaginal sx, loss of control of bowel or bladder, or dizziness). Family at [facility].

## 2016-07-21 ENCOUNTER — Inpatient Hospital Stay (HOSPITAL_COMMUNITY)
Admission: AD | Admit: 2016-07-21 | Discharge: 2016-07-21 | Disposition: A | Discharge status: Home / Self Care | Payer: Managed Care, Other (non HMO) | Source: Ambulatory Visit | Attending: Obstetrics & Gynecology | Admitting: Obstetrics & Gynecology

## 2016-07-21 ENCOUNTER — Encounter (HOSPITAL_COMMUNITY): Payer: Self-pay

## 2016-07-21 ENCOUNTER — Inpatient Hospital Stay (HOSPITAL_COMMUNITY): Payer: Managed Care, Other (non HMO)

## 2016-07-21 DIAGNOSIS — Z792 Long term (current) use of antibiotics: Secondary | ICD-10-CM | POA: Diagnosis not present

## 2016-07-21 DIAGNOSIS — F17210 Nicotine dependence, cigarettes, uncomplicated: Secondary | ICD-10-CM | POA: Diagnosis not present

## 2016-07-21 DIAGNOSIS — N83201 Unspecified ovarian cyst, right side: Secondary | ICD-10-CM | POA: Diagnosis not present

## 2016-07-21 DIAGNOSIS — O26891 Other specified pregnancy related conditions, first trimester: Secondary | ICD-10-CM | POA: Insufficient documentation

## 2016-07-21 DIAGNOSIS — O23591 Infection of other part of genital tract in pregnancy, first trimester: Secondary | ICD-10-CM | POA: Diagnosis not present

## 2016-07-21 DIAGNOSIS — O3481 Maternal care for other abnormalities of pelvic organs, first trimester: Secondary | ICD-10-CM | POA: Insufficient documentation

## 2016-07-21 DIAGNOSIS — B9689 Other specified bacterial agents as the cause of diseases classified elsewhere: Secondary | ICD-10-CM | POA: Insufficient documentation

## 2016-07-21 DIAGNOSIS — N760 Acute vaginitis: Secondary | ICD-10-CM | POA: Diagnosis not present

## 2016-07-21 DIAGNOSIS — N83202 Unspecified ovarian cyst, left side: Secondary | ICD-10-CM | POA: Insufficient documentation

## 2016-07-21 DIAGNOSIS — R109 Unspecified abdominal pain: Secondary | ICD-10-CM | POA: Insufficient documentation

## 2016-07-21 DIAGNOSIS — O99331 Smoking (tobacco) complicating pregnancy, first trimester: Secondary | ICD-10-CM | POA: Diagnosis not present

## 2016-07-21 DIAGNOSIS — O2341 Unspecified infection of urinary tract in pregnancy, first trimester: Secondary | ICD-10-CM | POA: Diagnosis present

## 2016-07-21 DIAGNOSIS — M549 Dorsalgia, unspecified: Secondary | ICD-10-CM | POA: Insufficient documentation

## 2016-07-21 DIAGNOSIS — O09891 Supervision of other high risk pregnancies, first trimester: Secondary | ICD-10-CM | POA: Diagnosis not present

## 2016-07-21 DIAGNOSIS — R102 Pelvic and perineal pain: Secondary | ICD-10-CM

## 2016-07-21 DIAGNOSIS — Z3A01 Less than 8 weeks gestation of pregnancy: Secondary | ICD-10-CM | POA: Diagnosis not present

## 2016-07-21 LAB — CBC
HEMATOCRIT: 33 % — AB (ref 36.0–46.0)
HEMOGLOBIN: 11.1 g/dL — AB (ref 12.0–15.0)
MCH: 28.5 pg (ref 26.0–34.0)
MCHC: 33.6 g/dL (ref 30.0–36.0)
MCV: 84.8 fL (ref 78.0–100.0)
Platelets: 244 10*3/uL (ref 150–400)
RBC: 3.89 MIL/uL (ref 3.87–5.11)
RDW: 15.6 % — ABNORMAL HIGH (ref 11.5–15.5)
WBC: 9.5 10*3/uL (ref 4.0–10.5)

## 2016-07-21 LAB — URINALYSIS, ROUTINE W REFLEX MICROSCOPIC
Bacteria, UA: NONE SEEN
Bilirubin Urine: NEGATIVE
GLUCOSE, UA: NEGATIVE mg/dL
HGB URINE DIPSTICK: NEGATIVE
Ketones, ur: NEGATIVE mg/dL
NITRITE: NEGATIVE
PH: 5 (ref 5.0–8.0)
PROTEIN: NEGATIVE mg/dL
Specific Gravity, Urine: 1.026 (ref 1.005–1.030)

## 2016-07-21 LAB — WET PREP, GENITAL
SPERM: NONE SEEN
Trich, Wet Prep: NONE SEEN
YEAST WET PREP: NONE SEEN

## 2016-07-21 LAB — HCG, QUANTITATIVE, PREGNANCY: hCG, Beta Chain, Quant, S: 8164 m[IU]/mL — ABNORMAL HIGH (ref <5)

## 2016-07-21 LAB — POCT PREGNANCY, URINE: PREG TEST UR: POSITIVE — AB

## 2016-07-21 MED ORDER — METRONIDAZOLE 500 MG PO TABS: 500.0000 mg | ORAL_TABLET | Freq: Two times a day (BID) | ORAL | 0 refills | Status: AC

## 2016-07-21 NOTE — Discharge Instructions (Signed)
Abdominal Pain During Pregnancy Belly (abdominal) pain is common during pregnancy. Most of the time, it is not a serious problem. Other times, it can be a sign that something is wrong with the pregnancy. Always tell your doctor if you have belly pain. Follow these instructions at home: Monitor your belly pain for any changes. The following actions may help you feel better:  Do not have sex (intercourse) or put anything in your vagina until you feel better.  Rest until your pain stops.  Drink clear fluids if you feel sick to your stomach (nauseous). Do not eat solid food until you feel better.  Only take medicine as told by your doctor.  Keep all doctor visits as told. Get help right away if:  You are bleeding, leaking fluid, or pieces of tissue come out of your vagina.  You have more pain or cramping.  You keep throwing up (vomiting).  You have pain when you pee (urinate) or have blood in your pee.  You have a fever.  You do not feel your baby moving as much.  You feel very weak or feel like passing out.  You have trouble breathing, with or without belly pain.  You have a very bad headache and belly pain.  You have fluid leaking from your vagina and belly pain.  You keep having watery poop (diarrhea).  Your belly pain does not go away after resting, or the pain gets worse. This information is not intended to replace advice given to you by your health care provider. Make sure you discuss any questions you have with your health care provider. Document Released: 02/24/2009 Document Revised: 10/15/2015 Document Reviewed: 10/05/2012 Elsevier Interactive Patient Education  2017 [facility]  [phone]     Phone: [place]                      [facility]  @[place]     Phone: [phone]  Phone: [place]     Phone: [phone]  Phone: [phone]           [facility] @ [place]                hone: [phone]  Phone: [phone]         [facility]                               Phone: [facility]                Phone: [phone]  [place])          Phone: [phone] [facility]   Phone: [phone]

## 2016-07-21 NOTE — MAU Provider Note (Signed)
Chief Complaint: Abdominal Pain   First Provider Initiated Contact with Patient 07/21/16 2051        SUBJECTIVE HPI: Annette Greene is a 26 y.o. No obstetric history on file. at Unknown by LMP who presents to maternity admissions reporting lower abdominal cramping and no bleeding.  Is on an antibiotic for UTI.. She denies vaginal bleeding, vaginal itching/burning, urinary symptoms, h/a, dizziness, n/v, or fever/chills.   Was seen in other ER and [place] had shown small gestational sac only 5 days ago.   Abdominal Pain  This is a new problem. The current episode started in the past 7 days. The onset quality is gradual. The problem occurs intermittently. The problem has been unchanged. The pain is located in the RLQ and LLQ. The pain is mild. The quality of the pain is cramping. The abdominal pain does not radiate. Pertinent negatives include no constipation, diarrhea, fever, headaches, myalgias, nausea or vomiting. Nothing aggravates the pain. The pain is relieved by nothing. She has tried nothing for the symptoms.    RN note: Pt states she found out she was pregnant on Friday. Started having lower abdominal cramping and RLQ pain shortly after. States she has not taken anything for pain. Was told she had a UTI and has been taking an antibiotic. Pt denies vaginal bleeding. LMP: 06/07/2016    Electronically signed by Renee Harder, RN at 07/21/2016 8:23 PM     History reviewed. No pertinent past medical history. Past Surgical History:  Procedure Laterality Date  . TONSILLECTOMY     Social History   Social History  . Marital status: Single    Spouse name: N/A  . Number of children: N/A  . Years of education: N/A   Occupational History  . Not on file.   Social History Main Topics  . Smoking status: Current Every Day Smoker    Packs/day: 0.50    Types: Cigarettes  . Smokeless tobacco: Never Used  . Alcohol use Yes     Comment: occ  . Drug use: No  . Sexual activity: Yes    Birth  control/ protection: None   Other Topics Concern  . Not on file   Social History Narrative  . No narrative on file   No current facility-administered medications on file prior to encounter.    Current Outpatient Prescriptions on File Prior to Encounter  Medication Sig Dispense Refill  . cyclobenzaprine (FLEXERIL) 10 MG tablet Take 1 tablet (10 mg total) by mouth 2 (two) times daily as needed for muscle spasms. 15 tablet 0  . ibuprofen (ADVIL,MOTRIN) 800 MG tablet Take 1 tablet (800 mg total) by mouth 3 (three) times daily. 21 tablet 0  . methocarbamol (ROBAXIN) 500 MG tablet Take 1 tablet (500 mg total) by mouth 2 (two) times daily. 20 tablet 0  . naproxen (NAPROSYN) 500 MG tablet Take 1 tablet (500 mg total) by mouth 2 (two) times daily. 20 tablet 0   No Known Allergies  I have reviewed patient's Past Medical Hx, Surgical Hx, Family Hx, Social Hx, medications and allergies.   ROS:  Review of Systems  Constitutional: Negative for fever.  Gastrointestinal: Positive for abdominal pain. Negative for constipation, diarrhea, nausea and vomiting.  Musculoskeletal: Negative for myalgias.  Neurological: Negative for headaches.   Review of Systems  Other systems negative   Physical Exam  Physical Exam Patient Vitals for the past 24 hrs:  BP Temp Temp src Pulse Resp SpO2 Height Weight  07/21/16 2023 116/66 98.7  F (37.1 C) Oral 68 18 100 % 5\' 7"  (1.702 m) 223 lb (101.2 kg)   Constitutional: Well-developed, well-nourished female in no acute distress.  Cardiovascular: normal rate Respiratory: normal effort GI: Abd soft, non-tender. Pos BS x 4 MS: Extremities nontender, no edema, normal ROM Neurologic: Alert and oriented x 4.  GU: Neg CVAT.  PELVIC EXAM: Cervix pink, visually closed, without lesion, scant white creamy discharge, vaginal walls and external genitalia normal Bimanual exam: Cervix 0/long/high, firm, anterior, neg CMT, uterus nontender, nonenlarged, adnexa without  tenderness, enlargement, or mass   LAB RESULTS Results for orders placed or performed during the hospital encounter of 07/21/16 (from the past 24 hour(s))  Urinalysis, Routine w reflex microscopic     Status: Abnormal   Collection Time: 07/21/16  8:18 PM  Result Value Ref Range   Color, Urine AMBER (A) YELLOW   APPearance HAZY (A) CLEAR   Specific Gravity, Urine 1.026 1.005 - 1.030   pH 5.0 5.0 - 8.0   Glucose, UA NEGATIVE NEGATIVE mg/dL   Hgb urine dipstick NEGATIVE NEGATIVE   Bilirubin Urine NEGATIVE NEGATIVE   Ketones, ur NEGATIVE NEGATIVE mg/dL   Protein, ur NEGATIVE NEGATIVE mg/dL   Nitrite NEGATIVE NEGATIVE   Leukocytes, UA TRACE (A) NEGATIVE   RBC / HPF 0-5 0 - 5 RBC/hpf   WBC, UA 6-30 0 - 5 WBC/hpf   Bacteria, UA NONE SEEN NONE SEEN   Squamous Epithelial / LPF 0-5 (A) NONE SEEN   Mucous PRESENT   CBC     Status: Abnormal   Collection Time: 07/21/16  8:36 PM  Result Value Ref Range   WBC 9.5 4.0 - 10.5 K/uL   RBC 3.89 3.87 - 5.11 MIL/uL   Hemoglobin 11.1 (L) 12.0 - 15.0 g/dL   HCT 33.0 (L) 36.0 - 46.0 %   MCV 84.8 78.0 - 100.0 fL   MCH 28.5 26.0 - 34.0 pg   MCHC 33.6 30.0 - 36.0 g/dL   RDW 15.6 (H) 11.5 - 15.5 %   Platelets 244 150 - 400 K/uL  hCG, quantitative, pregnancy     Status: Abnormal   Collection Time: 07/21/16  8:36 PM  Result Value Ref Range   hCG, Beta Chain, Quant, S 8,164 (H) <5 mIU/mL  Wet prep, genital     Status: Abnormal   Collection Time: 07/21/16  9:00 PM  Result Value Ref Range   Yeast Wet Prep HPF POC NONE SEEN NONE SEEN   Trich, Wet Prep NONE SEEN NONE SEEN   Clue Cells Wet Prep HPF POC PRESENT (A) NONE SEEN   WBC, Wet Prep HPF POC MODERATE (A) NONE SEEN   Sperm NONE SEEN   Pregnancy, urine POC     Status: Abnormal   Collection Time: 07/21/16  9:02 PM  Result Value Ref Range   Preg Test, Ur POSITIVE (A) NEGATIVE   Last HCG was 1900 (reflects appropriate rise)    IMAGING US Ob Comp Less 14 Wks  Result Date: 07/21/2016 CLINICAL  DATA:  Cramping and right lower quadrant pain for 6 days EXAM: OBSTETRIC <14 WK [place] AND TRANSVAGINAL OB US TECHNIQUE: Both transabdominal and transvaginal ultrasound examinations were performed for complete evaluation of the gestation as well as the maternal uterus, adnexal regions, and pelvic cul-de-sac. Transvaginal technique was performed to assess early pregnancy. COMPARISON:  None. FINDINGS: Intrauterine gestational sac: Single intrauterine gestational sac visualized Yolk sac:  Visualized Embryo:  Not visualized MSD: 10  mm   5 w  5  d Subchorionic hemorrhage:  None visualized. Maternal uterus/adnexae: The left ovary measures 2.9 x 2.2 by 2.3 cm. The right ovary measures 5.2 x 3.9 by 5.4 cm. Heterogenous cystic mass in the right ovary measuring 4.2 x 3.3 x 3.8 cm containing internal echogenic focus measuring 3 x 2.5 cm suspected to represent a hemorrhagic cyst. Echogenic mass in the right ovary measuring 2 x 1.7 by 1.8 cm. Echogenic mass in the left ovary measuring 1.6 x 0.9 x 1.2 cm. Small free fluid in the pelvis an adjacent to the ovaries. IMPRESSION: 1. Single intrauterine gestational sac with yolk sac visualized. A fetal pole is not yet identified. 2. Both ovaries contain echogenic masses ; on the right there is an additional heterogenous cystic mass measuring 4.2 cm; findings may relate to hemorrhagic follicles/cysts. Suggest 6-12 week sonographic follow-up. 3. Small amount of free fluid in the pelvis an adjacent to both ovaries Electronically Signed   By: Donavan Foil M.D.   On: 07/21/2016 21:51   US Ob Transvaginal  Result Date: 07/21/2016 CLINICAL DATA:  Cramping and right lower quadrant pain for 6 days EXAM: OBSTETRIC <14 WK [place] AND TRANSVAGINAL OB US TECHNIQUE: Both transabdominal and transvaginal ultrasound examinations were performed for complete evaluation of the gestation as well as the maternal uterus, adnexal regions, and pelvic cul-de-sac. Transvaginal technique was performed to assess  early pregnancy. COMPARISON:  None. FINDINGS: Intrauterine gestational sac: Single intrauterine gestational sac visualized Yolk sac:  Visualized Embryo:  Not visualized MSD: 10  mm   5 w   5  d Subchorionic hemorrhage:  None visualized. Maternal uterus/adnexae: The left ovary measures 2.9 x 2.2 by 2.3 cm. The right ovary measures 5.2 x 3.9 by 5.4 cm. Heterogenous cystic mass in the right ovary measuring 4.2 x 3.3 x 3.8 cm containing internal echogenic focus measuring 3 x 2.5 cm suspected to represent a hemorrhagic cyst. Echogenic mass in the right ovary measuring 2 x 1.7 by 1.8 cm. Echogenic mass in the left ovary measuring 1.6 x 0.9 x 1.2 cm. Small free fluid in the pelvis an adjacent to the ovaries. IMPRESSION: 1. Single intrauterine gestational sac with yolk sac visualized. A fetal pole is not yet identified. 2. Both ovaries contain echogenic masses ; on the right there is an additional heterogenous cystic mass measuring 4.2 cm; findings may relate to hemorrhagic follicles/cysts. Suggest 6-12 week sonographic follow-up. 3. Small amount of free fluid in the pelvis an adjacent to both ovaries Electronically Signed   By: Donavan Foil M.D.   On: 07/21/2016 21:51     MAU Management/MDM: Ordered usual first trimester r/o ectopic labs.   Pelvic exam and cultures done Will check baseline Ultrasound to rule out ectopic.  This bleeding/pain can represent a normal pregnancy with bleeding, spontaneous abortion or even an ectopic which can be life-threatening.  The process as listed above helps to determine which of these is present.  [place] now shows yolk sac in gestatonal sac which effectively rules out ectopic Does have right ovarian cysts, need to be followed in 6 weeks New dx BV, will treat  ASSESSMENT 1. Pelvic pain affecting pregnancy in first trimester, antepartum   2. Pelvic pain affecting pregnancy in first trimester, antepartum   3.     Intrauterine sac with yolk sac 4.     Recent UTI, treated 5.       Pelvic and back pain 6.     Bacterial vaginosis  PLAN Discharge home Continue UTI antibiotic Rx  Flagyl given for BV Encouraged to start prenatal care  Pt stable at time of discharge. Encouraged to return here or to other Urgent Care/ED if she develops worsening of symptoms, increase in pain, fever, or other concerning symptoms.    Hansel Feinstein CNM, MSN Certified Nurse-Midwife 07/21/2016  9:09 PM

## 2016-07-21 NOTE — MAU Note (Signed)
Pt states she found out she was pregnant on Friday. Started having lower abdominal cramping and RLQ pain shortly after. States she has not taken anything for pain. Was told she had a UTI and has been taking an antibiotic. Pt denies vaginal bleeding. LMP: 06/07/2016

## 2016-07-22 LAB — HIV ANTIBODY (ROUTINE TESTING W REFLEX): HIV Screen 4th Generation wRfx: NONREACTIVE

## 2016-07-23 LAB — GC/CHLAMYDIA PROBE AMP (~~LOC~~) NOT AT ARMC
Chlamydia: NEGATIVE
NEISSERIA GONORRHEA: NEGATIVE

## 2016-08-16 ENCOUNTER — Encounter (HOSPITAL_COMMUNITY): Payer: Self-pay | Admitting: *Deleted

## 2016-08-16 ENCOUNTER — Inpatient Hospital Stay (HOSPITAL_COMMUNITY)
Admission: AD | Admit: 2016-08-16 | Discharge: 2016-08-17 | Disposition: A | Discharge status: Home / Self Care | Payer: Managed Care, Other (non HMO) | Source: Ambulatory Visit | Attending: Obstetrics and Gynecology | Admitting: Obstetrics and Gynecology

## 2016-08-16 DIAGNOSIS — O99331 Smoking (tobacco) complicating pregnancy, first trimester: Secondary | ICD-10-CM | POA: Insufficient documentation

## 2016-08-16 DIAGNOSIS — O26891 Other specified pregnancy related conditions, first trimester: Secondary | ICD-10-CM | POA: Insufficient documentation

## 2016-08-16 DIAGNOSIS — R102 Pelvic and perineal pain: Secondary | ICD-10-CM | POA: Diagnosis not present

## 2016-08-16 DIAGNOSIS — Z3A10 10 weeks gestation of pregnancy: Secondary | ICD-10-CM | POA: Diagnosis not present

## 2016-08-16 LAB — URINALYSIS, ROUTINE W REFLEX MICROSCOPIC
BILIRUBIN URINE: NEGATIVE
Glucose, UA: NEGATIVE mg/dL
Hgb urine dipstick: NEGATIVE
KETONES UR: NEGATIVE mg/dL
Leukocytes, UA: NEGATIVE
NITRITE: NEGATIVE
Protein, ur: NEGATIVE mg/dL
SPECIFIC GRAVITY, URINE: 1.025 (ref 1.005–1.030)
pH: 6 (ref 5.0–8.0)

## 2016-08-16 NOTE — MAU Note (Signed)
Pt c/o lower back spasms and lower abdominal cramping that started last night. Rates 8/10. Has not taken anything for pain. Pt denies vag bleeding or discharge.

## 2016-08-16 NOTE — MAU Provider Note (Signed)
History     CSN: [id number]  Arrival date and time: 08/16/16 2305   First Provider Initiated Contact with Patient 08/16/16 2357      Chief Complaint  Patient presents with  . Pelvic Pain   Pelvic Pain  The patient's primary symptoms include pelvic pain. This is a new problem. The current episode started yesterday. The problem occurs constantly. The problem has been gradually worsening. Pain severity now: 8/10. The problem affects both sides. She is pregnant. Associated symptoms include back pain, nausea and vomiting. Pertinent negatives include no chills, dysuria, fever, frequency or urgency. The vaginal discharge was normal. There has been no bleeding. Nothing aggravates the symptoms. She has tried nothing for the symptoms. Sexual activity: No intercourse in the last 24 hours.  Menstrual history: LMP 06/07/16     Past Medical History:  Diagnosis Date  . Medical history non-contributory     Past Surgical History:  Procedure Laterality Date  . TONSILLECTOMY      Family History  Problem Relation Age of Onset  . Healthy Mother   . Healthy Father   . Healthy Sister   . Healthy Maternal Grandmother   . Healthy Paternal Grandmother   . Healthy Paternal Grandfather   . Healthy Sister     Social History  Substance Use Topics  . Smoking status: Current Every Day Smoker    Packs/day: 0.50    Types: Cigarettes  . Smokeless tobacco: Never Used  . Alcohol use Yes     Comment: occ    Allergies: No Known Allergies  Prescriptions Prior to Admission  Medication Sig Dispense Refill Last Dose  . cyclobenzaprine (FLEXERIL) 10 MG tablet Take 1 tablet (10 mg total) by mouth 2 (two) times daily as needed for muscle spasms. 15 tablet 0     Review of Systems  Constitutional: Negative for chills and fever.  Gastrointestinal: Positive for nausea and vomiting.  Genitourinary: Positive for pelvic pain. Negative for dysuria, frequency and urgency.  Musculoskeletal: Positive for back  pain.   Physical Exam   Blood pressure (!) 112/49, pulse (!) 105, temperature 98.9 F (37.2 C), temperature source Oral, resp. rate 18, height 5' 7.5" (1.715 m), weight 239 lb (108.4 kg), last menstrual period 06/07/2016, SpO2 100 %.  Physical Exam  Nursing note and vitals reviewed. Constitutional: She is oriented to person, place, and time. She appears well-developed and well-nourished. No distress.  HENT:  Head: Normocephalic.  Cardiovascular: Normal rate.   Respiratory: Effort normal.  GI: Soft. There is no tenderness. There is no rebound.  Neurological: She is alert and oriented to person, place, and time.  Skin: Skin is warm and dry.  Psychiatric: She has a normal mood and affect.     Results for orders placed or performed during the hospital encounter of 08/16/16 (from the past 24 hour(s))  Urinalysis, Routine w reflex microscopic     Status: Abnormal   Collection Time: 08/16/16 11:40 PM  Result Value Ref Range   Color, Urine AMBER (A) YELLOW   APPearance HAZY (A) CLEAR   Specific Gravity, Urine 1.025 1.005 - 1.030   pH 6.0 5.0 - 8.0   Glucose, UA NEGATIVE NEGATIVE mg/dL   Hgb urine dipstick NEGATIVE NEGATIVE   Bilirubin Urine NEGATIVE NEGATIVE   Ketones, ur NEGATIVE NEGATIVE mg/dL   Protein, ur NEGATIVE NEGATIVE mg/dL   Nitrite NEGATIVE NEGATIVE   Leukocytes, UA NEGATIVE NEGATIVE   Bedside US: +cardiac activity  MAU Course  Procedures  MDM   Assessment  and Plan   1. Pelvic pain in pregnancy, antepartum, first trimester    DC home Comfort measures reviewed  1st Trimester precautions  RX: none  Return to MAU as needed   Follow-up Information    Christophe Louis, MD Follow up.   Specialty:  Obstetrics and Gynecology Contact information: [address] [place] [address] [phone]            Marcille Buffy 08/16/2016,

## 2016-08-17 DIAGNOSIS — R102 Pelvic and perineal pain: Secondary | ICD-10-CM | POA: Diagnosis not present

## 2016-08-17 DIAGNOSIS — O26891 Other specified pregnancy related conditions, first trimester: Secondary | ICD-10-CM | POA: Diagnosis not present

## 2016-08-17 DIAGNOSIS — N83209 Unspecified ovarian cyst, unspecified side: Secondary | ICD-10-CM | POA: Insufficient documentation

## 2016-08-17 NOTE — Discharge Instructions (Signed)
First Trimester of Pregnancy The first trimester of pregnancy is from week 1 until the end of week 13 (months 1 through 3). A week after a sperm fertilizes an egg, the egg will implant on the wall of the uterus. This embryo will begin to develop into a baby. Genes from you and your partner will form the baby. The female genes will determine whether the baby will be a boy or a girl. At 6-8 weeks, the eyes and face will be formed, and the heartbeat can be seen on ultrasound. At the end of 12 weeks, all the baby's organs will be formed. Now that you are pregnant, you will want to do everything you can to have a healthy baby. Two of the most important things are to get good prenatal care and to follow your health care provider's instructions. Prenatal care is all the medical care you receive before the baby's birth. This care will help prevent, find, and treat any problems during the pregnancy and childbirth. Body changes during your first trimester Your body goes through many changes during pregnancy. The changes vary from woman to woman.  You may gain or lose a couple of pounds at first.  You may feel sick to your stomach (nauseous) and you may throw up (vomit). If the vomiting is uncontrollable, call your health care provider.  You may tire easily.  You may develop headaches that can be relieved by medicines. All medicines should be approved by your health care provider.  You may urinate more often. Painful urination may mean you have a bladder infection.  You may develop heartburn as a result of your pregnancy.  You may develop constipation because certain hormones are causing the muscles that push stool through your intestines to slow down.  You may develop hemorrhoids or swollen veins (varicose veins).  Your breasts may begin to grow larger and become tender. Your nipples may stick out more, and the tissue that surrounds them (areola) may become darker.  Your gums may bleed and may be  sensitive to brushing and flossing.  Dark spots or blotches (chloasma, mask of pregnancy) may develop on your face. This will likely fade after the baby is born.  Your menstrual periods will stop.  You may have a loss of appetite.  You may develop cravings for certain kinds of food.  You may have changes in your emotions from day to day, such as being excited to be pregnant or being concerned that something may go wrong with the pregnancy and baby.  You may have more vivid and strange dreams.  You may have changes in your hair. These can include thickening of your hair, rapid growth, and changes in texture. Some women also have hair loss during or after pregnancy, or hair that feels dry or thin. Your hair will most likely return to normal after your baby is born.  What to expect at prenatal visits During a routine prenatal visit:  You will be weighed to make sure you and the baby are growing normally.  Your blood pressure will be taken.  Your abdomen will be measured to track your baby's growth.  The fetal heartbeat will be listened to between weeks 10 and 14 of your pregnancy.  Test results from any previous visits will be discussed.  Your health care provider may ask you:  How you are feeling.  If you are feeling the baby move.  If you have had any abnormal symptoms, such as leaking fluid, bleeding, severe headaches,   or abdominal cramping.  If you are using any tobacco products, including cigarettes, chewing tobacco, and electronic cigarettes.  If you have any questions.  Other tests that may be performed during your first trimester include:  Blood tests to find your blood type and to check for the presence of any previous infections. The tests will also be used to check for low iron levels (anemia) and protein on red blood cells (Rh antibodies). Depending on your risk factors, or if you previously had diabetes during pregnancy, you may have tests to check for high blood  sugar that affects pregnant women (gestational diabetes).  Urine tests to check for infections, diabetes, or protein in the urine.  An ultrasound to confirm the proper growth and development of the baby.  Fetal screens for spinal cord problems (spina bifida) and Down syndrome.  HIV (human immunodeficiency virus) testing. Routine prenatal testing includes screening for HIV, unless you choose not to have this test.  You may need other tests to make sure you and the baby are doing well.  Follow these instructions at home: Medicines  Follow your health care provider's instructions regarding medicine use. Specific medicines may be either safe or unsafe to take during pregnancy.  Take a prenatal vitamin that contains at least 600 micrograms (mcg) of folic acid.  If you develop constipation, try taking a stool softener if your health care provider approves. Eating and drinking  Eat a balanced diet that includes fresh fruits and vegetables, whole grains, good sources of protein such as meat, eggs, or tofu, and low-fat dairy. Your health care provider will help you determine the amount of weight gain that is right for you.  Avoid raw meat and uncooked cheese. These carry germs that can cause birth defects in the baby.  Eating four or five small meals rather than three large meals a day may help relieve nausea and vomiting. If you start to feel nauseous, eating a few soda crackers can be helpful. Drinking liquids between meals, instead of during meals, also seems to help ease nausea and vomiting.  Limit foods that are high in fat and processed sugars, such as fried and sweet foods.  To prevent constipation: ? Eat foods that are high in fiber, such as fresh fruits and vegetables, whole grains, and beans. ? Drink enough fluid to keep your urine clear or pale yellow. Activity  Exercise only as directed by your health care provider. Most women can continue their usual exercise routine during  pregnancy. Try to exercise for 30 minutes at least 5 days a week. Exercising will help you: ? Control your weight. ? Stay in shape. ? Be prepared for labor and delivery.  Experiencing pain or cramping in the lower abdomen or lower back is a good sign that you should stop exercising. Check with your health care provider before continuing with normal exercises.  Try to avoid standing for long periods of time. Move your legs often if you must stand in one place for a long time.  Avoid heavy lifting.  Wear low-heeled shoes and practice good posture.  You may continue to have sex unless your health care provider tells you not to. Relieving pain and discomfort  Wear a good support bra to relieve breast tenderness.  Take warm sitz baths to soothe any pain or discomfort caused by hemorrhoids. Use hemorrhoid cream if your health care provider approves.  Rest with your legs elevated if you have leg cramps or low back pain.  If you develop   varicose veins in your legs, wear support hose. Elevate your feet for 15 minutes, 3-4 times a day. Limit salt in your diet. Prenatal care  Schedule your prenatal visits by the twelfth week of pregnancy. They are usually scheduled monthly at first, then more often in the last 2 months before delivery.  Write down your questions. Take them to your prenatal visits.  Keep all your prenatal visits as told by your health care provider. This is important. Safety  Wear your seat belt at all times when driving.  Make a list of emergency phone numbers, including numbers for family, friends, the hospital, and police and fire departments. General instructions  Ask your health care provider for a referral to a local prenatal education class. Begin classes no later than the beginning of month 6 of your pregnancy.  Ask for help if you have counseling or nutritional needs during pregnancy. Your health care provider can offer advice or refer you to specialists for help  with various needs.  Do not use hot tubs, steam rooms, or saunas.  Do not douche or use tampons or scented sanitary pads.  Do not cross your legs for long periods of time.  Avoid cat litter boxes and soil used by cats. These carry germs that can cause birth defects in the baby and possibly loss of the fetus by miscarriage or stillbirth.  Avoid all smoking, herbs, alcohol, and medicines not prescribed by your health care provider. Chemicals in these products affect the formation and growth of the baby.  Do not use any products that contain nicotine or tobacco, such as cigarettes and e-cigarettes. If you need help quitting, ask your health care provider. You may receive counseling support and other resources to help you quit.  Schedule a dentist appointment. At home, brush your teeth with a soft toothbrush and be gentle when you floss. Contact a health care provider if:  You have dizziness.  You have mild pelvic cramps, pelvic pressure, or nagging pain in the abdominal area.  You have persistent nausea, vomiting, or diarrhea.  You have a bad smelling vaginal discharge.  You have pain when you urinate.  You notice increased swelling in your face, hands, legs, or ankles.  You are exposed to fifth disease or chickenpox.  You are exposed to German measles (rubella) and have never had it. Get help right away if:  You have a fever.  You are leaking fluid from your vagina.  You have spotting or bleeding from your vagina.  You have severe abdominal cramping or pain.  You have rapid weight gain or loss.  You vomit blood or material that looks like coffee grounds.  You develop a severe headache.  You have shortness of breath.  You have any kind of trauma, such as from a fall or a car accident. Summary  The first trimester of pregnancy is from week 1 until the end of week 13 (months 1 through 3).  Your body goes through many changes during pregnancy. The changes vary from  woman to woman.  You will have routine prenatal visits. During those visits, your health care provider will examine you, discuss any test results you may have, and talk with you about how you are feeling. This information is not intended to replace advice given to you by your health care provider. Make sure you discuss any questions you have with your health care provider. Document Released: 03/02/2001 Document Revised: 02/18/2016 Document Reviewed: 02/18/2016 Elsevier Interactive Patient Education  2017 [employer]  

## 2016-08-26 LAB — AMB REFERRAL TO OB-GYN: Pap: NEGATIVE

## 2016-10-06 DIAGNOSIS — A599 Trichomoniasis, unspecified: Secondary | ICD-10-CM

## 2016-10-06 HISTORY — DX: Trichomoniasis, unspecified: A59.9

## 2016-11-04 ENCOUNTER — Inpatient Hospital Stay (HOSPITAL_COMMUNITY)
Admission: AD | Admit: 2016-11-04 | Discharge: 2016-11-04 | Disposition: A | Discharge status: Home / Self Care | Payer: Medicaid Other | Source: Ambulatory Visit | Attending: Obstetrics and Gynecology | Admitting: Obstetrics and Gynecology

## 2016-11-04 ENCOUNTER — Encounter (HOSPITAL_COMMUNITY): Payer: Self-pay | Admitting: *Deleted

## 2016-11-04 DIAGNOSIS — O26892 Other specified pregnancy related conditions, second trimester: Secondary | ICD-10-CM

## 2016-11-04 DIAGNOSIS — Z3A21 21 weeks gestation of pregnancy: Secondary | ICD-10-CM | POA: Insufficient documentation

## 2016-11-04 DIAGNOSIS — O99332 Smoking (tobacco) complicating pregnancy, second trimester: Secondary | ICD-10-CM | POA: Insufficient documentation

## 2016-11-04 DIAGNOSIS — R1030 Lower abdominal pain, unspecified: Secondary | ICD-10-CM | POA: Insufficient documentation

## 2016-11-04 DIAGNOSIS — F17210 Nicotine dependence, cigarettes, uncomplicated: Secondary | ICD-10-CM | POA: Diagnosis not present

## 2016-11-04 DIAGNOSIS — R109 Unspecified abdominal pain: Secondary | ICD-10-CM

## 2016-11-04 LAB — URINALYSIS, ROUTINE W REFLEX MICROSCOPIC
Bilirubin Urine: NEGATIVE
Glucose, UA: NEGATIVE mg/dL
Hgb urine dipstick: NEGATIVE
Ketones, ur: NEGATIVE mg/dL
Nitrite: NEGATIVE
PROTEIN: NEGATIVE mg/dL
SPECIFIC GRAVITY, URINE: 1.028 (ref 1.005–1.030)
pH: 5 (ref 5.0–8.0)

## 2016-11-04 LAB — WET PREP, GENITAL
Clue Cells Wet Prep HPF POC: NONE SEEN
SPERM: NONE SEEN
TRICH WET PREP: NONE SEEN
YEAST WET PREP: NONE SEEN

## 2016-11-04 MED ORDER — ACETAMINOPHEN 500 MG PO TABS
1000.0000 mg | ORAL_TABLET | Freq: Once | ORAL | Status: AC
  Administered 2016-11-04: 1000 mg via ORAL
  Filled 2016-11-04: qty 2

## 2016-11-04 MED ORDER — VALACYCLOVIR HCL 1 G PO TABS: 1000.0000 mg | ORAL_TABLET | Freq: Every day | ORAL | 0 refills | Status: DC

## 2016-11-04 MED ORDER — VALACYCLOVIR HCL 1 G PO TABS: 1000.0000 mg | ORAL_TABLET | Freq: Two times a day (BID) | ORAL | 0 refills | Status: DC

## 2016-11-04 MED ORDER — DOCUSATE SODIUM 100 MG PO CAPS: 100.0000 mg | ORAL_CAPSULE | Freq: Two times a day (BID) | ORAL | 2 refills | Status: AC

## 2016-11-04 MED ORDER — VALACYCLOVIR HCL 500 MG PO TABS: 500.0000 mg | ORAL_TABLET | Freq: Every day | ORAL | 5 refills | Status: DC

## 2016-11-04 NOTE — MAU Provider Note (Signed)
History    Annette Greene is a 26y.o. G1P0 at 21.3wks who presents, after phone call, for lower left side abdominal pain.  Patient states pain started last night and has been intermittent throughout the day.  Describes pain as sharp and denies OTC treatment.  Patient endorses constipation, but did have a bowel movement this morning albeit difficult to pass.  Patient denies fever, vomiting, and reports some mild nausea but states this is related to hunger.  Patient denies VB and change in discharge.  No intercourse in last 30 yesterday.   Patient Active Problem List   Diagnosis Date Noted  . Chest pain 02/01/2016    Chief Complaint  Patient presents with  . Abdominal Pain   HPI  OB History    Gravida Para Term Preterm AB Living   1             SAB TAB Ectopic Multiple Live Births                  Past Medical History:  Diagnosis Date  . Medical history non-contributory     Past Surgical History:  Procedure Laterality Date  . TONSILLECTOMY    . WISDOM TOOTH EXTRACTION     all 4 teeth    Family History  Problem Relation Age of Onset  . Healthy Mother   . Healthy Father   . Healthy Sister   . Healthy Maternal Grandmother   . Healthy Paternal Grandmother   . Healthy Paternal Grandfather   . Healthy Sister     Social History  Substance Use Topics  . Smoking status: Current Every Day Smoker    Packs/day: 0.50    Types: Cigarettes  . Smokeless tobacco: Never Used     Comment: stopped since pregnant  . Alcohol use Yes     Comment: stopped with pregnancy    Allergies: No Known Allergies  Prescriptions Prior to Admission  Medication Sig Dispense Refill Last Dose  . cyclobenzaprine (FLEXERIL) 10 MG tablet Take 1 tablet (10 mg total) by mouth 2 (two) times daily as needed for muscle spasms. 15 tablet 0     ROS  See HPI Above Physical Exam   Blood pressure (!) 125/55, pulse 70, temperature 98.3 F (36.8 C), temperature source Oral, resp. rate 18, weight 110.7 kg  (244 lb), last menstrual period 06/07/2016, SpO2 100 %.  Results for orders placed or performed during the hospital encounter of 11/04/16 (from the past 24 hour(s))  Urinalysis, Routine w reflex microscopic     Status: Abnormal   Collection Time: 11/04/16  6:32 PM  Result Value Ref Range   Color, Urine YELLOW YELLOW   APPearance HAZY (A) CLEAR   Specific Gravity, Urine 1.028 1.005 - 1.030   pH 5.0 5.0 - 8.0   Glucose, UA NEGATIVE NEGATIVE mg/dL   Hgb urine dipstick NEGATIVE NEGATIVE   Bilirubin Urine NEGATIVE NEGATIVE   Ketones, ur NEGATIVE NEGATIVE mg/dL   Protein, ur NEGATIVE NEGATIVE mg/dL   Nitrite NEGATIVE NEGATIVE   Leukocytes, UA TRACE (A) NEGATIVE   RBC / HPF 0-5 0 - 5 RBC/hpf   WBC, UA 6-30 0 - 5 WBC/hpf   Bacteria, UA RARE (A) NONE SEEN   Squamous Epithelial / LPF 0-5 (A) NONE SEEN   Mucous PRESENT   Wet prep, genital     Status: Abnormal   Collection Time: 11/04/16  8:39 PM  Result Value Ref Range   Yeast Wet Prep HPF POC NONE SEEN NONE  SEEN   Trich, Wet Prep NONE SEEN NONE SEEN   Clue Cells Wet Prep HPF POC NONE SEEN NONE SEEN   WBC, Wet Prep HPF POC FEW (A) NONE SEEN   Sperm NONE SEEN     Physical Exam  Constitutional: She is oriented to person, place, and time. She appears well-developed and well-nourished. No distress.  HENT:  Head: Normocephalic and atraumatic.  Eyes: Conjunctivae are normal.  Neck: Normal range of motion.  Cardiovascular: Normal rate and regular rhythm.   Respiratory: Effort normal.  GI: Soft.  Genitourinary: Uterus is enlarged. Cervix exhibits discharge. Cervix exhibits no motion tenderness. No bleeding in the vagina. Vaginal discharge found.  Genitourinary Comments: Speculum Exam: -Vaginal Vault: Moderate amt thick mucoid discharge -wet prep collected -Cervix: Appears inflamed with pustule type bumps questionable for HSV outbreak.  Thick mucoid discharge from os -Bimanual Exam: Closed/Long/Thick/Ballotable  Musculoskeletal: Normal  range of motion. She exhibits no edema.  Neurological: She is alert and oriented to person, place, and time.  Skin: Skin is warm and dry.    FHR: 154 bpm ED Course  Assessment: IUP at 21.3wks Abdominal Pain  Plan: -PE as above -Wet prep and UA -Tylenol XR for pain -Discussed restarting valtrex for suspected HSV on cervix. -Educated on bowel management including fiber rich foods and medications.  Follow Up (0917) -Wet prep negative -Rx for Valtrex 500mg  Disp 30 RF 5-Patient instructed to take daily for suppressive therapy after taking Valtrex 1000mg  daily x 5 days. -Rx for colace 100mg  BID as appropriate -Educated on safety of tylenol usage in pregnancy for pain. -Discussed round ligament pain and expectations throughout pregnancy -Keep appt as scheduled: 11/25/2016 -Discharged to home in improved condition  [place], MSN 11/04/2016 8:25 PM

## 2016-11-04 NOTE — MAU Note (Signed)
Having sharp pain on rt side, shooting down into rt side.  Has been cramping. Feels like she needs to use the bathroom, feeling pressure.  Office told her to come in because of the pressure.  Has been constipated. Did have a small BM this morning.

## 2016-11-04 NOTE — Discharge Instructions (Signed)

## 2016-11-04 NOTE — MAU Note (Signed)
Pt presents with sharp pain on the right side of the abdomen that shoots down to the vagina.  Pt states having pelvic pressure.  Pt states she's constipated, last BM 11/04/16, described BM as "hard pebbles".  States straining to have BM.  Denies bleeding.

## 2016-11-24 ENCOUNTER — Inpatient Hospital Stay (HOSPITAL_COMMUNITY)
Admission: AD | Admit: 2016-11-24 | Discharge: 2016-12-10 | DRG: 774 | Disposition: A | Discharge status: Home / Self Care | Payer: Managed Care, Other (non HMO) | Source: Ambulatory Visit | Attending: Obstetrics and Gynecology | Admitting: Obstetrics and Gynecology

## 2016-11-24 ENCOUNTER — Encounter (HOSPITAL_COMMUNITY): Payer: Self-pay

## 2016-11-24 ENCOUNTER — Inpatient Hospital Stay (HOSPITAL_COMMUNITY)
Admit: 2016-11-24 | Discharge: 2016-11-24 | Disposition: A | Discharge status: Home / Self Care | Payer: Managed Care, Other (non HMO) | Attending: Obstetrics and Gynecology | Admitting: Obstetrics and Gynecology

## 2016-11-24 ENCOUNTER — Inpatient Hospital Stay (HOSPITAL_COMMUNITY): Payer: Managed Care, Other (non HMO)

## 2016-11-24 DIAGNOSIS — O99214 Obesity complicating childbirth: Secondary | ICD-10-CM | POA: Diagnosis present

## 2016-11-24 DIAGNOSIS — A6000 Herpesviral infection of urogenital system, unspecified: Secondary | ICD-10-CM | POA: Diagnosis present

## 2016-11-24 DIAGNOSIS — Z87891 Personal history of nicotine dependence: Secondary | ICD-10-CM | POA: Diagnosis not present

## 2016-11-24 DIAGNOSIS — O3432 Maternal care for cervical incompetence, second trimester: Secondary | ICD-10-CM | POA: Diagnosis present

## 2016-11-24 DIAGNOSIS — Z3689 Encounter for other specified antenatal screening: Secondary | ICD-10-CM

## 2016-11-24 DIAGNOSIS — E669 Obesity, unspecified: Secondary | ICD-10-CM | POA: Diagnosis present

## 2016-11-24 DIAGNOSIS — O26872 Cervical shortening, second trimester: Secondary | ICD-10-CM

## 2016-11-24 DIAGNOSIS — Z6834 Body mass index (BMI) 34.0-34.9, adult: Secondary | ICD-10-CM

## 2016-11-24 DIAGNOSIS — O3430 Maternal care for cervical incompetence, unspecified trimester: Secondary | ICD-10-CM

## 2016-11-24 DIAGNOSIS — O3482 Maternal care for other abnormalities of pelvic organs, second trimester: Secondary | ICD-10-CM | POA: Diagnosis present

## 2016-11-24 DIAGNOSIS — Z3A25 25 weeks gestation of pregnancy: Secondary | ICD-10-CM

## 2016-11-24 DIAGNOSIS — O26859 Spotting complicating pregnancy, unspecified trimester: Secondary | ICD-10-CM

## 2016-11-24 DIAGNOSIS — O9832 Other infections with a predominantly sexual mode of transmission complicating childbirth: Secondary | ICD-10-CM | POA: Diagnosis present

## 2016-11-24 DIAGNOSIS — D271 Benign neoplasm of left ovary: Secondary | ICD-10-CM | POA: Diagnosis present

## 2016-11-24 DIAGNOSIS — Z3A24 24 weeks gestation of pregnancy: Secondary | ICD-10-CM

## 2016-11-24 DIAGNOSIS — D270 Benign neoplasm of right ovary: Secondary | ICD-10-CM | POA: Diagnosis present

## 2016-11-24 DIAGNOSIS — O368130 Decreased fetal movements, third trimester, not applicable or unspecified: Secondary | ICD-10-CM | POA: Diagnosis present

## 2016-11-24 DIAGNOSIS — O99212 Obesity complicating pregnancy, second trimester: Secondary | ICD-10-CM

## 2016-11-24 HISTORY — DX: Maternal care for cervical incompetence, unspecified trimester: O34.30

## 2016-11-24 LAB — CBC
HCT: 32.2 % — ABNORMAL LOW (ref 36.0–46.0)
HEMOGLOBIN: 10.9 g/dL — AB (ref 12.0–15.0)
MCH: 29.1 pg (ref 26.0–34.0)
MCHC: 33.9 g/dL (ref 30.0–36.0)
MCV: 85.9 fL (ref 78.0–100.0)
Platelets: 220 10*3/uL (ref 150–400)
RBC: 3.75 MIL/uL — AB (ref 3.87–5.11)
RDW: 13.6 % (ref 11.5–15.5)
WBC: 9.3 10*3/uL (ref 4.0–10.5)

## 2016-11-24 LAB — TYPE AND SCREEN
ABO/RH(D): O POS
ANTIBODY SCREEN: NEGATIVE

## 2016-11-24 LAB — OB RESULTS CONSOLE GBS: STREP GROUP B AG: NEGATIVE

## 2016-11-24 MED ORDER — CALCIUM CARBONATE ANTACID 500 MG PO CHEW: 2.0000 | CHEWABLE_TABLET | ORAL | Status: DC | PRN

## 2016-11-24 MED ORDER — SODIUM CHLORIDE 0.9% FLUSH
3.0000 mL | Freq: Two times a day (BID) | INTRAVENOUS | Status: DC
  Administered 2016-11-24 – 2016-12-08 (×11): 3 mL via INTRAVENOUS

## 2016-11-24 MED ORDER — ACETAMINOPHEN 325 MG PO TABS
650.0000 mg | ORAL_TABLET | ORAL | Status: DC | PRN
  Administered 2016-11-25 – 2016-12-06 (×12): 650 mg via ORAL
  Filled 2016-11-24 (×14): qty 2

## 2016-11-24 MED ORDER — SODIUM CHLORIDE 0.9 % IV SOLN: 250.0000 mL | INTRAVENOUS | Status: DC | PRN

## 2016-11-24 MED ORDER — METRONIDAZOLE 500 MG PO TABS
250.0000 mg | ORAL_TABLET | Freq: Three times a day (TID) | ORAL | Status: AC
  Administered 2016-11-24 – 2016-12-01 (×21): 250 mg via ORAL
  Filled 2016-11-24 (×4): qty 0.5
  Filled 2016-11-24 (×2): qty 1
  Filled 2016-11-24: qty 0.5
  Filled 2016-11-24 (×2): qty 1
  Filled 2016-11-24: qty 0.5
  Filled 2016-11-24 (×2): qty 1
  Filled 2016-11-24: qty 0.5
  Filled 2016-11-24: qty 1
  Filled 2016-11-24: qty 0.5
  Filled 2016-11-24: qty 1
  Filled 2016-11-24: qty 0.5
  Filled 2016-11-24: qty 1
  Filled 2016-11-24 (×3): qty 0.5
  Filled 2016-11-24: qty 1
  Filled 2016-11-24: qty 0.5
  Filled 2016-11-24 (×3): qty 1
  Filled 2016-11-24 (×3): qty 0.5
  Filled 2016-11-24: qty 1
  Filled 2016-11-24 (×3): qty 0.5
  Filled 2016-11-24: qty 1

## 2016-11-24 MED ORDER — PROGESTERONE MICRONIZED 200 MG PO CAPS
200.0000 mg | ORAL_CAPSULE | Freq: Every day | ORAL | Status: DC
  Administered 2016-11-24 – 2016-12-07 (×14): 200 mg via VAGINAL
  Filled 2016-11-24 (×16): qty 1

## 2016-11-24 MED ORDER — IBUPROFEN 600 MG PO TABS
600.0000 mg | ORAL_TABLET | Freq: Four times a day (QID) | ORAL | Status: AC
  Administered 2016-11-24 – 2016-11-27 (×12): 600 mg via ORAL
  Filled 2016-11-24 (×13): qty 1

## 2016-11-24 MED ORDER — SODIUM CHLORIDE 0.9% FLUSH
3.0000 mL | INTRAVENOUS | Status: DC | PRN
  Administered 2016-11-25: 3 mL via INTRAVENOUS
  Filled 2016-11-24: qty 3

## 2016-11-24 MED ORDER — PRENATAL MULTIVITAMIN CH
1.0000 | ORAL_TABLET | Freq: Every day | ORAL | Status: DC
  Administered 2016-11-24 – 2016-12-10 (×16): 1 via ORAL
  Filled 2016-11-24 (×16): qty 1

## 2016-11-24 MED ORDER — PANTOPRAZOLE SODIUM 40 MG IV SOLR
40.0000 mg | INTRAVENOUS | Status: DC
  Administered 2016-11-24: 40 mg via INTRAVENOUS
  Filled 2016-11-24 (×2): qty 40

## 2016-11-24 MED ORDER — DOCUSATE SODIUM 100 MG PO CAPS
100.0000 mg | ORAL_CAPSULE | Freq: Every day | ORAL | Status: DC
  Administered 2016-11-24 – 2016-12-07 (×13): 100 mg via ORAL
  Filled 2016-11-24 (×14): qty 1

## 2016-11-24 MED ORDER — BETAMETHASONE SOD PHOS & ACET 6 (3-3) MG/ML IJ SUSP
12.0000 mg | INTRAMUSCULAR | Status: AC
  Administered 2016-11-24 – 2016-11-25 (×2): 12 mg via INTRAMUSCULAR
  Filled 2016-11-24 (×2): qty 2

## 2016-11-24 MED ORDER — VALACYCLOVIR HCL 500 MG PO TABS
500.0000 mg | ORAL_TABLET | Freq: Every day | ORAL | Status: DC
  Administered 2016-11-24 – 2016-12-07 (×14): 500 mg via ORAL
  Filled 2016-11-24 (×16): qty 1

## 2016-11-24 MED ORDER — ONDANSETRON 8 MG PO TBDP
8.0000 mg | ORAL_TABLET | Freq: Three times a day (TID) | ORAL | Status: DC | PRN
  Filled 2016-11-24: qty 1

## 2016-11-24 MED ORDER — ZOLPIDEM TARTRATE 5 MG PO TABS: 5.0000 mg | ORAL_TABLET | Freq: Every evening | ORAL | Status: DC | PRN

## 2016-11-24 MED ORDER — NIFEDIPINE 10 MG PO CAPS
10.0000 mg | ORAL_CAPSULE | Freq: Four times a day (QID) | ORAL | Status: AC
  Administered 2016-11-24 – 2016-11-27 (×12): 10 mg via ORAL
  Filled 2016-11-24 (×12): qty 1

## 2016-11-24 NOTE — MAU Note (Signed)
Urine in lab 

## 2016-11-24 NOTE — H&P (Signed)
Annette Greene is a 26 y.o. female presenting for reduced fetal movement and vaginal bleeding. Seen by MAU provider with reassuring FHR for 24 weeks but upon speculum exam, bulging membranes noted.  Pregnancy followed at [facility] since 10  weeks and remarkable for:  1. Trichomonas infection 10/06/16 at at 17+ weeks treated 2. Recent genital herpes infection treated 10/25/16 and still currently on suppressive therapy 3. Cervical length 5 cm in 1st trimester and 4 cm at 20 weeks 4. Obesity with BMI 36 5. Small bilateral ovarian cysts, likely dermoid, <2 cm          OB History    Gravida Para Term Preterm AB Living   1             SAB TAB Ectopic Multiple Live Births                     Past Medical History:  Diagnosis Date  . Medical history non-contributory         Past Surgical History:  Procedure Laterality Date  . TONSILLECTOMY    . WISDOM TOOTH EXTRACTION     all 4 teeth    Family History:   family history includes Healthy in her father, maternal grandmother, mother, paternal grandfather, paternal grandmother, sister, and sister. Social History:    reports that she quit smoking about 5 months ago. Her smoking use included Cigarettes. She smoked 0.50 packs per day. She has never used smokeless tobacco. She reports that she drinks alcohol. She reports that she does not use drugs.   Prenatal labs: ABO, Rh:  O+ Antibody:  negative Rubella: immune RPR:   NR HBsAg:   NR HIV:  negative  GBS:   pending   Prenatal Transfer Tool  Maternal Diabetes: normal early glucola Genetic Screening: Normal 1st trimester screen Maternal Ultrasounds/Referrals: Normal except for bilateral small ovarian dermoid cysts < 2cm Fetal Ultrasounds or other Referrals:  None Maternal Substance Abuse:  No Significant Maternal Medications:  None Significant Maternal Lab Results: None   Blood pressure 128/62, pulse 70, temperature 98.1 F (36.7 C), temperature source Oral,  resp. rate 18, last menstrual period 06/07/2016, SpO2 100 %.  General Appearance: Alert, appropriate appearance for age. No acute distress HEENT Exam: Grossly normal Chest/Respiratory Exam: Normal chest wall and respirations. Clear to auscultation  Cardiovascular Exam: Regular rate and rhythm. S1, S2, no murmur Gastrointestinal Exam: soft, non-tender, Uterus gravid with size compatible with GA, Vertex presentation by Leopold's maneuvers Psychiatric Exam: Alert and oriented, appropriate affect  ++++++++++++++++++++++++++++++++++++++++++++++++++++++++++++++++  Vaginal exam: deferred  Bedside ultrasound preliminary report: no measurable cervix, approximately 2-3 cm dilated, membranes at the os not protruding, vertex and AGA  Fetal tracings: reassuring for 24+ weeks  ++++++++++++++++++++++++++++++++++++++++++++++++++++++++++++++++   Assessment/Plan:  G1 at 24+2 weeks with cervical incompetence Admit to antenatal: bedrest, bedside commode, BMZ series, vaginal progesterone MFM and NICU consultation DVT prophylaxis Plan of care reviewed with patient and grandmother. Questions answered. Patient appropriately worried

## 2016-11-24 NOTE — MAU Note (Deleted)
Annette Greene is a 26 y.o. female presenting for reduced fetal movement and vaginal bleeding. Seen by MAU provider with reassuring FHR for 24 weeks but upon speculum exam, bulging membranes noted.  Pregnancy followed at [facility] since 10  weeks and remarkable for:  1. Trichomonas infection 10/06/16 at at 17+ weeks treated 2. Recent genital herpes infection treated 10/25/16 and still currently on suppressive therapy 3. Cervical length 5 cm in 1st trimester and 4 cm at 20 weeks 4. Obesity with BMI 36 5. Small bilateral ovarian cysts, likely dermoid, <2 cm  OB History    Gravida Para Term Preterm AB Living   1             SAB TAB Ectopic Multiple Live Births                 Past Medical History:  Diagnosis Date  . Medical history non-contributory    Past Surgical History:  Procedure Laterality Date  . TONSILLECTOMY    . WISDOM TOOTH EXTRACTION     all 4 teeth    Family History:   family history includes Healthy in her father, maternal grandmother, mother, paternal grandfather, paternal grandmother, sister, and sister. Social History:    reports that she quit smoking about 5 months ago. Her smoking use included Cigarettes. She smoked 0.50 packs per day. She has never used smokeless tobacco. She reports that she drinks alcohol. She reports that she does not use drugs.   Prenatal labs: ABO, Rh:  O+ Antibody:  negative Rubella: immune RPR:   NR HBsAg:   NR HIV:  negative  GBS:   pending   Prenatal Transfer Tool  Maternal Diabetes: normal early glucola Genetic Screening: Normal 1st trimester screen Maternal Ultrasounds/Referrals: Normal except for bilateral small ovarian dermoid cysts < 2cm Fetal Ultrasounds or other Referrals:  None Maternal Substance Abuse:  No Significant Maternal Medications:  None Significant Maternal Lab Results: None     Blood pressure 128/62, pulse 70, temperature 98.1 F (36.7 C), temperature source Oral, resp. rate 18, last menstrual period  06/07/2016, SpO2 100 %.  General Appearance: Alert, appropriate appearance for age. No acute distress HEENT Exam: Grossly normal Chest/Respiratory Exam: Normal chest wall and respirations. Clear to auscultation  Cardiovascular Exam: Regular rate and rhythm. S1, S2, no murmur Gastrointestinal Exam: soft, non-tender, Uterus gravid with size compatible with GA, Vertex presentation by Leopold's maneuvers Psychiatric Exam: Alert and oriented, appropriate affect  ++++++++++++++++++++++++++++++++++++++++++++++++++++++++++++++++  Vaginal exam: deferred  Bedside ultrasound preliminary report: no measurable cervix, approximately 2-3 cm dilated, membranes at the os not protruding, vertex and AGA  Fetal tracings: reassuring for 24+ weeks  ++++++++++++++++++++++++++++++++++++++++++++++++++++++++++++++++   Assessment/Plan:  G1 at 24+2 weeks with cervical incompetence Admit to antenatal: bedrest, bedside commode, BMZ series, vaginal progesterone MFM and NICU consultation DVT prophylaxis Plan of care reviewed with patient and grandmother. Questions answered. Patient appropriately worried   Delsa Bern MD 11/24/2016, 1:17 PM

## 2016-11-24 NOTE — MAU Provider Note (Signed)
History     CSN: [id number]  Arrival date and time: 11/24/16 1008   First Provider Initiated Contact with Patient 11/24/16 1035      Chief Complaint  Patient presents with  . Vaginal Bleeding  . Decreased Fetal Movement   HPI  Annette Greene is a 26 yo G1P0 at 24.[redacted] wks gestation presenting to MAU with complaints of decreased FM even after eating and drinking today and some spotting since last night.   Past Medical History:  Diagnosis Date  . Medical history non-contributory     Past Surgical History:  Procedure Laterality Date  . TONSILLECTOMY    . WISDOM TOOTH EXTRACTION     all 4 teeth    Family History  Problem Relation Age of Onset  . Healthy Mother   . Healthy Father   . Healthy Sister   . Healthy Maternal Grandmother   . Healthy Paternal Grandmother   . Healthy Paternal Grandfather   . Healthy Sister     Social History  Substance Use Topics  . Smoking status: Former Smoker    Packs/day: 0.50    Types: Cigarettes    Quit date: 06/24/2016  . Smokeless tobacco: Never Used     Comment: stopped since pregnant  . Alcohol use Yes     Comment: stopped with pregnancy    Allergies: No Known Allergies  Prescriptions Prior to Admission  Medication Sig Dispense Refill Last Dose  . docusate sodium (COLACE) 100 MG capsule Take 1 capsule (100 mg total) by mouth 2 (two) times daily. 60 capsule 2   . valACYclovir (VALTREX) 1000 MG tablet Take 1 tablet (1,000 mg total) by mouth daily. 5 tablet 0   . valACYclovir (VALTREX) 500 MG tablet Take 1 tablet (500 mg total) by mouth daily. 30 tablet 5     Review of Systems  Constitutional: Negative.   HENT: Negative.   Eyes: Negative.   Respiratory: Negative.   Cardiovascular: Negative.   Gastrointestinal: Positive for nausea and vomiting.  Endocrine: Negative.   Genitourinary: Positive for vaginal bleeding.       Decreased FM today  Musculoskeletal: Negative.   Skin: Negative.   Allergic/Immunologic: Negative.    Neurological: Negative.   Hematological: Negative.   Psychiatric/Behavioral: Negative.    Physical Exam   Temperature 98.1 F (36.7 C), resp. rate 18, last menstrual period 06/07/2016.  Physical Exam  Constitutional: She is oriented to person, place, and time. She appears well-developed and well-nourished.  HENT:  Head: Normocephalic.  Eyes: Pupils are equal, round, and reactive to light.  Neck: Normal range of motion.  Cardiovascular: Normal rate, regular rhythm and normal heart sounds.   Respiratory: Effort normal and breath sounds normal.  GI: Soft. Bowel sounds are normal.  Genitourinary:  Genitourinary Comments: Speculum: Bulging membranes into the vagina, no bleeding visualized  Musculoskeletal: Normal range of motion.  Neurological: She is alert and oriented to person, place, and time. She has normal reflexes.  Skin: Skin is warm and dry.  Psychiatric: She has a normal mood and affect. Her behavior is normal. Judgment and thought content normal.    MAU Course  Procedures  MDM NST - FHR: 155 bpm / moderate variability / accels present / decels absent TOCO: none  *Consult with Dr. Cletis Media @ 1130 - notified of patient's complaints, BBOW into vagina with speculum exam, & bedside U/S ordered - orders received to call MD when U/S is at bedside  Care assumed by Dr. Cletis Media @ [address]  Renato Battles, MSN, [place] 11/24/2016, 10:42 AM   Assessment and Plan

## 2016-11-24 NOTE — MAU Note (Signed)
Annette Greene is a 26 y.o. female presenting for reduced fetal movement and vaginal bleeding. Seen by MAU provider with reassuring FHR for 24 weeks but upon speculum exam, bulging membranes noted.  Pregnancy followed at [facility] since 10  weeks and remarkable for:  1. Trichomonas infection 10/06/16 at at 17+ weeks treated 2. Recent genital herpes infection treated 10/25/16 and still currently on suppressive therapy 3. Cervical length 5 cm in 1st trimester and 4 cm at 20 weeks 4. Obesity with BMI 36 5. Small bilateral ovarian cysts, likely dermoid, <2 cm  OB History    Gravida Para Term Preterm AB Living   1             SAB TAB Ectopic Multiple Live Births                 Past Medical History:  Diagnosis Date  . Medical history non-contributory    Past Surgical History:  Procedure Laterality Date  . TONSILLECTOMY    . WISDOM TOOTH EXTRACTION     all 4 teeth    Family History:   family history includes Healthy in her father, maternal grandmother, mother, paternal grandfather, paternal grandmother, sister, and sister. Social History:    reports that she quit smoking about 5 months ago. Her smoking use included Cigarettes. She smoked 0.50 packs per day. She has never used smokeless tobacco. She reports that she drinks alcohol. She reports that she does not use drugs.   Prenatal labs: ABO, Rh:  O+ Antibody:  negative Rubella: immune RPR:   NR HBsAg:   NR HIV:  negative  GBS:   pending   Prenatal Transfer Tool  Maternal Diabetes: normal early glucola Genetic Screening: Normal 1st trimester screen Maternal Ultrasounds/Referrals: Normal except for bilateral small ovarian dermoid cysts < 2cm Fetal Ultrasounds or other Referrals:  None Maternal Substance Abuse:  No Significant Maternal Medications:  None Significant Maternal Lab Results: None     Blood pressure 128/62, pulse 70, temperature 98.1 F (36.7 C), temperature source Oral, resp. rate 18, last menstrual period  06/07/2016, SpO2 100 %.  General Appearance: Alert, appropriate appearance for age. No acute distress HEENT Exam: Grossly normal Chest/Respiratory Exam: Normal chest wall and respirations. Clear to auscultation  Cardiovascular Exam: Regular rate and rhythm. S1, S2, no murmur Gastrointestinal Exam: soft, non-tender, Uterus gravid with size compatible with GA, Vertex presentation by Leopold's maneuvers Psychiatric Exam: Alert and oriented, appropriate affect  ++++++++++++++++++++++++++++++++++++++++++++++++++++++++++++++++  Vaginal exam: deferred  Bedside ultrasound preliminary report: no measurable cervix, approximately 2-3 cm dilated, membranes at the os not protruding, vertex and AGA  Fetal tracings: reassuring for 24+ weeks  ++++++++++++++++++++++++++++++++++++++++++++++++++++++++++++++++   Assessment/Plan:  G1 at 24+2 weeks with cervical incompetence Admit to antenatal: bedrest, bedside commode, BMZ series, vaginal progesterone MFM and NICU consultation DVT prophylaxis Plan of care reviewed with patient and grandmother. Questions answered. Patient appropriately worried   Delsa Bern MD 11/24/2016, 12:34 PM

## 2016-11-24 NOTE — MAU Note (Signed)
Pt noticed some spotting last night and has continued today. She is not feeling baby move today even after eating and drinking.

## 2016-11-24 NOTE — Progress Notes (Signed)
This patient's history and medical record has been reviewed and I read her [place] today. She is beyond the gestational age where we would recommend cerclage. At this time the only interventions we can offer are intravaginal progesterone, either 200mg  micronized progesterone qHS or 1 applicatorful of 8% crinone per vaginal qHS daily. She may benefit from nifedipine 10mg  q6h as well as ibuprofen 600mg  q6h x 12 doses. She should remain hospitalized for now. I would hold off on neuroprotective MgSO4 until she is threatening delivery

## 2016-11-25 LAB — ABO/RH: ABO/RH(D): O POS

## 2016-11-25 MED ORDER — PANTOPRAZOLE SODIUM 40 MG PO TBEC
40.0000 mg | DELAYED_RELEASE_TABLET | Freq: Every day | ORAL | Status: DC
  Administered 2016-11-25 – 2016-12-07 (×13): 40 mg via ORAL
  Filled 2016-11-25 (×13): qty 1

## 2016-11-25 NOTE — Progress Notes (Signed)
The patient is receiving Protonix by the intravenous route.  Based on criteria approved by the Pharmacy and [place], the medication is being converted to the equivalent oral dose form.  These criteria include: -No active GI bleeding -Able to tolerate diet of full liquids (or better) or tube feeding -Able to tolerate other medications by the oral or enteral route  If you have any questions about this conversion, please contact the Pharmacy Department (phone [phone]).  Thank you.  [name] 11/25/2016

## 2016-11-25 NOTE — Progress Notes (Signed)
26 y.o. year old female,at [redacted]w[redacted]d gestation.  SUBJECTIVE:  Doing OK. No bleeding.  OBJECTIVE:  BP 130/66 (BP Location: Left Arm)   Pulse 86   Temp 99.1 F (37.3 C) (Oral)   Resp 20   Ht 5\' 7"  (1.702 m)   Wt 101.2 kg (223 lb)   LMP 06/07/2016   SpO2 100%   BMI 34.93 kg/m   Fetal Heart Tones:  Stable  Contractions:          None  HEENT: WNL Chest: Clear Heart: RRR ABD: nontender EXT: WNL. Leg boots on  ASSESSMENT:  [redacted]w[redacted]d Weeks Pregnancy  Preterm Cervical Change  No labor  PLAN:  Continue hospital care  NICU consult pending  Gildardo Cranker, M.D. 11/25/2016

## 2016-11-25 NOTE — Plan of Care (Signed)
Problem: Bowel/Gastric: Goal: Will not experience complications related to bowel motility Outcome: Progressing Had BM today. Recommend use of BSC to aid with elimination.   Problem: Coping: Goal: Level of anxiety will decrease Outcome: Progressing Keep pt informed of status and expected time frame for events. Educate on rationale for trendelenburg positioning and continued bedrest.

## 2016-11-26 LAB — CULTURE, BETA STREP (GROUP B ONLY)

## 2016-11-26 MED ORDER — CYCLOBENZAPRINE HCL 10 MG PO TABS
10.0000 mg | ORAL_TABLET | Freq: Three times a day (TID) | ORAL | Status: DC
  Administered 2016-11-26 – 2016-12-08 (×36): 10 mg via ORAL
  Filled 2016-11-26 (×43): qty 1

## 2016-11-26 MED ORDER — OXYCODONE-ACETAMINOPHEN 5-325 MG PO TABS
1.0000 | ORAL_TABLET | ORAL | Status: DC | PRN
  Administered 2016-11-26 – 2016-12-08 (×6): 1 via ORAL
  Filled 2016-11-26 (×11): qty 1

## 2016-11-26 NOTE — Progress Notes (Signed)
Windy Kalata, CNM to bedside to assess pt pain.

## 2016-11-26 NOTE — Progress Notes (Signed)
[name], CNM called to get report on pt.  Pain assessment reported. See orders.

## 2016-11-26 NOTE — Plan of Care (Signed)
Problem: Education: Goal: Knowledge of disease or condition will improve Outcome: Progressing Pt discussing risks associated with her condition. Teaching reinforced.

## 2016-11-26 NOTE — Progress Notes (Addendum)
Hospital day # 2 pregnancy at [redacted]w[redacted]d--Preterm cervical dilation, cervical incompetence  Called to see patient at 0645 due to back pain and cramping.  Reports cramping "is the same as it has always been", but having mid to lower back pain through night.  In hands and knees position at present, but talking on the phone to family.  Denies leaking, bleeding, dysuria.  Pain is improved with more upright position.     O: BP (!) 122/40 (BP Location: Right Arm)   Pulse 74   Temp 98.9 F (37.2 C)   Resp 18   Ht 5\' 7"  (1.702 m)   Wt 101.2 kg (223 lb)   LMP 06/07/2016   SpO2 100%   BMI 34.93 kg/m       Fetal tracings:  FHR 150-160, very occasional mild variables      Contractions:   None palpated, none noted on monitor.      Uterus non-tender      Extremities: no significant edema and no signs of DVT         Back--negative CVAT, no point tenderness in any area.          Meds:   Procardia 10 mg po q 6 hours (received at 6am)  Ibuprophen 600 mg po q 6 hours (received at 6am)  Valtrex 500 mg po daily  Colace 100 mg po daily  Metronidazole 250 mg po q 8 hours x 7 days   Completed betamethasone course 9/4 and 9/5.     A: [redacted]w[redacted]d with preterm cervical dilation     Back pain, likely musculoskeletal  P: Continue current plan of care      Upcoming tests/treatments:  Percocet now.      Will consult with Dr. Charlesetta Garibaldi       MDs will follow  Donnel Saxon CNM, [place] 11/26/2016 0700  Addendum: Patient feeling better now, reports back pain down to 6 (from 10), but continuing to talk on the phone without difficulty. Consulted with Dr. Charlesetta Garibaldi. Flexeril x 24 hours, then prn. RN will work with positioning and other comfort measures. Continue to observe for any change in obstetrical status.  Davis Gourd 11/26/16 0900  Pt seen and examined.  She is feeling better with the meds.  Monitor closely

## 2016-11-27 NOTE — Plan of Care (Signed)
Problem: Pain Managment: Goal: General experience of comfort will improve Outcome: Progressing Medications provided per orders to minimize discomfort while on bedrest.  Problem: Bowel/Gastric: Goal: Will not experience complications related to bowel motility Outcome: Progressing Pt maintaining a regular diet and encouraged to continue with adequate po intake to prevent constipation due to prolonged bedrest.   Problem: Education: Goal: Knowledge of disease or condition will improve Outcome: Progressing Pt aware of risk factors that may cause preterm delivery. Teaching on rationale for bedrest, medications, and monitoring discussed and reinforced.

## 2016-11-27 NOTE — Progress Notes (Signed)
Antepartum LOS: 75 Annette Greene, 26 y.o.,   OB History    Gravida Para Term Preterm AB Living   1             SAB TAB Ectopic Multiple Live Births                  Subjective -Nurse call reports patient with concerns regarding fetal heart rate and requests provider despite trying to address concerns.  In room to assess.  Patient questions normal parameters of fetal heart rate. Denies other questions or concerns.   Objective  Vitals:   11/27/16 1513 11/27/16 1655 11/27/16 1731 11/27/16 2032  BP: (!) 112/49   126/72  Pulse: 73   75  Resp: 16   18  Temp: 98.5 F (36.9 C) 100.1 F (37.8 C) 99 F (37.2 C) 98.4 F (36.9 C)  TempSrc: Oral Oral Oral Oral  SpO2: 100%   100%  Weight:      Height:        No results found for this or any previous visit (from the past 24 hour(s)).  Meds: Scheduled Meds: . cyclobenzaprine  10 mg Oral TID  . docusate sodium  100 mg Oral Daily  . metroNIDAZOLE  250 mg Oral Q8H  . pantoprazole  40 mg Oral Daily  . prenatal multivitamin  1 tablet Oral Q1200  . progesterone  200 mg Vaginal QHS  . sodium chloride flush  3 mL Intravenous Q12H  . valACYclovir  500 mg Oral Daily   Continuous Infusions: . sodium chloride     PRN Meds:.sodium chloride, acetaminophen, calcium carbonate, ondansetron, oxyCODONE-acetaminophen, sodium chloride flush, zolpidem   Physical Exam: Deferred  Monitoring Type: NST with continuous toco Time:2049-2132 FHR: 155 bpm, Mod Var, +Variable Decels, +Accels UC: None graphed  Assessment IUP at [redacted]w[redacted]d Reassuring NST for GA   Plan Discussed normal parameters for fetal heart rate Patient states nurses have been informing her that fetal "heart rate is a little high." Reassurances given and informed that higher is tolerated more than lower fetal heart rate Educated on fetal development and inability to regulate heart rate appropriately until between 28-32weeks and older Patient reassured with education Encouraged to  contact provider if other questions arise or write down until am Continue current plan of care   Maryann Conners, MSN, CNM 11/27/2016, 10:14 PM

## 2016-11-27 NOTE — Progress Notes (Signed)
Hospital day # 3 pregnancy at [redacted]w[redacted]d  S: well, reports good fetal activity      Contractions:none      Vaginal bleeding:light spotting times 1      Vaginal discharge: no significant change  O: BP (!) 112/49 (BP Location: Left Arm)   Pulse 73   Temp 99 F (37.2 C) (Oral)   Resp 16   Ht 5\' 7"  (1.702 m)   Wt 223 lb (101.2 kg)   LMP 06/07/2016   SpO2 100%   BMI 34.93 kg/m       Fetal tracings:reviewed and reassurring      Uterus non tender      Extremities: No s/sx of DVT  A: [redacted]w[redacted]d with cervical incompetence and hourglassing membranes     unchanged  P: continue current plan of care  Annette Greene CNM 11/27/2016 6:56 PM

## 2016-11-28 NOTE — Progress Notes (Signed)
IV came out, [facility] aware. May leave out until [place] rounds on pt and assess.

## 2016-11-28 NOTE — Consult Note (Addendum)
Neonatal Medicine 11/28/2016 10:58 PM  Annette Greene [id number]  Asked by patient's OB (Dr. Cletis Media) to speak to this patient who was admitted on 11/24/16 at 24 weeks with incompetent cervix and hour-glassing membranes.  Her history also complicated by HSV infection on 10/25/16 (treated and placed on suppressive therapy), obesity, trich infection at 17 weeks (treated), decreased fetal movement and vaginal bleeding (when admitted on 9/5).  She has been monitored, given betamethasone course.  I discussed with her the possible outcomes for a baby born as early as 75 weeks (she will be 25 weeks tomorrow) including survival, respiratory distress, infections, feeding difficulties, intracranial hemorrhage, retinopathy, developmental delays and disorders.  She plans to breast feed, which I encouraged as the best way to feed her baby.  I discussed how our team will be present at the delivery of her son, and what steps we will be taking following the birth.  Although we are hopeful her baby will survive, I stressed that keeping him undelivered for several more weeks will be crucial to reduce his risk of significant morbidity.  All her questions were answered.   I spent 30 minutes reviewing the medical record and speaking to this patient.  Roosevelt Locks, MD Attending Neonatologist

## 2016-11-28 NOTE — Progress Notes (Signed)
Hospital day # 4 pregnancy at [redacted]w[redacted]d  S: well, reports good fetal activity      Contractions:none      Vaginal bleeding:none now       Vaginal discharge: no significant change  O: BP (!) 116/46 (BP Location: Right Arm)   Pulse 66   Temp 98.4 F (36.9 C) (Oral)   Resp 16   Ht 5\' 7"  (1.702 m)   Wt 223 lb (101.2 kg)   LMP 06/07/2016   SpO2 100%   BMI 34.93 kg/m       Fetal tracings:Fetal heart variability: moderate reviewed and reassuring      Uterus gravid, consistent with 24 weeks and non-tender      Extremities: extremities normal, atraumatic, no cyanosis or edema and no significant edema and no signs of DVT  A: [redacted]w[redacted]d with incompetent cervix and hourglassing membranes     stable  P: continue current plan of care     NICU consult today  Bertram Gala Clemmons  CNM 11/28/2016 11:28 AM

## 2016-11-28 NOTE — Progress Notes (Signed)
Pt requested to put off monitoring until she awakens from her nap. Have checked on pt several times since and she has been asleep.

## 2016-11-28 NOTE — Progress Notes (Signed)
Antepartum LOS: Annette Greene, 26 y.o.,   OB History    Gravida Para Term Preterm AB Living   1             SAB TAB Ectopic Multiple Live Births                  Subjective Pt without complaints.  Denies LOF except discharge.  Had 6 contractions in 24 hours.  Reports good fetal movement.  Objective Temp 10/27/16: 100.1 at 1655  Vitals:   11/27/16 2032 11/27/16 2359 11/28/16 0941 11/28/16 1205  BP: 126/72 (!) 120/57 (!) 116/46 (!) 105/52  Pulse: 75 71 66 75  Resp: 18 16 16 16   Temp: 98.4 F (36.9 C) 98.3 F (36.8 C) 98.4 F (36.9 C) 98.6 F (37 C)  TempSrc: Oral Oral Oral Oral  SpO2: 100% 100% 100% 100%  Weight:      Height:        No results found for this or any previous visit (from the past 24 hour(s)).  Meds: Scheduled Meds: . cyclobenzaprine  10 mg Oral TID  . docusate sodium  100 mg Oral Daily  . metroNIDAZOLE  250 mg Oral Q8H  . pantoprazole  40 mg Oral Daily  . prenatal multivitamin  1 tablet Oral Q1200  . progesterone  200 mg Vaginal QHS  . sodium chloride flush  3 mL Intravenous Q12H  . valACYclovir  500 mg Oral Daily   Continuous Infusions: . sodium chloride     PRN Meds:.sodium chloride, acetaminophen, calcium carbonate, ondansetron, oxyCODONE-acetaminophen, sodium chloride flush, zolpidem   Physical Exam:  Gen: NAD, A&O x3. Happy with family at bedside Abd:  No fundal tenderness Ext:  No edema or calf tenderness.  Monitoring Type: Intermittent NST with continuous toco  FHR: 150s bpm, Mod Var, +Variable Decels few , +Accels UC: None graphed  Assessment IUP at [redacted]w[redacted]d Preterm cervical dilatation.   S/p BMZ  On vaginal progesterone  On Flagyl and Procarida Reassuring fetal status. No signs of labor. Afebrile but elevated temperature (100.1) once 24 hours ago.    Plan Continue current management. Observe closely for labor, ROM.  Monitor temperature closely. D/w plan with pt.  Thurnell Lose, MSN, CNM 11/28/2016, 2:39 PM

## 2016-11-28 NOTE — Progress Notes (Signed)
Patient stated she wanted to see provider to ask questions about the baby's hr, pt was concerned that fhr was in the 160's. Suggested that we place u/s on to see what baby's hr was. Pt cont to insist to see a provider despite nurse attempting to see what concerns were. Spoke with CNM, she will come to see pt and address concerns.

## 2016-11-29 LAB — TYPE AND SCREEN
ABO/RH(D): O POS
Antibody Screen: NEGATIVE

## 2016-11-29 NOTE — Plan of Care (Signed)
Problem: Pain Managment: Goal: General experience of comfort will improve Outcome: Progressing Pt has demonstrated much improved level of comfort and less c/o pain from back with administration of Flexeril as prescribed.

## 2016-11-29 NOTE — Progress Notes (Signed)
26 y.o. year old female,at [redacted]w[redacted]d gestation. The patient was admitted on 11/24/2016 with preterm cervical change. Her membranes are visible but not bulging into the vagina. The cervix is completely effaced. The patient has received betamethasone. She was treated with Procardia and metronidazole. There is no evidence of labor.  SUBJECTIVE:  Doing well on bed rest.  OBJECTIVE:  BP (!) 101/45 (BP Location: Right Arm)   Pulse 67   Temp 98.1 F (36.7 C) (Oral)   Resp 16   Ht 5\' 7"  (1.702 m)   Wt 101.2 kg (223 lb)   LMP 06/07/2016   SpO2 100%   BMI 34.93 kg/m   Fetal Heart Tones:  Stable fetal heart rate.  Contractions:          None  Abdomen: Soft and nontender Extremities: No evidence of DVT No bleeding or leakage of fluid  ASSESSMENT:  [redacted]w[redacted]d Weeks Pregnancy  Preterm cervical change  No evidence of labor  PLAN:  Continue in-hospital management.  Continue metronidazole for total of 7 days.  Gildardo Cranker, M.D. 11/29/2016

## 2016-11-30 MED ORDER — ENOXAPARIN SODIUM 40 MG/0.4ML ~~LOC~~ SOLN
40.0000 mg | SUBCUTANEOUS | Status: DC
  Administered 2016-11-30 – 2016-12-07 (×8): 40 mg via SUBCUTANEOUS
  Filled 2016-11-30 (×9): qty 0.4

## 2016-11-30 NOTE — Progress Notes (Signed)
26 y.o. year old female,at [redacted]w[redacted]d gestation. The patient was admitted on 11/24/2016 with preterm cervical change. Her membranes are visible but not bulging into the vagina. The cervix is completely effaced. The patient has received betamethasone. She was treated with Procardia and metronidazole. There is no evidence of labor.  SUBJECTIVE:  Doing well on bed rest.  OBJECTIVE:  BP (!) 114/58 (BP Location: Left Arm)   Pulse 80   Temp 98.6 F (37 C) (Oral)   Resp 18   Ht 5\' 7"  (1.702 m)   Wt 101.2 kg (223 lb)   LMP 06/07/2016   SpO2 100%   BMI 34.93 kg/m    Fetal Heart Tones:  Stable fetal heart rate.  Contractions:          None  Abdomen: Soft and nontender Extremities: No evidence of DVT No bleeding or leakage of fluid  ASSESSMENT:  [redacted]w[redacted]d Weeks Pregnancy  Preterm cervical change  No evidence of labor  PLAN:  Continue in-hospital management.  Continue metronidazole for total of 7 days.  Gildardo Cranker, M.D. 11/30/2016

## 2016-11-30 NOTE — Progress Notes (Signed)
pt will like to be put on monitor later during the day. PT on continous toco at this time.

## 2016-11-30 NOTE — Progress Notes (Signed)
Came by to see patient.  She is doing well, in good spirits.  Reports previous back pain has resolved with Flexeril.  Reviewed plan for continuing metronidazole for full 7 day course, due to BV noted on admission. MFM recommended d/c of Procardia since betamethasone course has been completed. Completed 3 day course of Ibuprophen.  GBS negative from 11/24/16.  Patient requested I take some [employer] Accommodation paperwork to our office for completion.  Donnel Saxon, CNM 11/30/16 5:21p

## 2016-11-30 NOTE — Progress Notes (Signed)
Pt  refused to be placed  on monitor at this time will like to be placed at 6 a.m.  Last FHR Monitor at 1745.  Pt continue to be on continous toco monitoring.

## 2016-12-01 ENCOUNTER — Inpatient Hospital Stay (HOSPITAL_COMMUNITY): Payer: Managed Care, Other (non HMO)

## 2016-12-01 MED ORDER — LACTATED RINGERS IV BOLUS (SEPSIS)
500.0000 mL | Freq: Once | INTRAVENOUS | Status: AC
  Administered 2016-12-01: 500 mL via INTRAVENOUS

## 2016-12-01 MED ORDER — NIFEDIPINE 10 MG PO CAPS
10.0000 mg | ORAL_CAPSULE | Freq: Four times a day (QID) | ORAL | Status: DC | PRN
  Administered 2016-12-01 – 2016-12-07 (×8): 10 mg via ORAL
  Filled 2016-12-01 (×8): qty 1

## 2016-12-01 MED ORDER — LACTATED RINGERS IV SOLN
INTRAVENOUS | Status: DC
  Administered 2016-12-02 – 2016-12-04 (×5): via INTRAVENOUS

## 2016-12-01 NOTE — Progress Notes (Signed)
I received a referral from pt's nurse due to potential early delivery.  Pt seemed in good spirits and it seemed as if she may not be aware of the critical situation.  Her grandmother was in the room with her.  I introduced spiritual care services (and spent a few more minutes with her grandmother outside of the room to explain our role of providing spiritual and emotional support) and they were grateful for me checking in on them, but did not state any needs at this particular time.    [place], Bcc Pager, [phone] 1:40 PM    12/01/16 1300  Clinical Encounter Type  Visited With Patient and family together  Visit Type Spiritual support  Referral From Nurse

## 2016-12-01 NOTE — Progress Notes (Signed)
Dr Mancel Bale will come up after her case to check patient.  She also stated to call in house OB if delivery is immanent.

## 2016-12-01 NOTE — Progress Notes (Signed)
Patient reports feeling pressure in pubic area.  Dr Mancel Bale was contacted and orders given for digital exam and [place] for presentation.

## 2016-12-01 NOTE — Progress Notes (Addendum)
PT Cancellation Note  Patient Details Name: Annette Greene MRN: [id number] DOB: Jul 23, 1990   Cancelled Treatment:    Reason Eval/Treat Not Completed: Medical issues which prohibited therapy. Pt now with bulging membranes into vagina. Will defer PT intervention at this time. Will follow up at a later date for pt's status.   [place] 12/01/2016, 3:11 PM [place]

## 2016-12-01 NOTE — Progress Notes (Addendum)
Patient ID: Annette Greene, female   DOB: [birth year], 26 y.o.   MRN: [id number] Annette Greene is a 26 y.o. G1P0 at [redacted]w[redacted]d  Subjective: C/o pressure but not as bad as last night.  Low back pain and crampiness.  She had a bowel movement this morning.  CTSP by Jackelyn Poling her nurse earlier while I was in a c-section so I asked her to examine patient and check presentation with u/s.  Bulging membranes in vagina and cephalic on u/s.  Objective: BP 123/64 (BP Location: Left Arm)   Pulse 88   Temp 99 F (37.2 C) (Oral)   Resp 18   Ht 5\' 7"  (1.702 m)   Wt 101.2 kg (223 lb)   LMP 06/07/2016   SpO2 100%   BMI 34.93 kg/m  I/O last 3 completed shifts: In: 240 [P.O.:240] Out: 901 [Urine:900; Stool:1] No intake/output data recorded.  Physical Exam:  Gen: alert Chest/Lungs: cta bilaterally  Heart/Pulse: RRR  Abdomen: soft, gravid, nontender Uterine fundus: soft, nontender Skin & Color: warm and dry  EXT: negative Homan's b/l, edema, no calf tenderness  FHT:  FHR: 150s-160s bpm, variability: moderate, acceleration: occas small accel decelerations:  Occasional variables UC:   Not picking up well SVE:   Exam by:: Danny Lawless RN  Labs: Lab Results  Component Value Date   WBC 9.3 11/24/2016   HGB 10.9 (L) 11/24/2016   HCT 32.2 (L) 11/24/2016   MCV 85.9 11/24/2016   PLT 220 11/24/2016   GBS neg  Assessment and Plan: has Chest pain and Cervical incompetence on her problem list. Cont to observe Will not check pt again right now Precautions discussed Start procardia and IV hydration Continuous monitoring  Fateh Kindle Y 12/01/2016, 2:11 PM

## 2016-12-01 NOTE — Progress Notes (Signed)
Annette Greene [id number]  Subjective: Nurse call and reports that patient has been having variables. Strip and Chart Reviewed.  Objective:  Vitals:   12/01/16 1402 12/01/16 1544 12/01/16 1720 12/01/16 1933  BP:  113/74  115/61  Pulse:  87  90  Resp:  20  20  Temp: 99 F (37.2 C) 98.9 F (37.2 C) 98.3 F (36.8 C) 98.2 F (36.8 C)  TempSrc: Oral Oral Oral Oral  SpO2:  100%  100%  Weight:      Height:        FHR: 145 bpm, Min Var, +Variables Decels, +Accels UC: None Graphed  Assessment: IUP at [redacted]w[redacted]d Cat II FT Cervical Incompetence  Plan: -Nurse reports position change with minimal effects -Instructed to give 568mL LR bolus over 30 minutes-order placed -Improvement noted at 2336 aeb increased variability and smaller decel. -Will continue to monitor   Milinda Cave, CNM 12/01/2016 10:34 PM

## 2016-12-02 LAB — TYPE AND SCREEN
ABO/RH(D): O POS
Antibody Screen: NEGATIVE

## 2016-12-02 NOTE — Progress Notes (Signed)
Initial Nutrition Assessment  DOCUMENTATION CODES:  Obesity unspecified INTERVENTION:  Regular Diet May order double protein portions, snacks TID and from retail  NUTRITION DIAGNOSIS:  Increased nutrient needs related to  (pregnancy and fetal growth requirements) as evidenced by  (25 weeks IUP).  GOAL:  Patient will meet greater than or equal to 90% of their needs  MONITOR:  Weight trends  REASON FOR ASSESSMENT:  Antenatal   ASSESSMENT:  25 3/7 weeks, incomp cervix. Pt states pre-preg weight of 230lbs, BMI 36.1. Current weight per pt 242 Lbs, not the current recorded weight. Weight gain of 12 lbs. Good appetite  Diet Order:  Diet regular Room service appropriate? Yes; Fluid consistency: Pudding Thick  Skin:  Reviewed, no issues  Height:   Ht Readings from Last 1 Encounters:  11/24/16 5\' 7"  (1.702 m)   Weight:   Wt Readings from Last 1 Encounters:  11/24/16 223 lb (101.2 kg)   Ideal Body Weight:   135 lbs  BMI:  Body mass index is 34.93 kg/m.  Estimated Nutritional Needs:   Kcal:  2300-2500  Protein:  100-110 g  Fluid:  2.6 L  EDUCATION NEEDS: none identified     Weyman Rodney M.Fredderick Severance LDN Neonatal Nutrition Support Specialist/RD III Pager [phone]      Phone [phone]

## 2016-12-02 NOTE — Progress Notes (Addendum)
Antepartum LOS: 21 Annette Greene, 26 y.o.,   OB History    Gravida Para Term Preterm AB Living   1             SAB TAB Ectopic Multiple Live Births                  Subjective -Patient resting in bed as nurse readjusts EFM.  Patient states she is "disgusted" at necessity of fetal monitoring and has been unable to sleep throughout the night.  Patient continues to report back pain that is constant, but denies cramping, bleeding, LOF, and pressure.   Objective  Vitals:   12/01/16 1720 12/01/16 1933 12/01/16 2302 12/02/16 0800  BP:  115/61 (!) 117/58 133/61  Pulse:  90 79 79  Resp:  20 18 20   Temp: 98.3 F (36.8 C) 98.2 F (36.8 C) 98.5 F (36.9 C) 98.9 F (37.2 C)  TempSrc: Oral Oral Oral Oral  SpO2:  100% 97% 100%  Weight:      Height:        No results found for this or any previous visit (from the past 24 hour(s)).  Meds: Scheduled Meds: . cyclobenzaprine  10 mg Oral TID  . docusate sodium  100 mg Oral Daily  . enoxaparin (LOVENOX) injection  40 mg Subcutaneous Q24H  . pantoprazole  40 mg Oral Daily  . prenatal multivitamin  1 tablet Oral Q1200  . progesterone  200 mg Vaginal QHS  . sodium chloride flush  3 mL Intravenous Q12H  . valACYclovir  500 mg Oral Daily   Continuous Infusions: . sodium chloride    . lactated ringers 125 mL/hr at 12/02/16 0302   PRN Meds:.sodium chloride, acetaminophen, calcium carbonate, NIFEdipine, ondansetron, oxyCODONE-acetaminophen, sodium chloride flush, zolpidem   Physical Exam  Constitutional: She is oriented to person, place, and time. She appears well-developed and well-nourished. No distress.  HENT:  Head: Normocephalic and atraumatic.  Eyes: Conjunctivae are normal.  Neck: Normal range of motion.  Cardiovascular: Normal rate, regular rhythm and normal heart sounds.   Pulmonary/Chest: Effort normal and breath sounds normal.  Abdominal: Soft. Bowel sounds are normal.  Musculoskeletal: Normal range of motion. She exhibits no  edema.  SCD in place  Neurological: She is alert and oriented to person, place, and time.  Skin: Skin is warm and dry.  Psychiatric: She has a normal mood and affect. Her behavior is normal.  Vitals reviewed. :   Monitoring Type:Continuous Time:0620-0640 FHR: 145 bpm, Mod Var, -Decels, -Accels UC: None graphed  Assessment IUP at [redacted]w[redacted]d Cat I FT Cervical Incompetence  Plan -Discussed necessity of EFM for fetal wellbeing assessment in relation to patient c/o cramping and pressure on 9/12 -Patient verbalizes understanding -Reassurances given regarding fetal wellbeing after IV bolus -Informed patient that MD will be updated and NST will be considered -Monitor temperatures as appropriate as low grade (99) noted at 1400 on 9/12, but normal since -Continue current plan of care -Upcoming Treatments/Tests: TBA -Dr.ND given report, updated, and to follow as appropriate  Maryann Conners, MSN, CNM 12/02/2016, 7:00 AM  Pt seen and examined.  Agree with above will do intermittent monitoring

## 2016-12-02 NOTE — Progress Notes (Signed)
Patient got herself up to the bedside commode after she was told to use the bedpan only.  She was advised that this could be risky.

## 2016-12-03 NOTE — Progress Notes (Signed)
Patient called out complaining of pressure with streaks of blood when wipe. She states pressure feeling has been the same, has brown discharge with red streaks of blood. This nothing different from what she has previous experienced. Will continue to monitor

## 2016-12-03 NOTE — Progress Notes (Signed)
Antepartum LOS: 29 Annette Greene, 26 y.o.,   OB History    Gravida Para Term Preterm AB Living   1             SAB TAB Ectopic Multiple Live Births                  Subjective -Patient asleep in bed, but easily awakened by provider presence. Patient denies back pain, constipation, or issues with urination.  Patient reports fetal movement and denies cramping, pressure, vaginal bleeding, and LoF. Patient appears to be in bright mood.  Objective  Vitals:   12/02/16 1200 12/02/16 1600 12/02/16 2057 12/03/16 0039  BP: 132/73 (!) 131/51 (!) 109/47 (!) 104/48  Pulse: 77 79 83 81  Resp: 18 18 18 18   Temp: 98.6 F (37 C) 99.6 F (37.6 C) 98.6 F (37 C) 97.9 F (36.6 C)  TempSrc: Oral Oral Oral Oral  SpO2: 100% 100% 100% 99%  Weight:      Height:        Results for orders placed or performed during the hospital encounter of 11/24/16 (from the past 24 hour(s))  Type and screen [place]     Status: None   Collection Time: 12/02/16  9:32 AM  Result Value Ref Range   ABO/RH(D) O POS    Antibody Screen NEG    Sample Expiration 12/05/2016     Meds: Scheduled Meds: . cyclobenzaprine  10 mg Oral TID  . docusate sodium  100 mg Oral Daily  . enoxaparin (LOVENOX) injection  40 mg Subcutaneous Q24H  . pantoprazole  40 mg Oral Daily  . prenatal multivitamin  1 tablet Oral Q1200  . progesterone  200 mg Vaginal QHS  . sodium chloride flush  3 mL Intravenous Q12H  . valACYclovir  500 mg Oral Daily   Continuous Infusions: . sodium chloride    . lactated ringers 125 mL/hr at 12/02/16 0302   PRN Meds:.sodium chloride, acetaminophen, calcium carbonate, NIFEdipine, ondansetron, oxyCODONE-acetaminophen, sodium chloride flush, zolpidem   Physical Exam  Constitutional: She is oriented to person, place, and time. She appears well-developed and well-nourished. No distress.  HENT:  Head: Normocephalic and atraumatic.  Eyes: Conjunctivae are normal.  Neck: Normal range  of motion.  Cardiovascular: Normal rate, regular rhythm and normal heart sounds.   Pulmonary/Chest: Effort normal and breath sounds normal.  Abdominal: Soft. Bowel sounds are normal.  Musculoskeletal: Normal range of motion.  Neurological: She is alert and oriented to person, place, and time.  Skin: Skin is warm and dry.  :   Monitoring Type: NST Time:0100-0130 FHR: 150 bpm, Mod Var, -Decels, -Accels UC: None graphed  Assessment IUP at [redacted]w[redacted]d Reassuring NST Cervical Incompetence   Plan Educated on safety and necessity of complete bedrest including usage of bedpan.  Patient verbalizes understanding. Continue current plan of care Upcoming Treatments/Tests: Dr.AR given report and to follow as appropriate  Maryann Conners, MSN, CNM 12/03/2016, 6:17 AM

## 2016-12-03 NOTE — Progress Notes (Signed)
Annette Greene was in good spirits and continues to be well-supported by her family.  I affirmed the love I see her showing her baby by staying in an uncomfortable position in order to provide him the best care.    She is aware of our ongoing availability of support, but please page as needs arise.  [place], [place] Pager, [phone] 2:01 PM    12/03/16 1300  Clinical Encounter Type  Visited With Patient  Visit Type Spiritual support

## 2016-12-04 NOTE — Progress Notes (Signed)
Hospital day # 10 pregnancy at [redacted]w[redacted]d  S: well, reports good fetal activity      Contractions:none, occasional cramping with pressure      Vaginal bleeding:none now; Pt states she has had small amount of discharge /red blood when wiping after going to bathroom x 2 approximately 830am      Vaginal discharge: mucousy, bloody and no significant change  O: BP (!) 117/56 (BP Location: Right Arm)   Pulse 78   Temp 98.4 F (36.9 C) (Oral)   Resp 18   Ht 5\' 7"  (1.702 m)   Wt 223 lb (101.2 kg)   LMP 06/07/2016   SpO2 100%   BMI 34.93 kg/m       Fetal tracings:reviewed and reassuring      Uterus non-tender      Extremities: extremities normal, atraumatic, no cyanosis or edema and no significant edema and no signs of DVT  A: [redacted]w[redacted]d with incompetent cervix     stable  P: continue current plan of care Place pt on continuous Genia Plants A Clemmons  CNM 12/04/2016 9:27 AM

## 2016-12-05 LAB — TYPE AND SCREEN
ABO/RH(D): O POS
ANTIBODY SCREEN: NEGATIVE

## 2016-12-05 MED ORDER — LACTATED RINGERS IV SOLN
INTRAVENOUS | Status: DC
Start: 2016-12-05 — End: 2016-12-09
  Administered 2016-12-05: 75 mL/h via INTRAVENOUS
  Administered 2016-12-06 – 2016-12-08 (×5): via INTRAVENOUS

## 2016-12-05 NOTE — Progress Notes (Signed)
Hospital day # 11 pregnancy at [redacted]w[redacted]d--cervical incompetence.  S:  Having cramping with rectal pressure.  States had pinkish discharge with BM.  Denies leakage of fluid.       Perception of contractions: none, cramping every 30 seconds      Vaginal bleeding: none now and spotting       Vaginal discharge:  no significant change  O: BP 105/67 (BP Location: Right Arm)   Pulse 93   Temp 99.4 F (37.4 C) (Oral)   Resp 16   Ht 5\' 7"  (1.702 m)   Wt 101.2 kg (223 lb)   LMP 06/07/2016   SpO2 100%   BMI 34.93 kg/m       Fetal tracings:Cat 1      Contractions:   None on monitor or with external palpation  Uterus gravid, consistent with 25 weeks and non-tender SVE 2/80/high, small amount of BBOW at cervical opening      Extremities: extremities normal, atraumatic, no cyanosis or edema and no significant edema and no signs of DVT          Labs:  No recent       Meds: Procardia 10 mg PO given Prometrium 200mg  vaginally inserted  A: [redacted]w[redacted]d with cervical incompetence     stable  P: Continue current plan of care      Upcoming tests/treatments:  Cont toco, Procardia as prescribed as needed.      MD updated.  Starla Link CNM, MSN 12/05/2016 9:47 PM

## 2016-12-05 NOTE — Progress Notes (Signed)
Patient requested not to be disturbed by the nursing staff unless she requested our service. We informed patient that it is our professional responsibility to do hourly rounding to ensure her safety, but we will endeavor not to wake her from her sleep.

## 2016-12-05 NOTE — Progress Notes (Signed)
Hospital day # 11 pregnancy at [redacted]w[redacted]d  S: well, reports good fetal activity      Contractions:none      Vaginal bleeding:none now       Vaginal discharge: no significant change  O: BP 123/63 (BP Location: Right Arm)   Pulse 90   Temp 98.6 F (37 C) (Oral)   Resp 16   Ht 5\' 7"  (1.702 m)   Wt 223 lb (101.2 kg)   LMP 06/07/2016   SpO2 100%   BMI 34.93 kg/m       Fetal tracings:reviewed and reassuring      Uterus non-tender      Extremities: extremities normal, atraumatic, no cyanosis or edema, Homans sign is negative, no sign of DVT and no significant edema and no signs of DVT  A: [redacted]w[redacted]d with incompetent cervix      stable  P: continue current plan of care      Restart IVF at [facility] 75cc/hr  Grant 12/05/2016 12:08 PM

## 2016-12-06 MED ORDER — LACTATED RINGERS IV BOLUS (SEPSIS)
500.0000 mL | Freq: Once | INTRAVENOUS | Status: AC
  Administered 2016-12-06: 500 mL via INTRAVENOUS

## 2016-12-06 MED ORDER — OXYCODONE-ACETAMINOPHEN 5-325 MG PO TABS
2.0000 | ORAL_TABLET | Freq: Once | ORAL | Status: AC
  Administered 2016-12-06: 2 via ORAL

## 2016-12-06 NOTE — Progress Notes (Signed)
Hospital day # 12 pregnancy at [redacted]w[redacted]d with incompetent cervix  S: well, reports good fetal activity      Contractions:none, irregular, feeling pelvic pressure since last night      Vaginal bleeding:none now       Vaginal discharge: no significant change  O: BP (!) 110/49 (BP Location: Right Arm)   Pulse 85   Temp 98.3 F (36.8 C) (Oral)   Resp 18   Ht 5\' 7"  (1.702 m)   Wt 223 lb (101.2 kg)   LMP 06/07/2016   SpO2 100%   BMI 34.93 kg/m       Fetal tracings:reviewed and reassuring      Uterus gravid and non-tender      Extremities: Homans sign is negative, no sign of DVT and no significant edema and no signs of DVT  A: [redacted]w[redacted]d with incompetent cervix     stable  P: continue current plan of care      Procardio prn Bertram Gala Clemmons CNM 12/06/2016 12:07 PM

## 2016-12-06 NOTE — Progress Notes (Addendum)
Antepartum LOS: 26 Annette Greene, 26 y.o.,   OB History    Gravida Para Term Preterm AB Living   1             SAB TAB Ectopic Multiple Live Births                  Subjective -Nurse call to report patient with c/o continued contractions and refusal to take pain medications.  In room to assess.  Patient reporting "contractions every 8 minutes since 0630."  Patient appears distressed with contraction pain, but also agitated with providers inability to give definite answer of if pain will stop or continue.  Patient also reports constant rectal and pelvic pressure with intermittent cramping/contraction pain. Patient reports bowel movement this am and no issues with urination.  Patient reports no change in vaginal discharge.    Objective  Vitals:   12/06/16 0907 12/06/16 1006 12/06/16 1604 12/06/16 1957  BP: (!) 115/56 (!) 110/49 (!) 115/55 (!) 115/57  Pulse: 86 85 93 94  Resp: 18 18 17 18   Temp: 98.5 F (36.9 C) 98.3 F (36.8 C) 97.8 F (36.6 C) 99.1 F (37.3 C)  TempSrc: Oral Oral Oral Oral  SpO2: 100% 100% 100% 100%  Weight:      Height:        No results found for this or any previous visit (from the past 24 hour(s)).  Meds: Scheduled Meds: . cyclobenzaprine  10 mg Oral TID  . docusate sodium  100 mg Oral Daily  . enoxaparin (LOVENOX) injection  40 mg Subcutaneous Q24H  . pantoprazole  40 mg Oral Daily  . prenatal multivitamin  1 tablet Oral Q1200  . progesterone  200 mg Vaginal QHS  . sodium chloride flush  3 mL Intravenous Q12H  . valACYclovir  500 mg Oral Daily   Continuous Infusions: . sodium chloride    . lactated ringers 75 mL/hr at 12/06/16 1343   PRN Meds:.sodium chloride, acetaminophen, calcium carbonate, NIFEdipine, ondansetron, oxyCODONE-acetaminophen, sodium chloride flush, zolpidem   Physical Exam  Constitutional: She is oriented to person, place, and time. She appears well-developed and well-nourished. She appears distressed (with contractions).   HENT:  Head: Normocephalic and atraumatic.  Eyes: Conjunctivae are normal.  Neck: Normal range of motion.  Cardiovascular: Normal rate, regular rhythm and normal heart sounds.   Pulmonary/Chest: Effort normal and breath sounds normal.  Abdominal: Soft. Bowel sounds are normal.  Soft RT, NT, Gravid  Musculoskeletal: Normal range of motion.  SCD in place  Neurological: She is alert and oriented to person, place, and time.  Skin: Skin is warm.  Psychiatric: She has a normal mood and affect. Her behavior is normal.  :   Monitoring Type:NST/Toco Continuous Time:1737-1815 FHR: 155 bpm, Mod Var, + Variable Decels, +Accels UC: None Graphed/ Noted at 2152 and 2200 on toco  Assessment IUP at [redacted]w[redacted]d Cervical Incompetence Contractions  Plan -In depth discussion about need to treat feelings of discomfort/pain as a means of reducing contractions -Patient open to oral medication and reports good effects with percocet in past -2 Percocet now, once -Give LR bolus -Continue tocometry -Urine Culture to be sent -Discussed possibility of MgSO4 if monitor shows continuous contractions despite medication dosing -Will place on NST at 2300 and assess until 0100 -Continue current plan of care -Provider to return as appropriate  Maryann Conners, MSN, CNM 12/06/2016, 9:50 PM   Addendum  Patient reports improvement in symptoms.  155 bpm, Mod Var, +Occ Variable Decels, +  Accels No Ctx Graphed  Reassuring Tracing  -Discussed proper hydration -Okay to discontinue NST and tocometry to promote sleep throughout the night -Patient instructed to contact staff if pressure or cramping becomes bothersome. -Nurse updated on patient status and orders  Maryann Conners MSN, CNM  12/07/2016 1:29 AM

## 2016-12-06 NOTE — Progress Notes (Signed)
S: RN reports that the patient states her contractions are getting more intense. The RN reports the patients demeanor has not changed and that she has refused pain medication because she does not want it to get to the baby. Pt was vaginally checked last night and found to be 2-3/th with slight BBOW. She has been in trendelenburg position since admission.  O: Strip reviewed and reassurring for gestational age. Occasional contractions. A: Incompetent Cervix P: Repeat Procardia 10mg  PO now      Ask pt to lie on her back to be able to assess tocometer more effectively.

## 2016-12-06 NOTE — Progress Notes (Signed)
Contact with Yvonne Kendall, CNM.  Provider made aware of patient reporting that her contractions are more intense and lasting for a longer period of time. Toco has been readjusted. Instructed to give patient another dose of Procardia at this time. Provider to review EFM. Toya Smothers, RN

## 2016-12-06 NOTE — Evaluation (Signed)
Physical Therapy Evaluation Patient Details Name: Annette Greene MRN: [id number] DOB: [birth year] Today's Date: 12/06/2016   History of Present Illness  Pt adm with cervical incompetence.   Clinical Impression  Pt presents to PT on strict bedrest due to incompetent cervix. Pt instructed in ex program to perform while on bedrest. Pt verbalized understanding. Instructed pt to stop exercises if incr contractions or leakage occurs with ex's. Will follow up tomorrow with pt to answer any questions and bring the written hand out of ex's.     Follow Up Recommendations Other (comment) (to be assessed after delivery)    Equipment Recommendations  None recommended by PT    Recommendations for Other Services       Precautions / Restrictions Precautions Precautions: Other (comment) Precaution Comments: Strict bedrest      Mobility  Bed Mobility                  Transfers                    Ambulation/Gait                Stairs            Wheelchair Mobility    Modified Rankin (Stroke Patients Only)       Balance                                             Pertinent Vitals/Pain      Home Living Family/patient expects to be discharged to:: Private residence                      Prior Function Level of Independence: Independent               Hand Dominance        Extremity/Trunk Assessment   Upper Extremity Assessment Upper Extremity Assessment: Generalized weakness (due to bedrest)    Lower Extremity Assessment Lower Extremity Assessment: Generalized weakness (due to bedrest)       Communication      Cognition Arousal/Alertness: Awake/alert Behavior During Therapy: WFL for tasks assessed/performed Overall Cognitive Status: Within Functional Limits for tasks assessed                                        General Comments      Exercises General Exercises - Upper  Extremity Shoulder Flexion: AROM;5 reps;Sidelying Antenatal Exercises Ankle Circles/Pumps: 5 reps;Right;Sidelying Quad Sets: 5 reps;AROM;Sidelying Short Arc Quad: AROM;Right;5 reps;Sidelying Sidelying Hip Flexor Stretch: AAROM;Right;5 reps   Assessment/Plan    PT Assessment Patient needs continued PT services  PT Problem List Decreased strength       PT Treatment Interventions Patient/family education;Therapeutic exercise    PT Goals (Current goals can be found in the Care Plan section)       Frequency Other (Comment) (Will check on every 2-3 weeks.)   Barriers to discharge        Co-evaluation               AM-PAC PT "6 Clicks" Daily Activity  Outcome Measure Difficulty turning over in bed (including adjusting bedclothes, sheets and blankets)?: None Difficulty moving from lying on back to sitting on the side of the bed? :  Unable Difficulty sitting down on and standing up from a chair with arms (e.g., wheelchair, bedside commode, etc,.)?: Unable Help needed moving to and from a bed to chair (including a wheelchair)?: Total Help needed walking in hospital room?: Total Help needed climbing 3-5 steps with a railing? : Total 6 Click Score: 9    End of Session   Activity Tolerance: Patient tolerated treatment well Patient left: in bed;with call bell/phone within reach Nurse Communication: Other (comment) (Exercise education) PT Visit Diagnosis: Muscle weakness (generalized) (M62.81)    Time: 8127-5170 PT Time Calculation (min) (ACUTE ONLY): 12 min   Charges:   PT Evaluation $PT Eval Low Complexity: 1 Low     PT G CodesMarland Kitchen        [facility] PT [place] 12/06/2016, 4:41 PM

## 2016-12-07 ENCOUNTER — Inpatient Hospital Stay (HOSPITAL_COMMUNITY): Payer: Managed Care, Other (non HMO)

## 2016-12-07 MED ORDER — NIFEDIPINE 10 MG PO CAPS
20.0000 mg | ORAL_CAPSULE | Freq: Four times a day (QID) | ORAL | Status: DC
  Administered 2016-12-07 – 2016-12-08 (×5): 20 mg via ORAL
  Filled 2016-12-07 (×5): qty 2

## 2016-12-07 MED ORDER — MAGNESIUM SULFATE 40 G IN LACTATED RINGERS - SIMPLE
2.0000 g/h | INTRAVENOUS | Status: DC
  Filled 2016-12-07: qty 500

## 2016-12-07 MED ORDER — LACTATED RINGERS IV BOLUS (SEPSIS)
300.0000 mL | Freq: Once | INTRAVENOUS | Status: AC
  Administered 2016-12-07: 300 mL via INTRAVENOUS

## 2016-12-07 MED ORDER — TERBUTALINE SULFATE 1 MG/ML IJ SOLN
0.2500 mg | Freq: Once | INTRAMUSCULAR | Status: AC
  Administered 2016-12-07: 0.25 mg via SUBCUTANEOUS
  Filled 2016-12-07: qty 1

## 2016-12-07 MED ORDER — MAGNESIUM SULFATE BOLUS VIA INFUSION
4.0000 g | Freq: Once | INTRAVENOUS | Status: AC
  Administered 2016-12-07: 4 g via INTRAVENOUS
  Filled 2016-12-07: qty 500

## 2016-12-07 NOTE — Progress Notes (Addendum)
Pt is refusing to start the magnesium until her mother arrives around 7pm. Patient will call RN when her mother arrives.

## 2016-12-07 NOTE — Progress Notes (Addendum)
Hospital day # 13 pregnancy at [redacted]w[redacted]d--Cervical incompetence. Betamethasone 9/5 and 9/6 GBS negative Cervix 2 cm, 80%, BBOW on exam 12/05/17 [place] 12/02/16--vtx, normal fluid. [place] 11/24/16--EFW 1+7, 46%ile. Vaginal progesterone q hs.  S:  Slept at intervals s/p taking Percocet at 2204 and 0304.  Noting more cramping this am.      Perception of contractions: none, regular, every 5-6 minutes      Vaginal bleeding: Smear of bloody show noted with voiding   O: BP 123/68 (BP Location: Right Arm)   Pulse 76   Temp 98.4 F (36.9 C) (Oral)   Resp 16   Ht 5\' 7"  (1.702 m)   Wt 101.2 kg (223 lb)   LMP 06/07/2016   SpO2 99%   BMI 34.93 kg/m       Fetal tracings:  Reassuring for EGA, no decels      Contractions: q 5-6 min, mild/moderate        Uterus non-tender      Extremities: no significant edema and no signs of DVT          Labs:  Urine culture pending from 12/06/16; T&S done 12/05/16       Meds:  . cyclobenzaprine  10 mg Oral TID  . docusate sodium  100 mg Oral Daily  . enoxaparin (LOVENOX) injection  40 mg Subcutaneous Q24H  . pantoprazole  40 mg Oral Daily  . prenatal multivitamin  1 tablet Oral Q1200  . progesterone  200 mg Vaginal QHS  . sodium chloride flush  3 mL Intravenous Q12H  . valACYclovir  500 mg Oral Daily    A: [redacted]w[redacted]d with cervical incompetency     UCs this am  P: Consulted with Dr. Cletis Media      Procardia 20 mg po now and q 6 hours      IV bolus.       Continuous EFM      [place] for growth scheduled tomorrow, will have at bedside.        Donnel Saxon CNM, [place] 12/07/2016 8:13 AM

## 2016-12-07 NOTE — Progress Notes (Signed)
PT Note  Brought pt written hand out of ex's we went over yesterday. No questions from pt. Will follow pt via Epic for any changes in activity level that will warrant further intervention/follow-up.  [employer] PT [phone]

## 2016-12-07 NOTE — Progress Notes (Addendum)
Came to see patient--UCs now back to q 5-6 min, no increase in intensity. No bleeding now.  Consulted with Dr. Cletis Media. Cervix 5 cm, 80%, vtx, -1, BBOW.  Will start magnesium for neuroprotection, with infusion planned x 12 hours. NICU updated on patient status. If labor status advances, plan transfer to L&D.  Donnel Saxon, CNM 12/07/16 5:50p  Reevaluated patient after Magnesium Sulfate infusing.  Pt states that contractions have stopped.  Dr. Cletis Media given update of patients status.

## 2016-12-07 NOTE — Progress Notes (Signed)
Patient removed her fetal heart monitors and its refusing to be monitored at this time. CNM Donnel Saxon made aware and instructed to return to [facility] 52min every shift later this afternoon as long as pt remains stable in presentation.

## 2016-12-07 NOTE — Progress Notes (Addendum)
Came to see patient s/p dose of Terbutaline SQ at 1002. Not feeling contractions at present since administration. Small amount bloody mucus on tissue with wiping after voiding, no active bleeding.  Informal bedside US for presentation--appears vtx/oblique, but will get [place] staff to confirm.  Reviewed plan of care with patient and her grandmother at bedside.  If UCs return, will consider mag for neuroprophylaxis per previous consult with Dr. Cletis Media.  Support to patient for concerns.  Donnel Saxon, CNM 12/07/16 10:40a  Addendum: [place] confirms vtx presentation, adequate pocket of fluid.  Donnel Saxon, CNM 12/07/16 11a

## 2016-12-08 ENCOUNTER — Encounter (HOSPITAL_COMMUNITY): Payer: Self-pay

## 2016-12-08 LAB — CBC
HCT: 31.9 % — ABNORMAL LOW (ref 36.0–46.0)
Hemoglobin: 10.7 g/dL — ABNORMAL LOW (ref 12.0–15.0)
MCH: 28.9 pg (ref 26.0–34.0)
MCHC: 33.5 g/dL (ref 30.0–36.0)
MCV: 86.2 fL (ref 78.0–100.0)
Platelets: 231 10*3/uL (ref 150–400)
RBC: 3.7 MIL/uL — AB (ref 3.87–5.11)
RDW: 13.5 % (ref 11.5–15.5)
WBC: 10.6 10*3/uL — AB (ref 4.0–10.5)

## 2016-12-08 LAB — TYPE AND SCREEN
ABO/RH(D): O POS
Antibody Screen: NEGATIVE

## 2016-12-08 LAB — CULTURE, OB URINE

## 2016-12-08 MED ORDER — LIDOCAINE HCL (PF) 1 % IJ SOLN
INTRAMUSCULAR | Status: AC
  Filled 2016-12-08: qty 30

## 2016-12-08 MED ORDER — WITCH HAZEL-GLYCERIN EX PADS: 1.0000 "application " | MEDICATED_PAD | CUTANEOUS | Status: DC | PRN

## 2016-12-08 MED ORDER — DIBUCAINE 1 % RE OINT: 1.0000 "application " | TOPICAL_OINTMENT | RECTAL | Status: DC | PRN

## 2016-12-08 MED ORDER — ONDANSETRON HCL 4 MG/2ML IJ SOLN: 4.0000 mg | Freq: Four times a day (QID) | INTRAMUSCULAR | Status: DC | PRN

## 2016-12-08 MED ORDER — LIDOCAINE HCL (PF) 1 % IJ SOLN
30.0000 mL | INTRAMUSCULAR | Status: DC | PRN
  Filled 2016-12-08: qty 30

## 2016-12-08 MED ORDER — ACETAMINOPHEN 325 MG PO TABS: 650.0000 mg | ORAL_TABLET | ORAL | Status: DC | PRN

## 2016-12-08 MED ORDER — OXYCODONE-ACETAMINOPHEN 5-325 MG PO TABS: 2.0000 | ORAL_TABLET | ORAL | Status: DC | PRN

## 2016-12-08 MED ORDER — TETANUS-DIPHTH-ACELL PERTUSSIS 5-2.5-18.5 LF-MCG/0.5 IM SUSP: 0.5000 mL | Freq: Once | INTRAMUSCULAR | Status: DC

## 2016-12-08 MED ORDER — LACTATED RINGERS IV SOLN: INTRAVENOUS | Status: DC

## 2016-12-08 MED ORDER — IBUPROFEN 600 MG PO TABS
600.0000 mg | ORAL_TABLET | Freq: Four times a day (QID) | ORAL | Status: DC
  Administered 2016-12-08 – 2016-12-09 (×4): 600 mg via ORAL
  Filled 2016-12-08 (×5): qty 1

## 2016-12-08 MED ORDER — OXYTOCIN BOLUS FROM INFUSION
500.0000 mL | Freq: Once | INTRAVENOUS | Status: AC
  Administered 2016-12-08: 500 mL via INTRAVENOUS

## 2016-12-08 MED ORDER — OXYCODONE-ACETAMINOPHEN 5-325 MG PO TABS: 1.0000 | ORAL_TABLET | ORAL | Status: DC | PRN

## 2016-12-08 MED ORDER — LACTATED RINGERS IV SOLN: 500.0000 mL | INTRAVENOUS | Status: DC | PRN

## 2016-12-08 MED ORDER — OXYTOCIN 40 UNITS IN LACTATED RINGERS INFUSION - SIMPLE MED
INTRAVENOUS | Status: AC
  Administered 2016-12-08: 500 mL via INTRAVENOUS
  Filled 2016-12-08: qty 1000

## 2016-12-08 MED ORDER — SOD CITRATE-CITRIC ACID 500-334 MG/5ML PO SOLN: 30.0000 mL | ORAL | Status: DC | PRN

## 2016-12-08 MED ORDER — DIPHENHYDRAMINE HCL 25 MG PO CAPS: 25.0000 mg | ORAL_CAPSULE | Freq: Four times a day (QID) | ORAL | Status: DC | PRN

## 2016-12-08 MED ORDER — BENZOCAINE-MENTHOL 20-0.5 % EX AERO: 1.0000 "application " | INHALATION_SPRAY | CUTANEOUS | Status: DC | PRN

## 2016-12-08 MED ORDER — MEDROXYPROGESTERONE ACETATE 150 MG/ML IM SUSP: 150.0000 mg | INTRAMUSCULAR | Status: DC | PRN

## 2016-12-08 MED ORDER — SENNOSIDES-DOCUSATE SODIUM 8.6-50 MG PO TABS
2.0000 | ORAL_TABLET | ORAL | Status: DC
  Filled 2016-12-08: qty 2

## 2016-12-08 MED ORDER — COCONUT OIL OIL: 1.0000 "application " | TOPICAL_OIL | Status: DC | PRN

## 2016-12-08 MED ORDER — OXYTOCIN 40 UNITS IN LACTATED RINGERS INFUSION - SIMPLE MED
2.5000 [IU]/h | INTRAVENOUS | Status: DC
  Administered 2016-12-08: 2.5 [IU]/h via INTRAVENOUS

## 2016-12-08 MED ORDER — SIMETHICONE 80 MG PO CHEW: 80.0000 mg | CHEWABLE_TABLET | ORAL | Status: DC | PRN

## 2016-12-08 MED ORDER — CEFAZOLIN SODIUM-DEXTROSE 2-4 GM/100ML-% IV SOLN
2.0000 g | Freq: Three times a day (TID) | INTRAVENOUS | Status: DC
  Administered 2016-12-08: 2 g via INTRAVENOUS
  Filled 2016-12-08 (×3): qty 100

## 2016-12-08 MED ORDER — MEASLES, MUMPS & RUBELLA VAC ~~LOC~~ INJ
0.5000 mL | INJECTION | Freq: Once | SUBCUTANEOUS | Status: DC
  Filled 2016-12-08: qty 0.5

## 2016-12-08 NOTE — Progress Notes (Signed)
No signs or symptoms of HSV by history or exam.  The patient is on L and D. Stable FHR.  Cx 7-8/ 80%/ +1- +2 with pushing. Will wait for baby to descend naturally.  AVS

## 2016-12-08 NOTE — Progress Notes (Signed)
The patient declines Lovenox. AVS

## 2016-12-08 NOTE — Progress Notes (Addendum)
Hospital day # 14 pregnancy at [redacted]w[redacted]d--cervical incompetence.  S:  Pt had trouble sleeping throughout night.  States "does not think Procardia working". Pt states pain level the has not increased.  Denies bloody show or leakage of fluid.       Perception of contractions: none, irregular, with pressure      Vaginal bleeding: none now       Vaginal discharge:  no significant change  O: BP 109/64 (BP Location: Left Arm)   Pulse 98   Temp 98 F (36.7 C) (Oral)   Resp 18   Ht 5\' 7"  (1.702 m)   Wt 101.2 kg (223 lb)   LMP 06/07/2016   SpO2 99%   BMI 34.93 kg/m       Fetal tracings:      Contractions:         Uterus gravid, consistent with 25.2 weeks and non-tender      Extremities: extremities normal, atraumatic, no cyanosis or edema and no significant edema and no signs of DVT          Labs:  None       Meds cyclobenzaprine  10 mg Oral TID   . docusate sodium  100 mg Oral Daily  . enoxaparin (LOVENOX) injection  40 mg Subcutaneous Q24H  . pantoprazole  40 mg Oral Daily  . prenatal multivitamin  1 tablet Oral Q1200  . progesterone  200 mg Vaginal QHS  . sodium chloride flush  3 mL Intravenous Q12H  . valACYclovir  500 mg Oral Daily     A: [redacted]w[redacted]d with cervical incompetence     Controlled Cat 1 strip  P: Continue current plan of care      Stopping Magnesium Sulfate at 0700.      MDs will follow  Starla Link CNM, MSN 12/08/2016 6:32 AM

## 2016-12-08 NOTE — Progress Notes (Signed)
Pt C/O contractions. cx 7/+2 To L and D.  AVS

## 2016-12-08 NOTE — Lactation Note (Signed)
This note was copied from a baby's chart. Lactation Consultation Note  Patient Name: Annette Greene [id number] Date: 12/08/2016 Reason for consult: Initial assessment Baby at 3 hr of life. Went to 3rd floor to talk with mom and RN stated mom has been in the NICU. The Rn has already set up the DEBP and taught manual expression.   Maternal Data    Feeding    LATCH Score                   Interventions    Lactation Tools Discussed/Used     Consult Status Consult Status: Follow-up Date: 12/09/16 Follow-up type: In-patient    Denzil Hughes 12/08/2016, 2:34 PM

## 2016-12-09 LAB — CBC
HCT: 27.5 % — ABNORMAL LOW (ref 36.0–46.0)
Hemoglobin: 9.4 g/dL — ABNORMAL LOW (ref 12.0–15.0)
MCH: 29.2 pg (ref 26.0–34.0)
MCHC: 34.2 g/dL (ref 30.0–36.0)
MCV: 85.4 fL (ref 78.0–100.0)
PLATELETS: 218 10*3/uL (ref 150–400)
RBC: 3.22 MIL/uL — ABNORMAL LOW (ref 3.87–5.11)
RDW: 13.4 % (ref 11.5–15.5)
WBC: 9.7 10*3/uL (ref 4.0–10.5)

## 2016-12-09 LAB — RPR: RPR Ser Ql: NONREACTIVE

## 2016-12-09 NOTE — Progress Notes (Signed)
Subjective: Postpartum Day 1: Vaginal delivery, no laceration Patient up ad lib, reports no syncope or dizziness. Feeding: Breastfeeding.  Plans on pumping.  Infant in NICU Contraceptive plan:  undecided  Objective: Vital signs in last 24 hours: Temp:  [97.7 F (36.5 C)-99.2 F (37.3 C)] 97.9 F (36.6 C) (09/20 0400) Pulse Rate:  [78-113] 88 (09/20 0400) Resp:  [14-20] 16 (09/20 0400) BP: (90-134)/(52-89) 90/52 (09/20 0400) SpO2:  [96 %-100 %] 100 % (09/20 0400)  Physical Exam:  General: alert, cooperative and no distress Lochia: appropriate Uterine Fundus: firm Perineum: N/A DVT Evaluation: No evidence of DVT seen on physical exam. Negative Homan's sign.   CBC Latest Ref Rng & Units 12/09/2016 12/08/2016 11/24/2016  WBC 4.0 - 10.5 K/uL 9.7 10.6(H) 9.3  Hemoglobin 12.0 - 15.0 g/dL 9.4(L) 10.7(L) 10.9(L)  Hematocrit 36.0 - 46.0 % 27.5(L) 31.9(L) 32.2(L)  Platelets 150 - 400 K/uL 218 231 220     Assessment/Plan: Status post vaginal delivery day 1. Stable Continue current care. Plan for discharge tomorrow, Breastfeeding, Lactation consult and Social Work consult    Pleas Koch ProtheroCNM 12/09/2016, 7:50 AM

## 2016-12-09 NOTE — Lactation Note (Signed)
This note was copied from a baby's chart. Lactation Consultation Note  Patient Name: Annette Greene [id number] Date: 12/09/2016 Reason for consult: Follow-up assessment;NICU baby  NICU baby 87 hours old. Mom reports that she is about to pump again. Assisted mom with hand expression and mom has colostrum flowing. Enc mom to pump every 2-3 hours for a total of 8-12 times/24 hours followed by hand expression. Discussed progression of milk coming to volume and supply and demand. Mom states that she is active with [facility] and gave permission to send BF referral to [facility]IC--and it was faxed to [facility] office. Mom enc to take pumping kit with her at D/C. Mom aware of pumping rooms in NICU.    Has patient been taught Hand Expression?: Yes Does the patient have breastfeeding experience prior to this delivery?: No  Feeding    LATCH Score                   Interventions    Lactation Tools Discussed/Used Tools: Pump Breast pump type: Double-Electric Breast Pump WIC Program: No Pump Review: Setup, frequency, and cleaning;Milk Storage Initiated by:: [name] rnc Date initiated:: 12/09/16   Consult Status Consult Status: Follow-up Date: 12/10/16 Follow-up type: In-patient    [name] 12/09/2016, 3:45 PM

## 2016-12-09 NOTE — Clinical Social Work Maternal (Signed)
CLINICAL SOCIAL WORK MATERNAL/CHILD NOTE  Patient Details  Name: Annette Greene MRN: [id number] Date of Birth: 08-Jul-1990  Date:  12/09/2016  Clinical Social Worker Initiating Note:  Annette Greene, [place] Date/Time: Initiated:  12/09/16/1445     Child's Name:  Annette Greene   Biological Parents:  Mother Annette Greene)   Need for Interpreter:  None   Reason for Referral:  Parental Support of Premature Babies < 32 weeks/or Critically Ill babies    Address:  [address] [place] [address]    Phone number:  [phone] (home)     Additional phone number:  Household Members/Support Persons (HM/SP):   Household Member/Support Person 1   HM/SP Name Relationship DOB or Age  HM/SP -1 Annette Greene    HM/SP -2        HM/SP -3        HM/SP -4        HM/SP -5        HM/SP -6        HM/SP -7        HM/SP -8          Natural Supports (not living in the home):  Friends, Immediate Family, Extended Family (MOB reports that she has a great support system of family and friends.)   Professional Supports: None   Employment:     Type of Work: MOB states she works for [employer]   Education:  [place] arranged:    Museum/gallery curator Resources:  [employer], Multimedia programmer (MOB is currently on her Doctor, hospital and has Medicaid as a Consulting civil engineer.  Baby will have Medicaid only.)   Other Resources:      Cultural/Religious Considerations Which May Impact Care: None stated.  MOB's facesheet notes religion as Psychologist, forensic.  Strengths:  Ability to meet basic needs , Compliance with medical plan , Pediatrician chosen, Understanding of illness   Psychotropic Medications:         Pediatrician:    [place] area  Pediatrician List:   [place]      Pediatrician Fax Number:    Risk Factors/Current Problems:   None   Cognitive State:  Able to Concentrate , Alert , Linear Thinking , Insightful , Goal Oriented    Mood/Affect:  Euthymic , Calm , Interested    CSW Assessment: CSW met with MOB in her third floor room/318 to introduce services, offer support, and complete assessment due to baby's admission to NICU at 26.2 weeks.  MOB was pleasant and receptive of CSW's visit. MOB states she feels she is coping well with baby's early delivery and 15 days in the hospital on bedrest, although it was very unexpected.  She reports feeling well informed by NICU staff and told CSW that she thinks she has asked all the questions she has at this point.  CSW commented that it is often hard to know what questions to ask in an unfamiliar situation like this and encouraged her to take notes and ask as many questions as needed to feel comfortable.  MOB smiled and agreed.  CSW informed her of the availability of family conferences and asked her to call CSW if she would like to arrange at any time.  MOB stated appreciation and states that she has attended Rounds.  CSW asked if she would  like to discuss what to expect from a NICU admission (in general terms) and she thought this would be helpful.  CSW explained that no information is specific to her son, but gave general information about milestones that he will need to reach in order to be ready to go home.  CSW encouraged her to focus on her son and how he is doing, rather than the environment and when he is able to go home.   CSW spoke about common emotions often experienced during a NICU hospitalization and encouraged MOB to allow herself to be emotional.  CSW asked her to let CSW know if she would like to process her emotions at any time and discussed the importance of monitoring her emotions during the postpartum time period, especially in light of baby's premature delivery.  MOB agreed. MOB states she has not prepared the physical space for baby yet, but has lots of  family who want to help her get everything she needs.  She reports plans to have her baby shower next month.  MOB states she is living with her grandmother at this time because she recently moved out of her apartment with plans to move into a new one, but was admitted to the hospital at that time.  She reports that her goal is to move into a new apartment around October 1st, but states she can stay with her grandmother for as long as needed. CSW informed MOB of baby's eligibility for [employer] Income and explained application process should she be interested.  MOB signed consent form and CSW provided her with a copy of baby's Admission Summary. MOB states no questions, concerns or needs at this time and thanked CSW for the visit.  CSW gave contact information and asked MOB to call any time.  CSW Plan/Description:  Patient/Family Education , Psychosocial Support and Ongoing Assessment of Needs    Annette Greene 12/09/2016, 3:43 PM

## 2016-12-10 MED ORDER — IBUPROFEN 600 MG PO TABS: 600.0000 mg | ORAL_TABLET | Freq: Four times a day (QID) | ORAL | 2 refills | Status: DC | PRN

## 2016-12-10 NOTE — Discharge Summary (Signed)
OB Discharge Summary     Patient Name: Annette Greene DOB: 1990/10/21 MRN: [id number]  Date of admission: 11/24/2016 Delivering MD: Ena Dawley   Date of delivery 12/08/16  Date of discharge: 12/10/2016  Admitting diagnosis: 24WKS, BLEEDING,DECREASED FM Intrauterine pregnancy: [redacted]w[redacted]d     Secondary diagnosis:  Active Problems:   Cervical incompetence   Labor and delivery, indication for care   Premature delivery  Additional problems: NA     Discharge diagnosis: Preterm Pregnancy Delivered                                                                                                Post partum procedures:NA  Augmentation: NA  Complications: None  Hospital course:  Onset of Labor With Vaginal Delivery     26 y.o. yo G1P0 at [redacted]w[redacted]d was admitted on 11/24/2016 with cervical incompetence.  She was maintained on BR throughout her hospitalization.  Received Betamethasone course.  Had increased contractions beginning on 9/19, treated with IV hydration, Terbultaline x 1 dose, Procardia.  Magnesium sulfate was begun for neuroprophylaxis on 12/07/16 and infused x 12 hours.  Patient then had advancement of labor on the morning of 9/19, and progressed to spontaneous delivery with Dr. Raphael Gibney.  Baby was vigorous at birth, was taken to NICU for prematurity.  Patient had an uncomplicated labor course as follows:  Membrane Rupture Time/Date: 10:54 AM ,12/08/2016   Intrapartum Procedures: Episiotomy: None [1]                                         Lacerations:  None [1]  Patient had a delivery of a Viable infant. 12/08/2016  Information for the patient's newborn:  Dalana, Pfahler [[id number]]       Pateint had an uncomplicated postpartum course.  She is ambulating, tolerating a regular diet, passing flatus, and urinating well. Patient is discharged home in stable condition on 12/10/16. Baby is stable in NICU.   Physical exam  Vitals:   12/08/16 2350 12/09/16 0400 12/09/16 0753 12/09/16 2029   BP: (!) 120/59 (!) 90/52 (!) 106/40 (!) 100/54  Pulse: 91 88 83 84  Resp: 20 16 18 18   Temp: 99 F (37.2 C) 97.9 F (36.6 C) 98.4 F (36.9 C) 98.5 F (36.9 C)  TempSrc: Oral Oral Oral Oral  SpO2: 100% 100% 100% 100%  Weight:      Height:       General: alert Lochia: appropriate Uterine Fundus: firm Incision: Intact perineum DVT Evaluation: No evidence of DVT seen on physical exam. Negative Homan's sign. Labs: Lab Results  Component Value Date   WBC 9.7 12/09/2016   HGB 9.4 (L) 12/09/2016   HCT 27.5 (L) 12/09/2016   MCV 85.4 12/09/2016   PLT 218 12/09/2016   CMP Latest Ref Rng & Units 03/06/2016  Glucose 65 - 99 mg/dL 89  BUN 6 - 20 mg/dL 6  Creatinine 0.44 - 1.00 mg/dL 0.88  Sodium 135 - 145 mmol/L 138  Potassium 3.5 - 5.1  mmol/L 3.9  Chloride 101 - 111 mmol/L 104  CO2 22 - 32 mmol/L 28  Calcium 8.9 - 10.3 mg/dL 9.0  Total Protein 6.5 - 8.1 g/dL 6.9  Total Bilirubin 0.3 - 1.2 mg/dL 0.7  Alkaline Phos 38 - 126 U/L 40  AST 15 - 41 U/L 22  ALT 14 - 54 U/L 17    Discharge instruction: per After Visit Summary and "Baby and Me Booklet".  After visit meds:  Allergies as of 12/10/2016   No Known Allergies     Medication List    STOP taking these medications   valACYclovir 500 MG tablet Commonly known as:  VALTREX     TAKE these medications   docusate sodium 100 MG capsule Commonly known as:  COLACE Take 1 capsule (100 mg total) by mouth 2 (two) times daily.   ibuprofen 600 MG tablet Commonly known as:  ADVIL,MOTRIN Take 1 tablet (600 mg total) by mouth every 6 (six) hours as needed.   multivitamin-prenatal 27-0.8 MG Tabs tablet Take 1 tablet by mouth daily.            Discharge Care Instructions        Start     Ordered   12/10/16 0000  ibuprofen (ADVIL,MOTRIN) 600 MG tablet  Every 6 hours PRN    Question:  Supervising Provider  Answer:  Crawford Givens   12/10/16 0816   12/10/16 0000  Discharge instructions    Comments:  Per [facility] handout    12/10/16 0816   12/10/16 0000  Diet - low sodium heart healthy     12/10/16 0816   12/05/16 0000  OB RESULT CONSOLE Group B Strep    Comments:  This external order was created through the Results Console.   12/05/16 0223      Diet: routine diet  Activity: Advance as tolerated. Pelvic rest for 6 weeks.   Outpatient follow up:2 weeks--wants to return to work 01/27/17. Follow up Appt:No future appointments. Follow up Visit:No Follow-up on file.  Postpartum contraception: IUD Mirena at 6 weeks.  Newborn Data: Live born female  Birth Weight: 1 lb 12.2 oz (800 g) APGAR: 7, 8  Baby Feeding: Breast, pumping Disposition:NICU   12/10/2016 Donnel Saxon, CNM

## 2016-12-10 NOTE — Progress Notes (Signed)
CSW met with MOB in her third floor room to see how she is coping on day of her discharge.  MOB smiled and states she is doing well and that baby is "great."  She then became somewhat tearful and told CSW, "I had a breakdown last night."  CSW asked her to talk about her breakdown if she felt comfortable.  She replied, "I feel like this is my fault."  CSW provided supportive brief counseling as MOB processed her feelings and assisted her in re-framing her thinking regarding her son's premature birth.  MOB concludes that she did nothing wrong and that she does not have anything to feel guilty about.  CSW spoke about guilt and how it can be a productive emotion when attempting behavior change.  But when there is no identified behavior needing change, guilt is an unnecessary emotion to hold on to.  MOB reports that her OB also told her that she could not control baby's early birth and that her medical condition is hereditary.  CSW asked MOB to think about what she did for baby and highlighted her maternal instinct to come to the hospital when she knew something was wrong.  CSW reminded her of lying "on her head" in a hospital bed for 15 days trying to keep baby in and that was a choice she made to keep him as healthy as possible.  MOB smiled and agreed.  She seemed to appreciate CSW's validation of her feelings and permission to let go of her guilt.   CSW asked MOB to call any time.  MOB thanked CSW. 

## 2016-12-10 NOTE — Discharge Instructions (Signed)

## 2016-12-10 NOTE — Lactation Note (Signed)
This note was copied from a baby's chart. Lactation Consultation Note  Patient Name: Boy Zineb Glade UWCNP'S Date: 12/10/2016   Mom is currently pumping & is expressing colostrum. She is comfortable with pumping & plans on continuing to pump 3 hrs. WIC is bringing her DEBP this afternoon before she leaves. Mom also has an Melbeta ordered that will be arriving around Tuesday. Mom was also shown how to assemble & use hand pump that was included in pump kit. Extra milk bottles provided.   Matthias Hughs [facility] 12/10/2016, 12:43 PM

## 2017-01-04 ENCOUNTER — Encounter (HOSPITAL_COMMUNITY): Payer: Self-pay | Admitting: Emergency Medicine

## 2017-01-04 ENCOUNTER — Emergency Department (HOSPITAL_COMMUNITY)
Admission: EM | Admit: 2017-01-04 | Discharge: 2017-01-04 | Disposition: A | Discharge status: Home / Self Care | Payer: Managed Care, Other (non HMO) | Attending: Emergency Medicine | Admitting: Emergency Medicine

## 2017-01-04 DIAGNOSIS — M545 Low back pain, unspecified: Secondary | ICD-10-CM

## 2017-01-04 DIAGNOSIS — Z87891 Personal history of nicotine dependence: Secondary | ICD-10-CM | POA: Diagnosis not present

## 2017-01-04 LAB — URINALYSIS, ROUTINE W REFLEX MICROSCOPIC
BILIRUBIN URINE: NEGATIVE
Bacteria, UA: NONE SEEN
GLUCOSE, UA: NEGATIVE mg/dL
HGB URINE DIPSTICK: NEGATIVE
Ketones, ur: NEGATIVE mg/dL
NITRITE: NEGATIVE
PH: 5 (ref 5.0–8.0)
Protein, ur: NEGATIVE mg/dL
SPECIFIC GRAVITY, URINE: 1.026 (ref 1.005–1.030)

## 2017-01-04 MED ORDER — OXYCODONE-ACETAMINOPHEN 5-325 MG PO TABS
1.0000 | ORAL_TABLET | Freq: Once | ORAL | Status: AC
  Administered 2017-01-04: 1 via ORAL

## 2017-01-04 MED ORDER — NAPROXEN 500 MG PO TABS: 500.0000 mg | ORAL_TABLET | Freq: Two times a day (BID) | ORAL | 0 refills | Status: DC

## 2017-01-04 MED ORDER — KETOROLAC TROMETHAMINE 30 MG/ML IJ SOLN
30.0000 mg | Freq: Once | INTRAMUSCULAR | Status: AC
  Administered 2017-01-04: 30 mg via INTRAMUSCULAR
  Filled 2017-01-04: qty 1

## 2017-01-04 MED ORDER — OXYCODONE-ACETAMINOPHEN 5-325 MG PO TABS
ORAL_TABLET | ORAL | Status: AC
  Filled 2017-01-04: qty 1

## 2017-01-04 NOTE — ED Provider Notes (Signed)
[facility] Provider Note   CSN: [id number] Arrival date & time: 01/04/17  0107     History   Chief Complaint Chief Complaint  Patient presents with  . Back Pain    HPI Annette Greene is a 26 y.o. female.  HPI  This a 26 year old female who presents with back pain.  Patient recently delivered a preterm infant 3 weeks ago. She reports that she had a vaginal delivery without epidural. Since that time she's had progressive bilateral lower back pain. She saw her OB/GYN who prescribed Flexeril. This is not helping. She is not taking any other medications. Currently she rates her pain 8 out of 10. It is nonradiating. She denies any urinary tract symptoms. Pain is worse with certain movements and positions. She denies any weakness, numbness, tingling of the lower extremity. She denies any bowel or bladder difficulty. No fevers.  Past Medical History:  Diagnosis Date  . Medical history non-contributory     Patient Active Problem List   Diagnosis Date Noted  . Premature delivery 12/10/2016  . Labor and delivery, indication for care 12/08/2016  . Cervical incompetence 11/24/2016    Past Surgical History:  Procedure Laterality Date  . TONSILLECTOMY    . WISDOM TOOTH EXTRACTION     all 4 teeth    OB History    Gravida Para Term Preterm AB Living   1             SAB TAB Ectopic Multiple Live Births                   Home Medications    Prior to Admission medications   Medication Sig Start Date End Date Taking? Authorizing Provider  docusate sodium (COLACE) 100 MG capsule Take 1 capsule (100 mg total) by mouth 2 (two) times daily. 11/04/16 11/04/17  Gavin Pound, CNM  ibuprofen (ADVIL,MOTRIN) 600 MG tablet Take 1 tablet (600 mg total) by mouth every 6 (six) hours as needed. 12/10/16   Donnel Saxon, CNM  naproxen (NAPROSYN) 500 MG tablet Take 1 tablet (500 mg total) by mouth 2 (two) times daily. 01/04/17   Teddie Curd, Barbette Hair, MD    Prenatal Vit-Fe Fumarate-FA (MULTIVITAMIN-PRENATAL) 27-0.8 MG TABS tablet Take 1 tablet by mouth daily.    [provider]    Family History Family History  Problem Relation Age of Onset  . Healthy Mother   . Healthy Father   . Healthy Sister   . Healthy Maternal Grandmother   . Healthy Paternal Grandmother   . Healthy Paternal Grandfather   . Healthy Sister     Social History Social History  Substance Use Topics  . Smoking status: Former Smoker    Packs/day: 0.50    Types: Cigarettes    Quit date: 06/24/2016  . Smokeless tobacco: Never Used     Comment: stopped since pregnant  . Alcohol use Yes     Comment: stopped with pregnancy     Allergies   Patient has no known allergies.   Review of Systems Review of Systems  Constitutional: Negative for fever.  Respiratory: Negative for shortness of breath.   Genitourinary: Negative for dysuria.  Musculoskeletal: Positive for back pain.  Neurological: Negative for weakness and numbness.  All other systems reviewed and are negative.    Physical Exam Updated Vital Signs BP 117/61 (BP Location: Left Arm)   Pulse 66   Temp 98.3 F (36.8 C) (Oral)   Resp 18  Ht 5\' 7"  (1.702 m)   Wt 106.6 kg (235 lb)   LMP 06/07/2016   SpO2 100%   BMI 36.81 kg/m   Physical Exam  Constitutional: She is oriented to person, place, and time. She appears well-developed and well-nourished.  obese  HENT:  Head: Normocephalic and atraumatic.  Cardiovascular: Normal rate, regular rhythm and normal heart sounds.   Pulmonary/Chest: Effort normal and breath sounds normal. No respiratory distress. She has no wheezes.  Abdominal: Soft. There is no tenderness.  Musculoskeletal:  No midline thoracic or L-spine tenderness palpation, step-off, or deformity, tenderness palpation of bilateral paraspinous muscles of the lower lumbar spine  Neurological: She is alert and oriented to person, place, and time.  5 out of 5 strength bilateral  lower extremities, normal reflexes bilaterally, no clonus, negative straight leg raise  Skin: Skin is warm and dry.  Psychiatric: She has a normal mood and affect.  Nursing note and vitals reviewed.    ED Treatments / Results  Labs (all labs ordered are listed, but only abnormal results are displayed) Labs Reviewed  URINALYSIS, ROUTINE W REFLEX MICROSCOPIC - Abnormal; Notable for the following:       Result Value   APPearance HAZY (*)    Leukocytes, UA TRACE (*)    Squamous Epithelial / LPF 0-5 (*)    All other components within normal limits    EKG  EKG Interpretation None       Radiology No results found.  Procedures Procedures (including critical care time)  Medications Ordered in ED Medications  oxyCODONE-acetaminophen (PERCOCET/ROXICET) 5-325 MG per tablet 1 tablet (1 tablet Oral Given 01/04/17 0117)  ketorolac (TORADOL) 30 MG/ML injection 30 mg (30 mg Intramuscular Given 01/04/17 0314)     Initial Impression / Assessment and Plan / ED Course  I have reviewed the triage vital signs and the nursing notes.  Pertinent labs & imaging results that were available during my care of the patient were reviewed by me and considered in my medical decision making (see chart for details).     Patient presents with back pain since delivery. No red flags. She did not have an epidural. Currently she's not taking anything but Flexeril. No anti-inflammatories. She is neurologically intact. No signs or symptoms of cauda equina. Patient was given Toradol. No indication for imaging at this time. Suspect musculoskeletal etiology given muscular tenderness on exam. Recommend naproxen twice daily and follow-up with primary physician for physical therapy referral.  After history, exam, and medical workup I feel the patient has been appropriately medically screened and is safe for discharge home. Pertinent diagnoses were discussed with the patient. Patient was given return  precautions.   Final Clinical Impressions(s) / ED Diagnoses   Final diagnoses:  Acute bilateral low back pain without sciatica    New Prescriptions New Prescriptions   NAPROXEN (NAPROSYN) 500 MG TABLET    Take 1 tablet (500 mg total) by mouth 2 (two) times daily.     Merryl Hacker, MD 01/04/17 [phone]

## 2017-01-04 NOTE — ED Notes (Signed)
Pt presents to ED for back pain since her late pregnancy and giving birth 3 weeks ago.  Pt states she was assessed by her PCP and given flexeril but they are not woring.  Pt denies loss of bowel or bladder control, denies numbness or tingling in her legs, denies changes in bleeding or vaginal discharge since birth, denies fevers or chills.

## 2017-01-04 NOTE — Discharge Instructions (Signed)
You were seen today for back pain. Your workup is reassuring. This is likely musculoskeletal. Take naproxen as needed for discomfort. Follow-up with your primary physician for physical therapy referral if not improving.

## 2017-01-06 ENCOUNTER — Ambulatory Visit: Payer: Self-pay

## 2017-01-06 NOTE — Lactation Note (Signed)
This note was copied from a baby's chart. Lactation Consultation Note  Patient Name: Annette Greene [id number] Date: 01/06/2017 Reason for consult: Follow-up assessment;NICU baby  NICU baby 66 weeks old. Mom reports that she was able to collect 3-4 ounces initially--at 1 week, but then was only collection an ounce at week 3, and then drops now at 4 weeks. Mom states that she has been taking Fenugreek, and continuing to pump, but she is still only getting drops. Mom states that she is able to hand express more versus the DEBP, so enc mom to pump first and then hand express. Mom believes that stress and not sleeping at night is what has caused her milk to stop flowing. Enc mom to pump before sleeping, and then attempt to sleep for at least 5 hours straight. Enc mom to pump every 2 hours for a few times when she gets up in the morning and see if sleeping will increase her EBM volumes. Enc mom to call for assistance as needed.   Maternal Data    Feeding Feeding Type: Breast Milk  LATCH Score                   Interventions    Lactation Tools Discussed/Used     Consult Status Consult Status: PRN    [name] 01/06/2017, 3:27 PM

## 2017-06-06 ENCOUNTER — Encounter (HOSPITAL_COMMUNITY): Payer: Self-pay

## 2017-07-02 ENCOUNTER — Other Ambulatory Visit: Payer: Self-pay

## 2017-07-02 ENCOUNTER — Encounter (HOSPITAL_BASED_OUTPATIENT_CLINIC_OR_DEPARTMENT_OTHER): Payer: Self-pay

## 2017-07-02 ENCOUNTER — Emergency Department (HOSPITAL_BASED_OUTPATIENT_CLINIC_OR_DEPARTMENT_OTHER)
Admission: EM | Admit: 2017-07-02 | Discharge: 2017-07-02 | Disposition: A | Discharge status: Home / Self Care | Payer: Managed Care, Other (non HMO) | Attending: Emergency Medicine | Admitting: Emergency Medicine

## 2017-07-02 DIAGNOSIS — M545 Low back pain: Secondary | ICD-10-CM | POA: Insufficient documentation

## 2017-07-02 DIAGNOSIS — J069 Acute upper respiratory infection, unspecified: Secondary | ICD-10-CM | POA: Insufficient documentation

## 2017-07-02 DIAGNOSIS — Z87891 Personal history of nicotine dependence: Secondary | ICD-10-CM | POA: Insufficient documentation

## 2017-07-02 DIAGNOSIS — R05 Cough: Secondary | ICD-10-CM | POA: Diagnosis present

## 2017-07-02 MED ORDER — CETIRIZINE HCL 10 MG PO TABS: 10.0000 mg | ORAL_TABLET | Freq: Every day | ORAL | 0 refills | Status: DC

## 2017-07-02 NOTE — ED Provider Notes (Signed)
[facility] Provider Note   CSN: [id number] Arrival date & time: 07/02/17  0002     History   Chief Complaint Chief Complaint  Patient presents with  . URI    HPI Annette Greene is a 27 y.o. female.  Patient is a 27 year old female who presents with URI symptoms.  She is a 2-day history of runny nose congestion and a cough which is occasionally productive.  No known fevers.  No nausea or vomiting.  She has a little bit of pain in her lower back.  She has had similar symptoms in the past and feels like it has been exacerbated during this URI.  She denies any urinary symptoms.  No radiation of the pain down her legs.  No numbness or weakness to her extremities.  No abdominal pain.  She has been using some ibuprofen with some improvement in symptoms.     Past Medical History:  Diagnosis Date  . Medical history non-contributory     Patient Active Problem List   Diagnosis Date Noted  . Premature delivery 12/10/2016  . Labor and delivery, indication for care 12/08/2016  . Cervical incompetence 11/24/2016    Past Surgical History:  Procedure Laterality Date  . TONSILLECTOMY    . WISDOM TOOTH EXTRACTION     all 4 teeth     OB History    Gravida  1   Para      Term      Preterm      AB      Living        SAB      TAB      Ectopic      Multiple      Live Births               Home Medications    Prior to Admission medications   Medication Sig Start Date End Date Taking? Authorizing Provider  cetirizine (ZYRTEC ALLERGY) 10 MG tablet Take 1 tablet (10 mg total) by mouth daily. 07/02/17   Malvin Johns, MD  docusate sodium (COLACE) 100 MG capsule Take 1 capsule (100 mg total) by mouth 2 (two) times daily. 11/04/16 11/04/17  Gavin Pound, CNM  ibuprofen (ADVIL,MOTRIN) 600 MG tablet Take 1 tablet (600 mg total) by mouth every 6 (six) hours as needed. 12/10/16   Donnel Saxon, CNM  naproxen (NAPROSYN) 500 MG tablet Take 1  tablet (500 mg total) by mouth 2 (two) times daily. 01/04/17   Horton, Barbette Hair, MD  Prenatal Vit-Fe Fumarate-FA (MULTIVITAMIN-PRENATAL) 27-0.8 MG TABS tablet Take 1 tablet by mouth daily.    [provider]    Family History Family History  Problem Relation Age of Onset  . Healthy Mother   . Healthy Father   . Healthy Sister   . Healthy Maternal Grandmother   . Healthy Paternal Grandmother   . Healthy Paternal Grandfather   . Healthy Sister     Social History Social History   Tobacco Use  . Smoking status: Former Smoker    Packs/day: 0.50    Types: Cigarettes    Last attempt to quit: 06/24/2016    Years since quitting: 1.0  . Smokeless tobacco: Never Used  . Tobacco comment: stopped since pregnant  Substance Use Topics  . Alcohol use: Yes    Comment: stopped with pregnancy  . Drug use: No     Allergies   Patient has no known allergies.   Review of Systems Review  of Systems  Constitutional: Negative for chills, diaphoresis, fatigue and fever.  HENT: Positive for congestion, rhinorrhea, sneezing and sore throat.   Eyes: Negative.   Respiratory: Positive for cough. Negative for chest tightness and shortness of breath.   Cardiovascular: Negative for chest pain and leg swelling.  Gastrointestinal: Negative for abdominal pain, blood in stool, diarrhea, nausea and vomiting.  Genitourinary: Negative for difficulty urinating, flank pain, frequency and hematuria.  Musculoskeletal: Positive for back pain. Negative for arthralgias.  Skin: Negative for rash.  Neurological: Negative for dizziness, speech difficulty, weakness, numbness and headaches.     Physical Exam Updated Vital Signs BP 125/81 (BP Location: Right Wrist)   Pulse 62   Temp 98.6 F (37 C) (Oral)   Resp 16   Ht 5\' 7"  (1.702 m)   Wt 108.9 kg (240 lb)   LMP 06/09/2017   SpO2 100%   BMI 37.59 kg/m   Physical Exam  Constitutional: She is oriented to person, place, and time. She appears  well-developed and well-nourished.  HENT:  Head: Normocephalic and atraumatic.  Right Ear: External ear normal.  Left Ear: External ear normal.  Mouth/Throat: Oropharynx is clear and moist. No oropharyngeal exudate.  Oropharynx clear without erythema or exudates, uvula is midline, no trismus  Eyes: Pupils are equal, round, and reactive to light.  Neck: Normal range of motion. Neck supple.  Cardiovascular: Normal rate, regular rhythm and normal heart sounds.  Pulmonary/Chest: Effort normal and breath sounds normal. No respiratory distress. She has no wheezes. She has no rales. She exhibits no tenderness.  Abdominal: Soft. Bowel sounds are normal. There is no tenderness. There is no rebound and no guarding.  Musculoskeletal: Normal range of motion. She exhibits no edema.  Mild tenderness in her lower lumbar spine.  No step-offs or deformities.  Negative straight leg raise bilaterally.  Patient has normal motor function and sensation to the lower extremities bilaterally  Lymphadenopathy:    She has no cervical adenopathy.  Neurological: She is alert and oriented to person, place, and time.  Skin: Skin is warm and dry. No rash noted.  Psychiatric: She has a normal mood and affect.     ED Treatments / Results  Labs (all labs ordered are listed, but only abnormal results are displayed) Labs Reviewed - No data to display  EKG None  Radiology No results found.  Procedures Procedures (including critical care time)  Medications Ordered in ED Medications - No data to display   Initial Impression / Assessment and Plan / ED Course  I have reviewed the triage vital signs and the nursing notes.  Pertinent labs & imaging results that were available during my care of the patient were reviewed by me and considered in my medical decision making (see chart for details).     Patient presents with URI symptoms.  She has no clinical symptoms of strep throat or pneumonia.  She is otherwise  well-appearing.  She has some minor low back pain which is likely musculoskeletal in nature.  There is no radicular symptoms or neurologic deficits.  She was advised in symptomatic care.  She was given a prescription for Zyrtec to help with symptomatic relief.  Return precautions were given.  Of note, she had a recent pregnancy but she is not breast-feeding.  Final Clinical Impressions(s) / ED Diagnoses   Final diagnoses:  Viral upper respiratory tract infection    ED Discharge Orders        Ordered    cetirizine (ZYRTEC ALLERGY)  10 MG tablet  Daily     07/02/17 0039       Malvin Johns, MD 07/02/17 [phone]

## 2017-07-02 NOTE — ED Triage Notes (Signed)
Pt c/o cough, sore throat and congestion for the last few days, last dose of any medication was ibuprofen on Thursday night

## 2017-07-02 NOTE — ED Notes (Signed)
Pt verbalizes understanding of d/c instructions and denies any further needs at this time. 

## 2017-07-02 NOTE — ED Notes (Signed)
ED Provider at bedside. 

## 2017-11-24 ENCOUNTER — Encounter (HOSPITAL_BASED_OUTPATIENT_CLINIC_OR_DEPARTMENT_OTHER): Payer: Self-pay | Admitting: Emergency Medicine

## 2017-11-24 ENCOUNTER — Inpatient Hospital Stay (HOSPITAL_BASED_OUTPATIENT_CLINIC_OR_DEPARTMENT_OTHER)
Admission: EM | Admit: 2017-11-24 | Discharge: 2017-11-24 | Disposition: A | Discharge status: Other Acute Inpatient Hospital | Payer: Managed Care, Other (non HMO) | Attending: Emergency Medicine | Admitting: Emergency Medicine

## 2017-11-24 ENCOUNTER — Inpatient Hospital Stay (HOSPITAL_COMMUNITY): Payer: Managed Care, Other (non HMO)

## 2017-11-24 ENCOUNTER — Other Ambulatory Visit: Payer: Self-pay

## 2017-11-24 DIAGNOSIS — Z79899 Other long term (current) drug therapy: Secondary | ICD-10-CM | POA: Insufficient documentation

## 2017-11-24 DIAGNOSIS — O23519 Infections of cervix in pregnancy, unspecified trimester: Secondary | ICD-10-CM | POA: Diagnosis not present

## 2017-11-24 DIAGNOSIS — O3680X0 Pregnancy with inconclusive fetal viability, not applicable or unspecified: Secondary | ICD-10-CM

## 2017-11-24 DIAGNOSIS — Z3A00 Weeks of gestation of pregnancy not specified: Secondary | ICD-10-CM | POA: Insufficient documentation

## 2017-11-24 DIAGNOSIS — N3000 Acute cystitis without hematuria: Secondary | ICD-10-CM

## 2017-11-24 DIAGNOSIS — O2310 Infections of bladder in pregnancy, unspecified trimester: Secondary | ICD-10-CM | POA: Insufficient documentation

## 2017-11-24 DIAGNOSIS — R109 Unspecified abdominal pain: Secondary | ICD-10-CM | POA: Diagnosis not present

## 2017-11-24 DIAGNOSIS — Z3201 Encounter for pregnancy test, result positive: Secondary | ICD-10-CM | POA: Diagnosis not present

## 2017-11-24 DIAGNOSIS — O26899 Other specified pregnancy related conditions, unspecified trimester: Secondary | ICD-10-CM

## 2017-11-24 DIAGNOSIS — Z87891 Personal history of nicotine dependence: Secondary | ICD-10-CM | POA: Insufficient documentation

## 2017-11-24 DIAGNOSIS — O9989 Other specified diseases and conditions complicating pregnancy, childbirth and the puerperium: Secondary | ICD-10-CM | POA: Diagnosis present

## 2017-11-24 DIAGNOSIS — N72 Inflammatory disease of cervix uteri: Secondary | ICD-10-CM

## 2017-11-24 LAB — URINALYSIS, ROUTINE W REFLEX MICROSCOPIC
BILIRUBIN URINE: NEGATIVE
Bilirubin Urine: NEGATIVE
Glucose, UA: NEGATIVE mg/dL
Glucose, UA: NEGATIVE mg/dL
HGB URINE DIPSTICK: NEGATIVE
KETONES UR: 15 mg/dL — AB
Ketones, ur: NEGATIVE mg/dL
LEUKOCYTES UA: NEGATIVE
Leukocytes, UA: NEGATIVE
NITRITE: NEGATIVE
NITRITE: NEGATIVE
PROTEIN: NEGATIVE mg/dL
PROTEIN: NEGATIVE mg/dL
SPECIFIC GRAVITY, URINE: 1.025 (ref 1.005–1.030)
SPECIFIC GRAVITY, URINE: 1.023 (ref 1.005–1.030)
pH: 6 (ref 5.0–8.0)
pH: 5 (ref 5.0–8.0)

## 2017-11-24 LAB — PREGNANCY, URINE: PREG TEST UR: POSITIVE — AB

## 2017-11-24 LAB — CBC WITH DIFFERENTIAL/PLATELET
BASOS ABS: 0 10*3/uL (ref 0.0–0.1)
Basophils Relative: 0 %
EOS ABS: 0.1 10*3/uL (ref 0.0–0.7)
EOS PCT: 1 %
HCT: 32.7 % — ABNORMAL LOW (ref 36.0–46.0)
HEMOGLOBIN: 10.7 g/dL — AB (ref 12.0–15.0)
LYMPHS PCT: 32 %
Lymphs Abs: 3.2 10*3/uL (ref 0.7–4.0)
MCH: 27.5 pg (ref 26.0–34.0)
MCHC: 32.7 g/dL (ref 30.0–36.0)
MCV: 84.1 fL (ref 78.0–100.0)
MONO ABS: 0.9 10*3/uL (ref 0.1–1.0)
Monocytes Relative: 8 %
Neutro Abs: 5.9 10*3/uL (ref 1.7–7.7)
Neutrophils Relative %: 59 %
Platelets: 274 10*3/uL (ref 150–400)
RBC: 3.89 MIL/uL (ref 3.87–5.11)
RDW: 16.2 % — ABNORMAL HIGH (ref 11.5–15.5)
WBC: 10.1 10*3/uL (ref 4.0–10.5)

## 2017-11-24 LAB — URINALYSIS, MICROSCOPIC (REFLEX)

## 2017-11-24 LAB — BASIC METABOLIC PANEL
ANION GAP: 8 (ref 5–15)
BUN: 12 mg/dL (ref 6–20)
CHLORIDE: 104 mmol/L (ref 98–111)
CO2: 25 mmol/L (ref 22–32)
Calcium: 8.7 mg/dL — ABNORMAL LOW (ref 8.9–10.3)
Creatinine, Ser: 0.68 mg/dL (ref 0.44–1.00)
GFR calc non Af Amer: >60 mL/min (ref >60)
Glucose, Bld: 88 mg/dL (ref 70–99)
POTASSIUM: 3.6 mmol/L (ref 3.5–5.1)
SODIUM: 137 mmol/L (ref 135–145)

## 2017-11-24 LAB — WET PREP, GENITAL
Clue Cells Wet Prep HPF POC: NONE SEEN
SPERM: NONE SEEN
Trich, Wet Prep: NONE SEEN
Yeast Wet Prep HPF POC: NONE SEEN

## 2017-11-24 LAB — HCG, QUANTITATIVE, PREGNANCY: HCG, BETA CHAIN, QUANT, S: 8004 m[IU]/mL — AB (ref <5)

## 2017-11-24 MED ORDER — NITROFURANTOIN MONOHYD MACRO 100 MG PO CAPS: 100.0000 mg | ORAL_CAPSULE | Freq: Two times a day (BID) | ORAL | 0 refills | Status: DC

## 2017-11-24 MED ORDER — CEFTRIAXONE SODIUM 1 G IJ SOLR
1.0000 g | Freq: Once | INTRAMUSCULAR | Status: AC
  Administered 2017-11-24: 1 g via INTRAMUSCULAR
  Filled 2017-11-24: qty 10

## 2017-11-24 MED ORDER — AZITHROMYCIN 1 G PO PACK
1.0000 g | PACK | Freq: Once | ORAL | Status: AC
  Administered 2017-11-24: 1 g via ORAL
  Filled 2017-11-24: qty 1

## 2017-11-24 MED ORDER — NITROFURANTOIN MONOHYD MACRO 100 MG PO CAPS: 100.0000 mg | ORAL_CAPSULE | Freq: Once | ORAL | Status: DC

## 2017-11-24 NOTE — MAU Note (Signed)
Pt came to the desk saying she needs to leave and can't wait for results. Wants to be called. Signed an AMA form and left the unit.

## 2017-11-24 NOTE — MAU Note (Addendum)
Pt called wanting her results. I advised her we can't review results over the phone and she should call [facility] for results. Pt wanted to know how far along she was and asked if we were going off her Hcg or measurements of her ultrasound. I advised her that we always go off of her LMP when she is early and may not be able to measure depending on how early she is. I told her measurements are usually more accurate at the first trimester ultrasound that she would have at her appointment. She said she has her first appointment with [facility] next week. Left MAU AMA this morning after ultrasound.

## 2017-11-24 NOTE — MAU Note (Signed)
Pt here from [facility] for [place]. Having cramping and some spotting.

## 2017-11-24 NOTE — ED Triage Notes (Signed)
Pt reports positive home pregnancy test yesterday and is having abd cramping and spotting.

## 2017-11-24 NOTE — ED Provider Notes (Signed)
[facility]PART[facility] Provider Note   CSN: [id number] Arrival date & time: 11/24/17  0326     History   Chief Complaint Chief Complaint  Patient presents with  . Possible Pregnancy    HPI Annette Greene is a 27 y.o. female.  The history is provided by the patient.  Possible Pregnancy  This is a new problem. The current episode started yesterday. The problem occurs constantly. The problem has not changed since onset.Pertinent negatives include no chest pain, no headaches and no shortness of breath. Nothing aggravates the symptoms. Nothing relieves the symptoms. She has tried nothing for the symptoms. The treatment provided no relief.  Abdominal Cramping  This is a new problem. The current episode started yesterday. The problem occurs constantly. Pertinent negatives include no chest pain, no headaches and no shortness of breath. Nothing aggravates the symptoms. Nothing relieves the symptoms. She has tried nothing for the symptoms. The treatment provided no relief.  Patient with a h/o chlamydia and recent trichomonas who is G2P1 LMP 10/13/17 who presents with cramping and positive pregnancy test.  O positive ab negative.  No bleeding but white discharge.    Past Medical History:  Diagnosis Date  . Medical history non-contributory     Patient Active Problem List   Diagnosis Date Noted  . Premature delivery 12/10/2016  . Labor and delivery, indication for care 12/08/2016  . Cervical incompetence 11/24/2016    Past Surgical History:  Procedure Laterality Date  . TONSILLECTOMY    . WISDOM TOOTH EXTRACTION     all 4 teeth     OB History    Gravida  1   Para      Term      Preterm      AB      Living        SAB      TAB      Ectopic      Multiple      Live Births               Home Medications    Prior to Admission medications   Medication Sig Start Date End Date Taking? Authorizing Provider  cetirizine (ZYRTEC ALLERGY) 10 MG  tablet Take 1 tablet (10 mg total) by mouth daily. 07/02/17   Malvin Johns, MD  ibuprofen (ADVIL,MOTRIN) 600 MG tablet Take 1 tablet (600 mg total) by mouth every 6 (six) hours as needed. 12/10/16   Donnel Saxon, CNM  naproxen (NAPROSYN) 500 MG tablet Take 1 tablet (500 mg total) by mouth 2 (two) times daily. 01/04/17   Horton, Barbette Hair, MD  Prenatal Vit-Fe Fumarate-FA (MULTIVITAMIN-PRENATAL) 27-0.8 MG TABS tablet Take 1 tablet by mouth daily.    [provider]    Family History Family History  Problem Relation Age of Onset  . Healthy Mother   . Healthy Father   . Healthy Sister   . Healthy Maternal Grandmother   . Healthy Paternal Grandmother   . Healthy Paternal Grandfather   . Healthy Sister     Social History Social History   Tobacco Use  . Smoking status: Former Smoker    Packs/day: 0.50    Types: Cigarettes    Last attempt to quit: 06/24/2016    Years since quitting: 1.4  . Smokeless tobacco: Never Used  . Tobacco comment: stopped since pregnant  Substance Use Topics  . Alcohol use: Yes    Comment: stopped with pregnancy  . Drug use: No  Allergies   Patient has no known allergies.   Review of Systems Review of Systems  Constitutional: Negative for fatigue and fever.  Respiratory: Negative for shortness of breath.   Cardiovascular: Negative for chest pain.  Gastrointestinal: Negative for vomiting.  Genitourinary: Positive for vaginal discharge. Negative for vaginal bleeding.  Neurological: Negative for headaches.  All other systems reviewed and are negative.    Physical Exam Updated Vital Signs BP 135/63 (BP Location: Left Arm)   Pulse 68   Temp 98.6 F (37 C) (Oral)   Resp 16   Ht 5\' 7"  (1.702 m)   Wt 113.4 kg   LMP 10/13/2017 (Exact Date)   SpO2 100%   BMI 39.16 kg/m   Physical Exam  Constitutional: She is oriented to person, place, and time. She appears well-developed and well-nourished. No distress.  HENT:  Head:  Normocephalic and atraumatic.  Mouth/Throat: No oropharyngeal exudate.  Eyes: Pupils are equal, round, and reactive to light. Conjunctivae are normal.  Neck: Normal range of motion. Neck supple.  Cardiovascular: Normal rate, regular rhythm, normal heart sounds and intact distal pulses.  Pulmonary/Chest: Effort normal and breath sounds normal. No stridor. She has no wheezes. She has no rales.  Abdominal: Soft. Bowel sounds are normal. She exhibits no mass. There is no tenderness. There is no rebound and no guarding.  Genitourinary: Vaginal discharge found.  Genitourinary Comments: No cmt or adnexal tenderness chaperone present  Musculoskeletal: Normal range of motion.  Neurological: She is alert and oriented to person, place, and time. She displays normal reflexes.  Skin: Skin is warm and dry. Capillary refill takes less than 2 seconds.  Psychiatric: She has a normal mood and affect.     ED Treatments / Results  Labs (all labs ordered are listed, but only abnormal results are displayed) Labs Reviewed  URINALYSIS, ROUTINE W REFLEX MICROSCOPIC - Abnormal; Notable for the following components:      Result Value   Hgb urine dipstick TRACE (*)    Ketones, ur 15 (*)    All other components within normal limits  PREGNANCY, URINE - Abnormal; Notable for the following components:   Preg Test, Ur POSITIVE (*)    All other components within normal limits  CBC WITH DIFFERENTIAL/PLATELET - Abnormal; Notable for the following components:   Hemoglobin 10.7 (*)    HCT 32.7 (*)    RDW 16.2 (*)    All other components within normal limits  URINALYSIS, MICROSCOPIC (REFLEX) - Abnormal; Notable for the following components:   Bacteria, UA MANY (*)    All other components within normal limits  WET PREP, GENITAL  BASIC METABOLIC PANEL  HCG, QUANTITATIVE, PREGNANCY  GC/CHLAMYDIA PROBE AMP ([place]) NOT AT [facility]    EKG None  Radiology No results found.  Procedures Procedures (including  critical care time)  Medications Ordered in ED Medications  cefTRIAXone (ROCEPHIN) injection 1 g (has no administration in time range)  azithromycin (ZITHROMAX) powder 1 g (has no administration in time range)    447 am case d/w Dr. Alesia Richards, if HCG > 2000 please transfer to MAU for ultrasound to determine anatomic location of pregnancy  742 case d/w [place] midwife on call who has been made aware of POV transfer  Patient informed of POV transfer.   Final Clinical Impressions(s) / ED Diagnoses   Return for fevers >100.4 unrelieved by medication, shortness of breath, intractable vomiting, or diarrhea, Inability to tolerate liquids or food, cough, altered mental status or any concerns. No signs  of systemic illness or infection. The patient is nontoxic-appearing on exam and vital signs are within normal limits.   I have reviewed the triage vital signs and the nursing notes. Pertinent labs &imaging results that were available during my care of the patient were reviewed by me and considered in my medical decision making (see chart for details).  After history, exam, and medical workup I feel the patient has been appropriately medically screened and is safe for discharge home. Pertinent diagnoses were discussed with the patient. Patient was given return precautions.      Vaughan Garfinkle, MD 11/24/17 0981

## 2017-11-25 LAB — GC/CHLAMYDIA PROBE AMP (~~LOC~~) NOT AT ARMC
Chlamydia: NEGATIVE
Neisseria Gonorrhea: NEGATIVE

## 2017-12-11 DIAGNOSIS — Z8751 Personal history of pre-term labor: Secondary | ICD-10-CM | POA: Insufficient documentation

## 2017-12-11 HISTORY — DX: Personal history of pre-term labor: Z87.51

## 2017-12-20 ENCOUNTER — Other Ambulatory Visit (HOSPITAL_COMMUNITY): Payer: Self-pay | Admitting: Obstetrics and Gynecology

## 2017-12-20 ENCOUNTER — Other Ambulatory Visit: Payer: Self-pay | Admitting: Obstetrics and Gynecology

## 2017-12-23 ENCOUNTER — Encounter (HOSPITAL_COMMUNITY): Payer: Self-pay

## 2017-12-27 ENCOUNTER — Ambulatory Visit (HOSPITAL_COMMUNITY)
Admission: RE | Admit: 2017-12-27 | Discharge: 2017-12-27 | Disposition: A | Discharge status: Home / Self Care | Payer: Managed Care, Other (non HMO) | Source: Ambulatory Visit | Attending: Obstetrics and Gynecology | Admitting: Obstetrics and Gynecology

## 2017-12-27 ENCOUNTER — Other Ambulatory Visit: Payer: Self-pay

## 2017-12-27 ENCOUNTER — Encounter (HOSPITAL_COMMUNITY): Payer: Self-pay

## 2017-12-27 DIAGNOSIS — B009 Herpesviral infection, unspecified: Secondary | ICD-10-CM

## 2017-12-27 DIAGNOSIS — O98511 Other viral diseases complicating pregnancy, first trimester: Secondary | ICD-10-CM | POA: Diagnosis not present

## 2017-12-27 DIAGNOSIS — O0990 Supervision of high risk pregnancy, unspecified, unspecified trimester: Secondary | ICD-10-CM

## 2017-12-27 DIAGNOSIS — O09211 Supervision of pregnancy with history of pre-term labor, first trimester: Secondary | ICD-10-CM

## 2017-12-27 DIAGNOSIS — O98311 Other infections with a predominantly sexual mode of transmission complicating pregnancy, first trimester: Secondary | ICD-10-CM | POA: Diagnosis not present

## 2017-12-27 DIAGNOSIS — O3431 Maternal care for cervical incompetence, first trimester: Secondary | ICD-10-CM | POA: Diagnosis present

## 2017-12-27 DIAGNOSIS — Z3A09 9 weeks gestation of pregnancy: Secondary | ICD-10-CM | POA: Diagnosis not present

## 2017-12-27 DIAGNOSIS — O99211 Obesity complicating pregnancy, first trimester: Secondary | ICD-10-CM | POA: Diagnosis not present

## 2017-12-27 DIAGNOSIS — A6000 Herpesviral infection of urogenital system, unspecified: Secondary | ICD-10-CM | POA: Diagnosis not present

## 2017-12-27 DIAGNOSIS — Z8619 Personal history of other infectious and parasitic diseases: Secondary | ICD-10-CM | POA: Diagnosis present

## 2017-12-27 DIAGNOSIS — O3430 Maternal care for cervical incompetence, unspecified trimester: Secondary | ICD-10-CM | POA: Diagnosis present

## 2017-12-27 NOTE — Consult Note (Signed)
Consultation:   Annette Greene is a 27 yo African American female, G 2 P   LMP 10/09/17  EDC 07/30/18 now  @ 9 2/7 weeks seen in consultation as requested secondary to:  1) Previous premature delivery thought due to an incompetent cervix , thought due to an incompetent cervix @ 24 weeks followed by PTL 2 weeks later    PREVIOUS OBSTETRICAL HISTORY:  1) 2018-  SVD @ 40 weeks, female, 1lb 12oz, thought due to an incompetent cervix @ 24 weeks followed by PTL 2 weeks later     PREVIOUS GYN HISTORY:   Abnormal PAP - neg   GC - neg   Chlamydia - 2017 - treated    Syphilis - neg   CAT - 12 x 38 x 7-10   Contraception - none    PREVIOUS MEDICAL HISTORY:  DM - neg   HTN - neg   Asthma - neg   Thyroid - neg   Rheumatic Fever - neg    Heart - neg   Lung - neg   Liver - neg   Kidney - neg   Epilepsy - neg    TB - neg   Herpes - age 6   UTI - neg        PREVIOUS SURGICAL HISTORY:  None    MEDICATIONS:  Prenatal Vitamins       ALLERGIES/REACTIONS:  None      HABITS:  Smoking - neg   Drinking - neg   Drugs - neg    PSYCHOSOCIAL:  Single; FOB is involved      PROFESSION:  Conservator, museum/gallery    FAMILY HISTORY:  DM - A   HTN - GM   Twins - neg   Stillborns -neg    Birth Defects - neg   Mental Retardation - neg   Blood Dyscrasias - neg   Anesthesia Complications - neg    Genetic - neg          INCOMPETENT CERVIX:  General counseling for cervical incompetence was then performed including its role in second trimester fetal loss.  Cervical incompetence may be congenital or acquired.  Although painless dilatation with rapid passage of the fetus is the classic presentation, it is not the most common presentation.  Pelvic pressure with new onset of vaginal discharge or increased discharge may be the first presenting symptoms.  The role of McDonald cerclage at 14 weeks was discussed including potential benefits and complications.  Although U/S may identify cervical shortening,  in the absence of history compatible with cervical incompetence, it appears that cerclage placement may not be beneficial.  Asymptomatic cervical shortening has recently been associated with intrauterine infection.  ACOG has recommended consideration of offering 17-OH Progesterone to asymptomatic singleton gestations in patients with a cervical length less than 1mm.  The risks and benefits of cerclage placement were discussed.  Multiple first trimester ETOPs without complications are not a risk factor for cervical incompetence.  Previous cryotherapy is more likely to result in cervical stenosis rather than cervical incompetence.  Previous LEEP is considered a risk factor for both incompetent cervix and preterm delivery.   RECURRENT PRETERM LABOR/PRETERM DELIVERY:  General counseling was performed regarding recurrent preterm labor.  The single most reliable risk factor identified is a previous preterm delivery increasing the risk for recurrence to 25%.  The role of serial cervical length examinations and FFN in diagnosing preterm labor risk was discussed.  The role of Celestone in reducing the severity of/preventing  RDS as well as reducing the likelihood of necrotizing enterocolitis and patent ductus arteriosus was discussed.  17-OH Progesterone Caproate weekly was discussed.  Patients who have delivered before 36 weeks due to PROM or PTL are candidates for 17-OH Progesterone Caproate.   HSV: General counseling regarding genital HSV and the risk of fetal infection at the time of delivery was performed.  It is reasonable to offer C/S in this setting if active HSV is present.  The role of prophylaxis beginning @ 36 weeks was discussed. Neonatal herpes simplex virus (HSV) affects approximately 1 in 3,200 live births in the [place] with an estimated incidence of 1,500 annually.  In approximately 50% of these cases, central nervous system or disseminated disease occurs. Thirty percent of these neonates  will die and up to 20% of the remainder will have neurologic sequelae.  The greatest risk for fetal infection occurs with primary genital HSV and vaginal delivery.  This can be greatly reduced by delivery by C/S.  The utility of C/S with recurrent risk HSV is much lower perhaps reducing the risk of fetal infection by only 1-3%.   Due to the high incidence of HSV 1 causing genital lesions, HSV prophylaxis should be considered at 36 weeks as well if a patient has a positive serology and no history of oral lesions. Initial outbreaks may be treated with Valacyclovir (Valtrex) 1000 mg BID x 10 days; recurrent outbreaks may be treated with Valacyclovir (Valtrex) 500 mg BID x 3 days; prophylaxis with Valacyclovir (Valtrex) may be given with 500 mg QD (in immunocompromised or HIV infected patients 1000 mg QD).  OBESITY:  General counseling was then performed regarding obesity.  Thirty percent of the American adults are obese (BMI of 30 or greater) and an additional sixty percent are overweight (BMI 25-29).  59 percent of child bearing women are overweight.  Furthermore, twenty-nine percent are obese and six percent are morbidly obese (BMI of 40 or greater).  Complications specifically related to pregnancy in obese women may include: fetal anomalies, neural tube defects (BMI > 31), fetal macrosomia, gestational diabetes, gestational hypertension, inadequate lactation, increased induction of labor, increased rates of cesarean section, infertility, multifetal gestation, obstructive sleep apnea, operative vaginal delivery, perinatal mortality, placental abruption and previa, PCOD, postpartum and post term hemorrhage, post term pregnancy, pre-eclampsia, preterm labor and delivery, prolonged labor, shoulder dystocia, spontaneous abortion , urinary tract infections and wound infections.  Recommended weight gain in pregnancy for obese women should be limited to 15 lbs. or less.  First trimester/early second trimester  diabetic evaluation should be performed and if negative, followed by re-evaluation at 24-28 weeks.  Serial U/S to evaluate fetal growth and close A-P surveillance in the third trimester is recommended for morbidly obese patients.   ACOG Committee Opinion (# [id number], September 2005) and AJOG published article by S. Chu et al: Maternal Obesity and Risk of Stillbirth: a Meta-analysis September 2007.    IMPRESSIONS:  1) Previous PTD thought due to incompetent cervix and PTL 2) Genital HSV 3) Morbid obesity - BMI - 44.64    RECOMMENDATIONS:  1) 50 gram Glucola now with repeat @ 24-28 weeks if normal 2) Cerclage @ 14 weeks 3) Weekly 17-OH Progesterone Caproate from 16-36 weeks 4) Valacyclovir 500 mg QD beginning @ 36 weeks 5) Serial U/S every 4 weeks for fetal growth 6) Weekly BPP beginning @ 36 weeks 7) Cerclage removal @ 37 weeks             40 minutes spent  in face-to-face consultation with greater than 50% of the time spent in counseling.    Thank you for utilizing our ultrasound and consultative services.  If I may be of any further service, please do not hesitate to contact me.  Sincerely,   Lajoyce Lauber, MD Maternal-Fetal Medicine   Copy of report sent to practitioner/clinic.

## 2018-01-11 ENCOUNTER — Other Ambulatory Visit: Payer: Self-pay | Admitting: Obstetrics and Gynecology

## 2018-01-13 ENCOUNTER — Telehealth (HOSPITAL_COMMUNITY): Payer: Self-pay | Admitting: *Deleted

## 2018-01-13 LAB — OB RESULTS CONSOLE ANTIBODY SCREEN: Antibody Screen: NEGATIVE

## 2018-01-13 LAB — OB RESULTS CONSOLE RUBELLA ANTIBODY, IGM: Rubella: IMMUNE

## 2018-01-13 LAB — OB RESULTS CONSOLE HEPATITIS B SURFACE ANTIGEN: Hepatitis B Surface Ag: NEGATIVE

## 2018-01-13 LAB — CULTURE, OB URINE: Urine Culture, OB: <10000

## 2018-01-13 LAB — OB RESULTS CONSOLE HGB/HCT, BLOOD
HEMATOCRIT: 35 (ref 29–41)
HEMOGLOBIN: 11

## 2018-01-13 LAB — OB RESULTS CONSOLE GC/CHLAMYDIA
Chlamydia: NEGATIVE
Gonorrhea: NEGATIVE

## 2018-01-13 LAB — OB RESULTS CONSOLE ABO/RH: RH TYPE: POSITIVE

## 2018-01-13 LAB — OB RESULTS CONSOLE PLATELET COUNT: Platelets: 270

## 2018-01-13 LAB — SICKLE CELL SCREEN

## 2018-01-13 LAB — OB RESULTS CONSOLE HIV ANTIBODY (ROUTINE TESTING): HIV: NONREACTIVE

## 2018-01-13 LAB — OB RESULTS CONSOLE RPR: RPR: NONREACTIVE

## 2018-01-16 ENCOUNTER — Encounter (HOSPITAL_COMMUNITY): Payer: Self-pay | Admitting: *Deleted

## 2018-01-16 NOTE — Telephone Encounter (Signed)
Preadmission screen  

## 2018-01-24 MED ORDER — SODIUM CHLORIDE 0.9 % IJ SOLN
Freq: Once | INTRAMUSCULAR | Status: DC
  Filled 2018-01-24: qty 1

## 2018-01-25 ENCOUNTER — Encounter (HOSPITAL_COMMUNITY)
Admission: RE | Disposition: A | Discharge status: Home / Self Care | Payer: Self-pay | Source: Ambulatory Visit | Attending: Obstetrics and Gynecology

## 2018-01-25 ENCOUNTER — Inpatient Hospital Stay (HOSPITAL_COMMUNITY): Payer: Managed Care, Other (non HMO) | Admitting: Certified Registered Nurse Anesthetist

## 2018-01-25 ENCOUNTER — Ambulatory Visit (HOSPITAL_COMMUNITY)
Admission: RE | Admit: 2018-01-25 | Discharge: 2018-01-25 | Disposition: A | Discharge status: Home / Self Care | Payer: Managed Care, Other (non HMO) | Source: Ambulatory Visit | Attending: Obstetrics and Gynecology | Admitting: Obstetrics and Gynecology

## 2018-01-25 ENCOUNTER — Encounter (HOSPITAL_COMMUNITY): Payer: Self-pay | Admitting: *Deleted

## 2018-01-25 DIAGNOSIS — Z87891 Personal history of nicotine dependence: Secondary | ICD-10-CM | POA: Diagnosis not present

## 2018-01-25 DIAGNOSIS — O99211 Obesity complicating pregnancy, first trimester: Secondary | ICD-10-CM | POA: Diagnosis not present

## 2018-01-25 DIAGNOSIS — Z3A13 13 weeks gestation of pregnancy: Secondary | ICD-10-CM | POA: Insufficient documentation

## 2018-01-25 DIAGNOSIS — O3431 Maternal care for cervical incompetence, first trimester: Secondary | ICD-10-CM | POA: Diagnosis not present

## 2018-01-25 DIAGNOSIS — O3430 Maternal care for cervical incompetence, unspecified trimester: Secondary | ICD-10-CM | POA: Diagnosis present

## 2018-01-25 HISTORY — PX: CERVICAL CERCLAGE: SHX1329

## 2018-01-25 LAB — CBC
HCT: 34.3 % — ABNORMAL LOW (ref 36.0–46.0)
Hemoglobin: 11.1 g/dL — ABNORMAL LOW (ref 12.0–15.0)
MCH: 27.4 pg (ref 26.0–34.0)
MCHC: 32.4 g/dL (ref 30.0–36.0)
MCV: 84.7 fL (ref 80.0–100.0)
PLATELETS: 256 10*3/uL (ref 150–400)
RBC: 4.05 MIL/uL (ref 3.87–5.11)
RDW: 14.6 % (ref 11.5–15.5)
WBC: 6.7 10*3/uL (ref 4.0–10.5)
nRBC: 0 % (ref 0.0–0.2)

## 2018-01-25 SURGERY — CERCLAGE, CERVIX, VAGINAL APPROACH
Anesthesia: Spinal | Wound class: Clean Contaminated

## 2018-01-25 MED ORDER — SODIUM CHLORIDE 0.9 % IJ SOLN
Freq: Once | INTRAMUSCULAR | Status: DC
  Filled 2018-01-25: qty 1

## 2018-01-25 MED ORDER — ACETAMINOPHEN 500 MG PO TABS
1000.0000 mg | ORAL_TABLET | Freq: Four times a day (QID) | ORAL | Status: DC | PRN
  Administered 2018-01-25: 1000 mg via ORAL
  Filled 2018-01-25: qty 2

## 2018-01-25 MED ORDER — LACTATED RINGERS IV SOLN
INTRAVENOUS | Status: DC | PRN
  Administered 2018-01-25: 11:00:00 via INTRAVENOUS

## 2018-01-25 MED ORDER — BUPIVACAINE IN DEXTROSE 0.75-8.25 % IT SOLN
INTRATHECAL | Status: DC | PRN
  Administered 2018-01-25: 1.2 mL via INTRATHECAL

## 2018-01-25 MED ORDER — ONDANSETRON HCL 4 MG/2ML IJ SOLN
INTRAMUSCULAR | Status: DC | PRN
  Administered 2018-01-25: 4 mg via INTRAVENOUS

## 2018-01-25 MED ORDER — MEPERIDINE HCL 25 MG/ML IJ SOLN: 6.2500 mg | INTRAMUSCULAR | Status: DC | PRN

## 2018-01-25 MED ORDER — FENTANYL CITRATE (PF) 100 MCG/2ML IJ SOLN: 25.0000 ug | INTRAMUSCULAR | Status: DC | PRN

## 2018-01-25 SURGICAL SUPPLY — 19 items
CANISTER SUCT 3000ML PPV (MISCELLANEOUS) ×3 IMPLANT
GLOVE BIO SURGEON STRL SZ7.5 (GLOVE) ×3 IMPLANT
GLOVE BIOGEL PI IND STRL 7.0 (GLOVE) ×1 IMPLANT
GLOVE BIOGEL PI IND STRL 7.5 (GLOVE) ×1 IMPLANT
GLOVE BIOGEL PI INDICATOR 7.0 (GLOVE) ×2
GLOVE BIOGEL PI INDICATOR 7.5 (GLOVE) ×2
GOWN STRL REUS W/TWL LRG LVL3 (GOWN DISPOSABLE) ×6 IMPLANT
NS IRRIG 1000ML POUR BTL (IV SOLUTION) ×3 IMPLANT
PACK VAGINAL MINOR WOMEN LF (CUSTOM PROCEDURE TRAY) ×3 IMPLANT
PAD OB MATERNITY 4.3X12.25 (PERSONAL CARE ITEMS) ×3 IMPLANT
PAD PREP 24X48 CUFFED NSTRL (MISCELLANEOUS) ×3 IMPLANT
SUT MERSILENE 5MM BP 1 12 (SUTURE) ×3 IMPLANT
SUT PROLENE 1 CT 1 30 (SUTURE) ×3 IMPLANT
SYR BULB IRRIGATION 50ML (SYRINGE) ×3 IMPLANT
TOWEL OR 17X24 6PK STRL BLUE (TOWEL DISPOSABLE) ×3 IMPLANT
TRAY FOLEY W/BAG SLVR 14FR (SET/KITS/TRAYS/PACK) ×3 IMPLANT
TUBING NON-CON 1/4 X 20 CONN (TUBING) IMPLANT
TUBING NON-CON 1/4 X 20' CONN (TUBING)
YANKAUER SUCT BULB TIP NO VENT (SUCTIONS) IMPLANT

## 2018-01-25 NOTE — Discharge Summary (Signed)
Physician Discharge Summary  Patient ID: Annette Greene MRN: [id number] DOB/AGE: 1990/08/30 27 y.o.  Admit date: 01/25/2018 Discharge date: 01/25/2018  Admission Diagnoses: Incompetent Cervix Discharge Diagnoses:  Active Problems:   Incompetent cervix in pregnancy, antepartum   Discharged Condition: good  Hospital Course: Patient admitted for cervical cerclage which was placed without difficulty.  Post procedure instructions given. FHTs 130 post procedure.  Consults: None  Significant Diagnostic Studies: none  Treatments: IV hydration and observation  Discharge Exam: Blood pressure 120/64, pulse 70, temperature 98.1 F (36.7 C), resp. rate 20, height 5\' 7"  (1.702 m), weight 128.4 kg, last menstrual period 10/13/2017, SpO2 100 %, unknown if currently breastfeeding. General appearance: alert and no distress Resp: clear to auscultation bilaterally Cardio: regular rate and rhythm Pelvic: cervix normal in appearance, external genitalia normal, no cervical motion tenderness, uterus normal size, shape, and consistency, vagina normal without discharge and cervix closed Extremities: no calf tenderness  Disposition: Discharge disposition: 01-Home or Self Care        Allergies as of 01/25/2018   No Known Allergies     Medication List    STOP taking these medications   terconazole 80 MG vaginal suppository Commonly known as:  TERAZOL 3     TAKE these medications   multivitamin-prenatal 27-0.8 MG Tabs tablet Take 1 tablet by mouth daily.      Follow-up Information    Everett Graff, MD On 01/30/2018.   Specialty:  Obstetrics and Gynecology Why:  Keep appt already scheduled on 01/30/18 Contact information: [place] [address] [phone]           Signed: [name] 01/25/2018, 1:16 PM

## 2018-01-25 NOTE — Interval H&P Note (Signed)
History and Physical Interval Note:  01/25/2018 11:04 AM  Annette Greene  has presented today for surgery, with the diagnosis of Incompetent Cervix  The various methods of treatment have been discussed with the patient and family. After consideration of risks, benefits and other options for treatment, the patient has consented to  Procedure(s): CERCLAGE CERVICAL (N/A) as a surgical intervention .  The patient's history has been reviewed, patient examined, no change in status, stable for surgery.  I have reviewed the patient's chart and labs.  Questions were answered to the patient's satisfaction.     [name]

## 2018-01-25 NOTE — H&P (Signed)
Annette Greene is an 27 y.o. female, G2 P41 @ 13.[redacted] weeks gestation presenting to LD for cervical cerclage by Dr Mancel Bale.  H/O cervical incompetency & PTD @ 26 weeks. Pt endorses feeling nausea and hungry at this point, but denies V/D/Fever/CXT/No leakage or bleeding/CP/SOB. Denies recent travels. Pt endorses HSV+, but denies any prodrome s/sx or active lesions, not currently on Valtrex.  Pregnancy Problems history of premature delivery (26 4/7 weeks, admitted with cervical incompetence at 24 weeks, onset of labor at 51 4/7 weeks. Recommend cerclage in future pregnancies. MFM consult--recommended cerclage and 17P.) maternal obesity complicating pregnancy, childbirth and the puerperium, antepartum (BMI 44, antenatal testing at 32 weeks.) cervical incompetence (11/2016: Admitted at 24 weeks with cervical incompetence. Delivered spontaneously at 26 4/7 weeks. 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 Continue to follow.) Herpes simplex (Hx HSV 2--Valtrex at 36 weeks)  Medications Prenatal Vitamin terconazole.   Patient Active Problem List   Diagnosis Date Noted  . Obesity, morbid ([place]) 12/27/2017  . History of herpes simplex type 2 infection 12/27/2017  . History of preterm labor, current pregnancy, first trimester 12/27/2017  . Premature delivery 12/10/2016  . Labor and delivery, indication for care 12/08/2016  . Incompetent cervix in pregnancy, antepartum 11/24/2016   Pertinent Gynecological History: Menses: flow is moderate, regular every month with spotting approximately 38 days per month and with minimal cramping Bleeding: moderate and 7 days of bleeding Contraception: none Sexually transmitted diseases: past history: chlamydia in 2017 and [place].  Previous GYN Procedures: None  Last mammogram: Never  Last pap: normal Date: 2018 OB History: G2, P0101  currenlty   MEDICAL/FAMILY/SOCIAL HX: Patient's last menstrual period was 10/13/2017 (exact date).    Past Medical History:  Diagnosis Date  . Hx of chlamydia infection   . Medical history non-contributory     Past Surgical History:  Procedure Laterality Date  . TONSILLECTOMY    . TONSILLECTOMY AND ADENOIDECTOMY    . WISDOM TOOTH EXTRACTION     all 4 teeth    Family History  Problem Relation Age of Onset  . Healthy Mother   . Hypertension Mother   . Healthy Father   . Healthy Sister   . Healthy Maternal Grandmother   . Hypertension Maternal Grandmother   . Healthy Paternal Grandmother   . Healthy Paternal Grandfather   . Healthy Sister   . Drug abuse Maternal Aunt   . Hypertension Maternal Aunt     Social History:  reports that she quit smoking about 19 months ago. Her smoking use included cigarettes. She smoked 0.50 packs per day. She has never used smokeless tobacco. She reports that she drank alcohol. She reports that she does not use drugs.  ALLERGIES/MEDS:  Allergies: No Known Allergies  Medications Prior to Admission  Medication Sig Dispense Refill Last Dose  . Prenatal Vit-Fe Fumarate-FA (MULTIVITAMIN-PRENATAL) 27-0.8 MG TABS tablet Take 1 tablet by mouth daily.   Taking     Review of Systems  All other systems reviewed and are negative.   Last menstrual period 10/13/2017, unknown if currently breastfeeding. Physical Exam  Nursing note and vitals reviewed. Constitutional: She is oriented to person, place, and time. She appears well-developed and well-nourished.  HENT:  Head: Normocephalic and atraumatic.  Eyes: Pupils are equal, round, and reactive to light. Conjunctivae are normal.  Neck: Normal range of motion. Neck supple.  Cardiovascular: Normal rate and  regular rhythm.  Respiratory: Effort normal and breath sounds normal.  GI: Soft. Bowel sounds are normal.  Musculoskeletal: Normal range of motion.  Neurological: She is alert and oriented to  person, place, and time.  Skin: Skin is warm and dry.  Psychiatric: She has a normal mood and affect.    No results found for this or any previous visit (from the past 24 hour(s)).  No results found.   Last Cervical length [place] 01/03/2018:    ASSESSMENT: Annette Greene is an 27 y.o. female, G2 P49 @ 13.[redacted] weeks gestation presenting to LD for cervical cerclage by Dr Mancel Bale.  H/O cervical incompetency & PTD @ 26 weeks. Pt has h/o hsv no prodrome s/sx or active lesions per pt now. Last cervical length measured at 3.9cm on 10/15. Pt in room and stable.  PLAN: Admitted by Dr Mancel Bale to birthing suits. Routine cerclage orders. Placement of cervical cerclage under spinal anesthesia today.    Rael Yo CNM 01/25/2018, 10:03 AM

## 2018-01-25 NOTE — Op Note (Signed)
Preop Diagnosis: Incompetent Cervix   Postop Diagnosis: Incompetent Cervix   Procedure: CERCLAGE CERVICAL   Anesthesia: Choice   Anesthesiologist: Lyn Hollingshead, MD   Attending: Everett Graff, MD   Assistant: N/a  Findings: Closed and long cervix  Pathology: N/a  Fluids: 700 cc  UOP: 100 cc  EBL: 10 cc  Complications: None  Procedure:The patient was taken to the operating room after the risks, benefits and alternatives discussed with the patient and consent signed and witnessed.  The patient was given a spinal per anesthesia and placed in the dorsal lithotomy position.  The patient was prepped and draped in the usual sterile fashion.  A TIME OUT was performed and the pt identified as Annette Greene.  A cervical cerclage stitch was placed using Mersilene and the knot was tied anteriorly on the cervix.  Clindamycin douche was performed.  Membranes remained intact and post procedure fetal heart rate was 130.  Sponge, lap and needle count was correct and the patient was transferred to the recovery room in good condition.

## 2018-01-25 NOTE — Progress Notes (Signed)
RN called and reported pt having some cramping, in which she would like pain meds for. Dr Mancel Bale was consulted and okay'ed tylenol to be given. Pt may have motrin 600mg  if tylenol does not help. Rn reports pt stable.

## 2018-01-25 NOTE — Transfer of Care (Signed)
Immediate Anesthesia Transfer of Care Note  Patient: Annette Greene  Procedure(s) Performed: CERCLAGE CERVICAL (N/A )  Patient Location: PACU  Anesthesia Type:Spinal  Level of Consciousness: awake, alert  and oriented  Airway & Oxygen Therapy: Patient Spontanous Breathing  Post-op Assessment: Report given to RN and Post -op Vital signs reviewed and stable  Post vital signs: Reviewed and stable  Last Vitals:  Vitals Value Taken Time  BP    Temp    Pulse    Resp    SpO2      Last Pain:  Vitals:   01/25/18 1320  TempSrc:   PainSc: 0-No pain      Patients Stated Pain Goal: 4 (78/58/85 0277)  Complications: No apparent anesthesia complications

## 2018-01-25 NOTE — Anesthesia Procedure Notes (Signed)
Spinal  Patient location during procedure: OR Start time: 01/25/2018 11:14 AM End time: 01/25/2018 11:18 AM Staffing Anesthesiologist: Lyn Hollingshead, MD Performed: anesthesiologist  Preanesthetic Checklist Completed: patient identified, site marked, surgical consent, pre-op evaluation, timeout performed, IV checked, risks and benefits discussed and monitors and equipment checked Spinal Block Patient position: sitting Prep: site prepped and draped and DuraPrep Patient monitoring: continuous pulse ox and blood pressure Approach: midline Location: L3-4 Injection technique: single-shot Needle Needle type: Pencan  Needle gauge: 24 G Needle length: 10 cm Needle insertion depth: 7 cm Assessment Sensory level: T10

## 2018-01-25 NOTE — Anesthesia Preprocedure Evaluation (Signed)
Anesthesia Evaluation  Patient identified by MRN, date of birth, ID band Patient awake    Reviewed: Allergy & Precautions, H&P , NPO status , Patient's Chart, lab work & pertinent test results  Airway Mallampati: III  TM Distance: >3 FB Neck ROM: full    Dental no notable dental hx. (+) Teeth Intact   Pulmonary neg pulmonary ROS, former smoker,    Pulmonary exam normal breath sounds clear to auscultation       Cardiovascular negative cardio ROS   Rhythm:regular Rate:Normal     Neuro/Psych negative neurological ROS  negative psych ROS   GI/Hepatic negative GI ROS, Neg liver ROS,   Endo/Other  Morbid obesity  Renal/GU negative Renal ROS  negative genitourinary   Musculoskeletal   Abdominal (+) + obese,   Peds  Hematology  (+) Blood dyscrasia, anemia ,   Anesthesia Other Findings   Reproductive/Obstetrics (+) Pregnancy                             Anesthesia Physical Anesthesia Plan  ASA: III  Anesthesia Plan: Spinal   Post-op Pain Management:    Induction:   PONV Risk Score and Plan:   Airway Management Planned: Natural Airway and Nasal Cannula  Additional Equipment:   Intra-op Plan:   Post-operative Plan:   Informed Consent: I have reviewed the patients History and Physical, chart, labs and discussed the procedure including the risks, benefits and alternatives for the proposed anesthesia with the patient or authorized representative who has indicated his/her understanding and acceptance.     Plan Discussed with: CRNA and Surgeon  Anesthesia Plan Comments:         Anesthesia Quick Evaluation

## 2018-01-30 NOTE — Anesthesia Postprocedure Evaluation (Signed)
Anesthesia Post Note  Patient: Annette Greene  Procedure(s) Performed: CERCLAGE CERVICAL (N/A )     Patient location during evaluation: PACU Anesthesia Type: Spinal Level of consciousness: awake Pain management: pain level controlled Vital Signs Assessment: post-procedure vital signs reviewed and stable Respiratory status: spontaneous breathing Cardiovascular status: stable Postop Assessment: no headache, no backache, spinal receding, patient able to bend at knees and no apparent nausea or vomiting Anesthetic complications: no    Last Vitals:  Vitals:   01/25/18 1615 01/25/18 1625  BP: 118/67 118/67  Pulse:  85  Resp: 18 18  Temp: 37.1 C 37.1 C  SpO2:  100%    Last Pain:  Vitals:   01/25/18 1625  TempSrc: Oral  PainSc:    Pain Goal: Patients Stated Pain Goal: 2 (01/25/18 1452)               Huston Foley

## 2018-02-06 ENCOUNTER — Other Ambulatory Visit: Payer: Self-pay

## 2018-02-06 ENCOUNTER — Encounter (HOSPITAL_COMMUNITY): Payer: Self-pay

## 2018-02-06 ENCOUNTER — Inpatient Hospital Stay (HOSPITAL_COMMUNITY)
Admission: AD | Admit: 2018-02-06 | Discharge: 2018-02-06 | Disposition: A | Discharge status: Home / Self Care | Payer: Managed Care, Other (non HMO) | Source: Ambulatory Visit | Attending: Obstetrics and Gynecology | Admitting: Obstetrics and Gynecology

## 2018-02-06 DIAGNOSIS — Z87891 Personal history of nicotine dependence: Secondary | ICD-10-CM | POA: Diagnosis not present

## 2018-02-06 DIAGNOSIS — Z3A15 15 weeks gestation of pregnancy: Secondary | ICD-10-CM | POA: Diagnosis not present

## 2018-02-06 DIAGNOSIS — R109 Unspecified abdominal pain: Secondary | ICD-10-CM | POA: Insufficient documentation

## 2018-02-06 DIAGNOSIS — O26892 Other specified pregnancy related conditions, second trimester: Secondary | ICD-10-CM | POA: Diagnosis not present

## 2018-02-06 DIAGNOSIS — O3432 Maternal care for cervical incompetence, second trimester: Secondary | ICD-10-CM | POA: Diagnosis not present

## 2018-02-06 DIAGNOSIS — O26899 Other specified pregnancy related conditions, unspecified trimester: Secondary | ICD-10-CM | POA: Diagnosis present

## 2018-02-06 DIAGNOSIS — O3430 Maternal care for cervical incompetence, unspecified trimester: Secondary | ICD-10-CM | POA: Diagnosis present

## 2018-02-06 LAB — URINALYSIS, ROUTINE W REFLEX MICROSCOPIC
BACTERIA UA: NONE SEEN
Bilirubin Urine: NEGATIVE
Glucose, UA: NEGATIVE mg/dL
Hgb urine dipstick: NEGATIVE
KETONES UR: NEGATIVE mg/dL
Leukocytes, UA: NEGATIVE
Nitrite: NEGATIVE
PH: 6 (ref 5.0–8.0)
Protein, ur: 30 mg/dL — AB
Specific Gravity, Urine: 1.028 (ref 1.005–1.030)

## 2018-02-06 LAB — WET PREP, GENITAL
Clue Cells Wet Prep HPF POC: NONE SEEN
SPERM: NONE SEEN
Trich, Wet Prep: NONE SEEN
YEAST WET PREP: NONE SEEN

## 2018-02-06 NOTE — Discharge Instructions (Signed)
Please call OB if your abdominal pain returns. Stay well-hydrated with at least 2-3 pitchers of water everyday (the pitcher you got in MAU).

## 2018-02-06 NOTE — MAU Note (Signed)
Pt presents to MAU with complaints of abdominal cramping since Saturday and states she is having abdominal pressure today. PT denies any VB

## 2018-02-06 NOTE — MAU Provider Note (Signed)
History     CSN: [id number]  Arrival date and time: 02/06/18 1205   First Provider Initiated Contact with Patient 02/06/18 1343      Chief Complaint  Patient presents with  . Abdominal Pain   HPI  Ms.  Annette Greene is a 27 y.o. year old G58P0101 female at [redacted]w[redacted]d weeks gestation who presents to MAU reporting abdominal cramping since Saturday 02/04/18 and abdominal pressure today. She reports the abdominal cramping started while she was dry heaving. So she decided to start drinking water. She reports drinking and resting all day 02/05/18 trying to alleviate the cramping. She denies VB or LOF. She has a H/O PTB @ 26 wks and a cervical cerclage that was placed on 01/25/18. She has not had SI since before the cerclage was placed.  Past Medical History:  Diagnosis Date  . Hx of chlamydia infection   . Medical history non-contributory     Past Surgical History:  Procedure Laterality Date  . CERVICAL CERCLAGE N/A 01/25/2018   Procedure: CERCLAGE CERVICAL;  Surgeon: Everett Graff, MD;  Location: [place];  Service: Gynecology;  Laterality: N/A;  . TONSILLECTOMY    . TONSILLECTOMY AND ADENOIDECTOMY    . WISDOM TOOTH EXTRACTION     all 4 teeth    Family History  Problem Relation Age of Onset  . Healthy Mother   . Hypertension Mother   . Healthy Father   . Healthy Sister   . Healthy Maternal Grandmother   . Hypertension Maternal Grandmother   . Healthy Paternal Grandmother   . Healthy Paternal Grandfather   . Healthy Sister   . Drug abuse Maternal Aunt   . Hypertension Maternal Aunt     Social History   Tobacco Use  . Smoking status: Former Smoker    Packs/day: 0.50    Types: Cigarettes    Last attempt to quit: 06/24/2016    Years since quitting: 1.6  . Smokeless tobacco: Never Used  . Tobacco comment: stopped since pregnant  Substance Use Topics  . Alcohol use: Not Currently    Comment: stopped with pregnancy  . Drug use: No    Allergies: No Known  Allergies  Medications Prior to Admission  Medication Sig Dispense Refill Last Dose  . Prenatal Vit-Fe Fumarate-FA (MULTIVITAMIN-PRENATAL) 27-0.8 MG TABS tablet Take 1 tablet by mouth daily.   Taking    Review of Systems  Constitutional: Negative.   HENT: Negative.   Eyes: Negative.   Respiratory: Negative.   Cardiovascular: Negative.   Gastrointestinal: Positive for abdominal pain (cramping since 11/16 ).  Endocrine: Negative.   Genitourinary: Negative.   Musculoskeletal: Negative.   Skin: Negative.   Allergic/Immunologic: Negative.   Neurological: Negative.   Hematological: Negative.   Psychiatric/Behavioral: Negative.    Physical Exam   Blood pressure 129/69, pulse 89, temperature 98 F (36.7 C), resp. rate 16, weight 131.1 kg, last menstrual period 10/13/2017.  Physical Exam  Nursing note and vitals reviewed. Constitutional: She is oriented to person, place, and time. She appears well-developed and well-nourished.  HENT:  Head: Normocephalic and atraumatic.  Eyes: Pupils are equal, round, and reactive to light.  Neck: Normal range of motion.  Cardiovascular: Normal rate and regular rhythm.  Respiratory: Effort normal and breath sounds normal.  GI: Soft.  Genitourinary:  Genitourinary Comments: Uterus: non-tender, S=D, SE: cervix is smooth, pink, no lesions, small amt of thick, chunky, white vaginal d/c -- WP, GC/CT done, closed/long/firm; cerclage intact without tension, no CMT or friability,  no adnexal tenderness   Musculoskeletal: Normal range of motion.  Neurological: She is alert and oriented to person, place, and time.  Skin: Skin is warm and dry.  Psychiatric: She has a normal mood and affect. Her behavior is normal. Judgment and thought content normal.    MAU Course  Procedures  MDM CCUA Wet Prep PO hydration -- "feels so much better since drinking the water" FHTs by doppler: 154 bpm   *Consult with Dr. Harolyn Rutherford - notified of patient's complaints,  assessments, & lab results - ok to d/c home  Results for orders placed or performed during the hospital encounter of 02/06/18 (from the past 24 hour(s))  Urinalysis, Routine w reflex microscopic     Status: Abnormal   Collection Time: 02/06/18  1:03 PM  Result Value Ref Range   Color, Urine YELLOW YELLOW   APPearance CLEAR CLEAR   Specific Gravity, Urine 1.028 1.005 - 1.030   pH 6.0 5.0 - 8.0   Glucose, UA NEGATIVE NEGATIVE mg/dL   Hgb urine dipstick NEGATIVE NEGATIVE   Bilirubin Urine NEGATIVE NEGATIVE   Ketones, ur NEGATIVE NEGATIVE mg/dL   Protein, ur 30 (A) NEGATIVE mg/dL   Nitrite NEGATIVE NEGATIVE   Leukocytes, UA NEGATIVE NEGATIVE   RBC / HPF 0-5 0 - 5 RBC/hpf   WBC, UA 0-5 0 - 5 WBC/hpf   Bacteria, UA NONE SEEN NONE SEEN   Squamous Epithelial / LPF 0-5 0 - 5   Mucus PRESENT   Wet prep, genital     Status: Abnormal   Collection Time: 02/06/18  2:15 PM  Result Value Ref Range   Yeast Wet Prep HPF POC NONE SEEN NONE SEEN   Trich, Wet Prep NONE SEEN NONE SEEN   Clue Cells Wet Prep HPF POC NONE SEEN NONE SEEN   WBC, Wet Prep HPF POC MODERATE (A) NONE SEEN   Sperm NONE SEEN     Assessment and Plan  Incompetent cervix in pregnancy, antepartum - Reassurance given of cerclage being intact and not under tension - Advised to call OB office with any VB or LOF  Abdominal pain affecting pregnancy  - Ok to take Tylenol 1000 mg po every 6 hrs prn pain - Discharge patient - Keep scheduled appt with [facility] - Patient verbalized an understanding of the plan of care and agrees.     Laury Deep, MSN, CNM 02/06/2018, 1:43 PM

## 2018-03-17 ENCOUNTER — Other Ambulatory Visit: Payer: Self-pay

## 2018-03-17 ENCOUNTER — Encounter (HOSPITAL_COMMUNITY): Payer: Self-pay | Admitting: *Deleted

## 2018-03-17 ENCOUNTER — Inpatient Hospital Stay (HOSPITAL_COMMUNITY)
Admission: AD | Admit: 2018-03-17 | Discharge: 2018-03-17 | Disposition: A | Discharge status: Home / Self Care | Payer: Managed Care, Other (non HMO) | Attending: Obstetrics & Gynecology | Admitting: Obstetrics & Gynecology

## 2018-03-17 DIAGNOSIS — O23592 Infection of other part of genital tract in pregnancy, second trimester: Secondary | ICD-10-CM | POA: Diagnosis not present

## 2018-03-17 DIAGNOSIS — O3432 Maternal care for cervical incompetence, second trimester: Secondary | ICD-10-CM

## 2018-03-17 DIAGNOSIS — B9689 Other specified bacterial agents as the cause of diseases classified elsewhere: Secondary | ICD-10-CM | POA: Diagnosis not present

## 2018-03-17 DIAGNOSIS — Z87891 Personal history of nicotine dependence: Secondary | ICD-10-CM | POA: Insufficient documentation

## 2018-03-17 DIAGNOSIS — Z3A20 20 weeks gestation of pregnancy: Secondary | ICD-10-CM

## 2018-03-17 DIAGNOSIS — Z3492 Encounter for supervision of normal pregnancy, unspecified, second trimester: Secondary | ICD-10-CM

## 2018-03-17 DIAGNOSIS — R109 Unspecified abdominal pain: Secondary | ICD-10-CM | POA: Diagnosis present

## 2018-03-17 HISTORY — DX: Unspecified infectious disease: B99.9

## 2018-03-17 LAB — WET PREP, GENITAL
SPERM: NONE SEEN
TRICH WET PREP: NONE SEEN
YEAST WET PREP: NONE SEEN

## 2018-03-17 LAB — URINALYSIS, ROUTINE W REFLEX MICROSCOPIC
BILIRUBIN URINE: NEGATIVE
Glucose, UA: NEGATIVE mg/dL
Hgb urine dipstick: NEGATIVE
Ketones, ur: NEGATIVE mg/dL
LEUKOCYTES UA: NEGATIVE
NITRITE: NEGATIVE
PH: 7 (ref 5.0–8.0)
Protein, ur: NEGATIVE mg/dL
SPECIFIC GRAVITY, URINE: 1.027 (ref 1.005–1.030)

## 2018-03-17 MED ORDER — METRONIDAZOLE 500 MG PO TABS: 500.0000 mg | ORAL_TABLET | Freq: Two times a day (BID) | ORAL | 0 refills | Status: DC

## 2018-03-17 NOTE — MAU Provider Note (Signed)
History     CSN: [id number]  Arrival date and time: 03/17/18 1336   First Provider Initiated Contact with Patient 03/17/18 1410      Chief Complaint  Patient presents with  . Abdominal Pain  . Pelvic Pain  . Arm Pain   HPI Annette Greene is a 27 y.o. G2P0101 at [redacted]w[redacted]d who presents to MAU for evaluation of cervical cerclage placed 01/25/18. Patient states as her baby grows she can feel increasing pressure in her abdomen and pelvis and is would like to have the presence of her cerclage suture confirmed. She denies vaginal bleeding, leaking of fluid, decreased fetal movement, fever, falls, or recent illness.    Patient has changed prenatal care Providers. She has her first appointment with [facility] 03/30/2018  OB History    Gravida  2   Para  1   Term  0   Preterm  1   AB      Living  1     SAB      TAB      Ectopic      Multiple      Live Births  1           Past Medical History:  Diagnosis Date  . Hx of chlamydia infection   . Infection    UTI  . Medical history non-contributory   . Preterm labor     Past Surgical History:  Procedure Laterality Date  . CERVICAL CERCLAGE N/A 01/25/2018   Procedure: CERCLAGE CERVICAL;  Surgeon: Everett Graff, MD;  Location: [place];  Service: Gynecology;  Laterality: N/A;  . TONSILLECTOMY    . TONSILLECTOMY AND ADENOIDECTOMY    . WISDOM TOOTH EXTRACTION     all 4 teeth    Family History  Problem Relation Age of Onset  . Healthy Mother   . Hypertension Mother   . Healthy Father   . Healthy Sister   . Healthy Maternal Grandmother   . Hypertension Maternal Grandmother   . Healthy Paternal Grandmother   . Healthy Paternal Grandfather   . Healthy Sister   . Drug abuse Maternal Aunt   . Hypertension Maternal Aunt     Social History   Tobacco Use  . Smoking status: Former Smoker    Packs/day: 0.50    Types: Cigarettes    Last attempt to quit: 06/24/2016    Years since  quitting: 1.7  . Smokeless tobacco: Never Used  . Tobacco comment: stopped since pregnant  Substance Use Topics  . Alcohol use: Not Currently    Comment: stopped with pregnancy  . Drug use: No    Allergies: No Known Allergies  Medications Prior to Admission  Medication Sig Dispense Refill Last Dose  . Prenatal Vit-Fe Fumarate-FA (MULTIVITAMIN-PRENATAL) 27-0.8 MG TABS tablet Take 1 tablet by mouth daily.   Taking    Review of Systems  Constitutional: Negative for chills, fatigue and fever.  Respiratory: Negative for shortness of breath.   Gastrointestinal: Negative for abdominal pain.  Genitourinary: Positive for vaginal discharge. Negative for difficulty urinating, flank pain, vaginal bleeding and vaginal pain.       Patient endorses recurrent yeast infections this pregnancy  Neurological: Negative for headaches.  All other systems reviewed and are negative.  Physical Exam   Blood pressure 127/60, pulse 88, temperature 98.3 F (36.8 C), temperature source Oral, resp. rate 18, weight 131.3 kg, last menstrual period 10/13/2017, unknown if currently breastfeeding.  Physical Exam  Nursing  note and vitals reviewed. Constitutional: She is oriented to person, place, and time. She appears well-developed and well-nourished.  Respiratory: Effort normal.  GI: She exhibits no distension. There is no abdominal tenderness. There is no rebound and no guarding.  Genitourinary:    Genitourinary Comments: Cerclage suture visualized Closed cervix Thick white vaginal discharge visible, not foul-smelling   Neurological: She is alert and oriented to person, place, and time.  Skin: Skin is warm and dry.  Psychiatric: She has a normal mood and affect. Her behavior is normal. Thought content normal.    MAU Course/MDM   --Cerclage in place --No concerning findings  Patient Vitals for the past 24 hrs:  BP Temp Temp src Pulse Resp Weight  03/17/18 1355 127/60 98.3 F (36.8 C) Oral 88 18 131.3  kg   Results for orders placed or performed during the hospital encounter of 03/17/18 (from the past 24 hour(s))  Urinalysis, Routine w reflex microscopic     Status: Abnormal   Collection Time: 03/17/18  1:52 PM  Result Value Ref Range   Color, Urine YELLOW YELLOW   APPearance HAZY (A) CLEAR   Specific Gravity, Urine 1.027 1.005 - 1.030   pH 7.0 5.0 - 8.0   Glucose, UA NEGATIVE NEGATIVE mg/dL   Hgb urine dipstick NEGATIVE NEGATIVE   Bilirubin Urine NEGATIVE NEGATIVE   Ketones, ur NEGATIVE NEGATIVE mg/dL   Protein, ur NEGATIVE NEGATIVE mg/dL   Nitrite NEGATIVE NEGATIVE   Leukocytes, UA NEGATIVE NEGATIVE  Wet prep, genital     Status: Abnormal   Collection Time: 03/17/18  2:16 PM  Result Value Ref Range   Yeast Wet Prep HPF POC NONE SEEN NONE SEEN   Trich, Wet Prep NONE SEEN NONE SEEN   Clue Cells Wet Prep HPF POC PRESENT (A) NONE SEEN   WBC, Wet Prep HPF POC FEW (A) NONE SEEN   Sperm NONE SEEN     Meds ordered this encounter  Medications  . metroNIDAZOLE (FLAGYL) 500 MG tablet    Sig: Take 1 tablet (500 mg total) by mouth 2 (two) times daily.    Dispense:  14 tablet    Refill:  0    Order Specific Question:   Supervising Provider    Answer:   Donnamae Jude [[id number]]      Assessment and Plan  --27 y.o. G2P0101 at [redacted]w[redacted]d  --FHT 161 by Doppler --Bacterial Vaginosis, rx to pharmacy --Discharge home in stable condition  F/U: [facility] [place] 03/30/2018  Darlina Rumpf, CNM 03/17/2018, 2:37 PM

## 2018-03-17 NOTE — Discharge Instructions (Signed)
Vaginal Yeast infection, Adult    Vaginal yeast infection is a condition that causes vaginal discharge as well as soreness, swelling, and redness (inflammation) of the vagina. This is a common condition. Some women get this infection frequently.  What are the causes?  This condition is caused by a change in the normal balance of the yeast (candida) and bacteria that live in the vagina. This change causes an overgrowth of yeast, which causes the inflammation.  What increases the risk?  The condition is more likely to develop in women who:   Take antibiotic medicines.   Have diabetes.   Take birth control pills.   Are pregnant.   Douche often.   Have a weak body defense system (immune system).   Have been taking steroid medicines for a long time.   Frequently wear tight clothing.  What are the signs or symptoms?  Symptoms of this condition include:   White, thick, creamy vaginal discharge.   Swelling, itching, redness, and irritation of the vagina. The lips of the vagina (vulva) may be affected as well.   Pain or a burning feeling while urinating.   Pain during sex.  How is this diagnosed?  This condition is diagnosed based on:   Your medical history.   A physical exam.   A pelvic exam. Your health care provider will examine a sample of your vaginal discharge under a microscope. Your health care provider may send this sample for testing to confirm the diagnosis.  How is this treated?  This condition is treated with medicine. Medicines may be over-the-counter or prescription. You may be told to use one or more of the following:   Medicine that is taken by mouth (orally).   Medicine that is applied as a cream (topically).   Medicine that is inserted directly into the vagina (suppository).  Follow these instructions at home:    Lifestyle   Do not have sex until your health care provider approves. Tell your sex partner that you have a yeast infection. That person should go to his or her health care  provider and ask if they should also be treated.   Do not wear tight clothes, such as pantyhose or tight pants.   Wear breathable cotton underwear.  General instructions   Take or apply over-the-counter and prescription medicines only as told by your health care provider.   Eat more yogurt. This may help to keep your yeast infection from returning.   Do not use tampons until your health care provider approves.   Try taking a sitz bath to help with discomfort. This is a warm water bath that is taken while you are sitting down. The water should only come up to your hips and should cover your buttocks. Do this 3-4 times per day or as told by your health care provider.   Do not douche.   If you have diabetes, keep your blood sugar levels under control.   Keep all follow-up visits as told by your health care provider. This is important.  Contact a health care provider if:   You have a fever.   Your symptoms go away and then return.   Your symptoms do not get better with treatment.   Your symptoms get worse.   You have new symptoms.   You develop blisters in or around your vagina.   You have blood coming from your vagina and it is not your menstrual period.   You develop pain in your abdomen.  Summary     Vaginal yeast infection is a condition that causes discharge as well as soreness, swelling, and redness (inflammation) of the vagina.   This condition is treated with medicine. Medicines may be over-the-counter or prescription.   Take or apply over-the-counter and prescription medicines only as told by your health care provider.   Do not douche. Do not have sex or use tampons until your health care provider approves.   Contact a health care provider if your symptoms do not get better with treatment or your symptoms go away and then return.  This information is not intended to replace advice given to you by your health care provider. Make sure you discuss any questions you have with your health care  provider.  Document Released: 12/16/2004 Document Revised: 07/25/2017 Document Reviewed: 07/25/2017  Elsevier Interactive Patient Education  2019 [employer]

## 2018-03-17 NOTE — MAU Note (Addendum)
Cramping in lower abd and pressure in pelvis for past week and a half, hips are aching. No bleeding or leaking.  Also wanting injection site checked, some irritation at site

## 2018-03-20 LAB — GC/CHLAMYDIA PROBE AMP (~~LOC~~) NOT AT ARMC
CHLAMYDIA, DNA PROBE: NEGATIVE
NEISSERIA GONORRHEA: NEGATIVE

## 2018-03-22 NOTE — L&D Delivery Note (Signed)
Patient: Annette Greene MRN: [id number]  GBS status: Negative, IAP given: None   Patient is a 28 y.o. now [id number] s/p NSVD at [redacted]w[redacted]d, who was admitted for SOL. AROM 3h 71m prior to delivery with clear fluid.    Delivery Note At 8:00 AM a viable female was delivered via Vaginal, Spontaneous (Presentation: LOA).  APGAR: 9, 9; weight pending.   Placenta status: spontaneous, intact.  Cord: 3 vessel  with the following complications: none.   Anesthesia: Epidural   Episiotomy: None Lacerations: None Suture Repair: None Est. Blood Loss (mL): 144  Moderate meconium noted prior to delivery, amniotic fluid had previously been clear at time of AROM. Head delivered LOA. Nuchal cord x1 present, easily reduced on perineum. Shoulder and body delivered in usual fashion. Infant with spontaneous cry, placed on mother's abdomen, dried and bulb suctioned. Cord clamped x 2 after 1-minute delay, and cut by family member. Cord blood drawn. Placenta delivered spontaneously with gentle cord traction. Fundus firm with massage and Pitocin. Perineum inspected and found to have no lacerations.   Mom to postpartum.  Baby to Couplet care / Skin to Skin.  Melina Schools 07/21/2018, 8:41 AM

## 2018-03-28 ENCOUNTER — Encounter: Payer: Self-pay | Admitting: *Deleted

## 2018-03-30 ENCOUNTER — Encounter: Payer: Self-pay | Admitting: Obstetrics and Gynecology

## 2018-03-30 ENCOUNTER — Ambulatory Visit (INDEPENDENT_AMBULATORY_CARE_PROVIDER_SITE_OTHER): Payer: Managed Care, Other (non HMO) | Admitting: Obstetrics and Gynecology

## 2018-03-30 DIAGNOSIS — O0990 Supervision of high risk pregnancy, unspecified, unspecified trimester: Secondary | ICD-10-CM

## 2018-03-30 DIAGNOSIS — O0992 Supervision of high risk pregnancy, unspecified, second trimester: Secondary | ICD-10-CM

## 2018-03-30 DIAGNOSIS — O09212 Supervision of pregnancy with history of pre-term labor, second trimester: Secondary | ICD-10-CM

## 2018-03-30 DIAGNOSIS — O3432 Maternal care for cervical incompetence, second trimester: Secondary | ICD-10-CM

## 2018-03-30 DIAGNOSIS — O3430 Maternal care for cervical incompetence, unspecified trimester: Secondary | ICD-10-CM

## 2018-03-30 DIAGNOSIS — Z8619 Personal history of other infectious and parasitic diseases: Secondary | ICD-10-CM

## 2018-03-30 NOTE — Progress Notes (Signed)
   Subjective:    Annette Greene is a [id number] [redacted]w[redacted]d being seen today for her first obstetrical visit.  Her obstetrical history is significant for previous preterm delivery due to incompetent cervix, cerclage in place on weekly 17-P. Patient does intend to breast feed. Pregnancy history fully reviewed.  Patient reports no complaints.  Vitals:   03/30/18 0843  BP: 126/64  Pulse: 79  Weight: 292 lb 14.4 oz (132.9 kg)    HISTORY: OB History  Gravida Para Term Preterm AB Living  2 1 0 1   1  SAB TAB Ectopic Multiple Live Births          1    # Outcome Date GA Lbr Len/2nd Weight Sex Delivery Anes PTL Lv  2 Current           1 Preterm 2018 [redacted]w[redacted]d   M    LIV   Past Medical History:  Diagnosis Date  . Hx of chlamydia infection   . Infection    UTI  . Medical history non-contributory   . Preterm labor    Past Surgical History:  Procedure Laterality Date  . CERVICAL CERCLAGE N/A 01/25/2018   Procedure: CERCLAGE CERVICAL;  Surgeon: Everett Graff, MD;  Location: [place];  Service: Gynecology;  Laterality: N/A;  . TONSILLECTOMY    . TONSILLECTOMY AND ADENOIDECTOMY    . WISDOM TOOTH EXTRACTION     all 4 teeth   Family History  Problem Relation Age of Onset  . Healthy Mother   . Hypertension Mother   . Healthy Father   . Healthy Sister   . Healthy Maternal Grandmother   . Hypertension Maternal Grandmother   . Healthy Paternal Grandmother   . Healthy Paternal Grandfather   . Healthy Sister   . Drug abuse Maternal Aunt   . Hypertension Maternal Aunt      Exam      Assessment:    Pregnancy: G2P0101 Patient Active Problem List   Diagnosis Date Noted  . Abdominal pain affecting pregnancy 02/06/2018  . Obesity, morbid ([place]) 12/27/2017  . History of herpes simplex type 2 infection 12/27/2017  . Supervision of high risk pregnancy, antepartum 12/27/2017  . Premature delivery 12/10/2016  . Labor and delivery, indication for care 12/08/2016  . Incompetent  cervix in pregnancy, antepartum 11/24/2016        Plan:     Initial labs drawn. Prenatal vitamins. Problem list reviewed and updated.  Ultrasound discussed; fetal survey: ordered.  Follow up in 4 weeks. Continue weekly 17-P. Patient is self administering it Patient is interested in waterbirth 50% of 30 min visit spent on counseling and coordination of care.     Tija Biss 03/30/2018

## 2018-03-30 NOTE — Progress Notes (Signed)
[place] form filled out and faxed @ 1005

## 2018-03-30 NOTE — Progress Notes (Signed)
Anatomy ultrasound scheduled for 03/30/18 @ 0900.  Pt verbalized understanding

## 2018-03-30 NOTE — Patient Instructions (Addendum)
Considering Waterbirth? Guide for patients at [facility]  Why consider waterbirth?  . Gentle birth for babies . Less pain medicine used in labor . May allow for passive descent/less pushing . May reduce perineal tears  . More mobility and instinctive maternal position changes . Increased maternal relaxation . Reduced blood pressure in labor  Is waterbirth safe? What are the risks of infection, drowning or other complications?  . Infection: o Very low risk (3.7 % for tub vs 4.8% for bed) o 7 in 8000 waterbirths with documented infection o Poorly cleaned equipment most common cause o Slightly lower group B strep transmission rate  . Drowning o Maternal:  - Very low risk   - Related to seizures or fainting o Newborn:  - Very low risk. No evidence of increased risk of respiratory problems in multiple large studies - Physiological protection from breathing under water - Avoid underwater birth if there are any fetal complications - Once baby's head is out of the water, keep it out.  . Birth complication o Some reports of cord trauma, but risk decreased by bringing baby to surface gradually o No evidence of increased risk of shoulder dystocia. Mothers can usually change positions faster in water than in a bed, possibly aiding the maneuvers to free the shoulder.   You must attend a Doren Custard class at [facility]  3rd Wednesday of every month from 7-9pm  [employer] by calling [phone] or online at [email or web address]  Bring [place] the certificate from the class to your prenatal appointment  Meet with a midwife at 36 weeks to see if you can still plan a waterbirth and to sign the consent.   If you plan a waterbirth after May 14, 2018, at [facility], you will need to purchase the following:  Fish net  Bathing suit top (optional)  Long-handled mirror (optional)  If you plan a waterbirth before  May 14, 2018: Purchase or rent the following supplies: You are responsible for providing all supplies listed above. **If you do not have all necessary supplies you cannot have a waterbirth.**   Water Birth Pool (Birth Pool in a Box or [place] for instance)  (Tubs start ~$125)  Single-use disposable tub liner designed for your brand of tub  Electric drain pump to remove water (We recommend 792 gallon per hour or greater pump.)   New garden hose labeled "lead-free", "suitable for drinking water",  Separate garden hose to remove the dirty water  Fish net  Bathing suit top (optional)  Long-handled mirror (optional)  Places to purchase or rent supplies:   [employer] for tub purchases and supplies  [employer]com for tub purchases and supplies  [employer] (www.thelaborladies.com) $275 for tub rental/set-up & take down/kit   [employer] (http://www.fleming.com/.htm) Information regarding doulas (labor support) who provide pool rentals  Things that would prevent you from having a waterbirth:  Premature, <37wks  Previous cesarean birth  Presence of thick meconium-stained fluid  Multiple gestation (Twins, triplets, etc.)  Uncontrolled diabetes or gestational diabetes requiring medication  Hypertension requiring medication or diagnosis of pre-eclampsia  Heavy vaginal bleeding  Non-reassuring fetal heart rate  Active infection (MRSA, etc.). Group B Strep is NOT a contraindication for waterbirth.  If your labor has to be induced and induction method requires continuous monitoring of the baby's heart rate  Other risks/issues identified by your obstetrical provider  Please remember that birth is unpredictable. Under certain unforeseeable circumstances your  provider may advise against giving birth in the tub. These decisions will be made on a case-by-case basis and with the safety of you and your baby as our highest  priority.     Second Trimester of Pregnancy The second trimester is from week 14 through week 27 (months 4 through 6). The second trimester is often a time when you feel your best. Your body has adjusted to being pregnant, and you begin to feel better physically. Usually, morning sickness has lessened or quit completely, you may have more energy, and you may have an increase in appetite. The second trimester is also a time when the fetus is growing rapidly. At the end of the sixth month, the fetus is about 9 inches long and weighs about 1 pounds. You will likely begin to feel the baby move (quickening) between 16 and 20 weeks of pregnancy. Body changes during your second trimester Your body continues to go through many changes during your second trimester. The changes vary from woman to woman.  Your weight will continue to increase. You will notice your lower abdomen bulging out.  You may begin to get stretch marks on your hips, abdomen, and breasts.  You may develop headaches that can be relieved by medicines. The medicines should be approved by your health care provider.  You may urinate more often because the fetus is pressing on your bladder.  You may develop or continue to have heartburn as a result of your pregnancy.  You may develop constipation because certain hormones are causing the muscles that push waste through your intestines to slow down.  You may develop hemorrhoids or swollen, bulging veins (varicose veins).  You may have back pain. This is caused by: ? Weight gain. ? Pregnancy hormones that are relaxing the joints in your pelvis. ? A shift in weight and the muscles that support your balance.  Your breasts will continue to grow and they will continue to become tender.  Your gums may bleed and may be sensitive to brushing and flossing.  Dark spots or blotches (chloasma, mask of pregnancy) may develop on your face. This will likely fade after the baby is born.  A  dark line from your belly button to the pubic area (linea nigra) may appear. This will likely fade after the baby is born.  You may have changes in your hair. These can include thickening of your hair, rapid growth, and changes in texture. Some women also have hair loss during or after pregnancy, or hair that feels dry or thin. Your hair will most likely return to normal after your baby is born. What to expect at prenatal visits During a routine prenatal visit:  You will be weighed to make sure you and the fetus are growing normally.  Your blood pressure will be taken.  Your abdomen will be measured to track your baby's growth.  The fetal heartbeat will be listened to.  Any test results from the previous visit will be discussed. Your health care provider may ask you:  How you are feeling.  If you are feeling the baby move.  If you have had any abnormal symptoms, such as leaking fluid, bleeding, severe headaches, or abdominal cramping.  If you are using any tobacco products, including cigarettes, chewing tobacco, and electronic cigarettes.  If you have any questions. Other tests that may be performed during your second trimester include:  Blood tests that check for: ? Low iron levels (anemia). ? High blood sugar that affects pregnant  women (gestational diabetes) between 31 and 28 weeks. ? Rh antibodies. This is to check for a protein on red blood cells (Rh factor).  Urine tests to check for infections, diabetes, or protein in the urine.  An ultrasound to confirm the proper growth and development of the baby.  An amniocentesis to check for possible genetic problems.  Fetal screens for spina bifida and Down syndrome.  HIV (human immunodeficiency virus) testing. Routine prenatal testing includes screening for HIV, unless you choose not to have this test. Follow these instructions at home: Medicines  Follow your health care provider's instructions regarding medicine use.  Specific medicines may be either safe or unsafe to take during pregnancy.  Take a prenatal vitamin that contains at least 600 micrograms (mcg) of folic acid.  If you develop constipation, try taking a stool softener if your health care provider approves. Eating and drinking   Eat a balanced diet that includes fresh fruits and vegetables, whole grains, good sources of protein such as meat, eggs, or tofu, and low-fat dairy. Your health care provider will help you determine the amount of weight gain that is right for you.  Avoid raw meat and uncooked cheese. These carry germs that can cause birth defects in the baby.  If you have low calcium intake from food, talk to your health care provider about whether you should take a daily calcium supplement.  Limit foods that are high in fat and processed sugars, such as fried and sweet foods.  To prevent constipation: ? Drink enough fluid to keep your urine clear or pale yellow. ? Eat foods that are high in fiber, such as fresh fruits and vegetables, whole grains, and beans. Activity  Exercise only as directed by your health care provider. Most women can continue their usual exercise routine during pregnancy. Try to exercise for 30 minutes at least 5 days a week. Stop exercising if you experience uterine contractions.  Avoid heavy lifting, wear low heel shoes, and practice good posture.  A sexual relationship may be continued unless your health care provider directs you otherwise. Relieving pain and discomfort  Wear a good support bra to prevent discomfort from breast tenderness.  Take warm sitz baths to soothe any pain or discomfort caused by hemorrhoids. Use hemorrhoid cream if your health care provider approves.  Rest with your legs elevated if you have leg cramps or low back pain.  If you develop varicose veins, wear support hose. Elevate your feet for 15 minutes, 3-4 times a day. Limit salt in your diet. Prenatal Care  Write down your  questions. Take them to your prenatal visits.  Keep all your prenatal visits as told by your health care provider. This is important. Safety  Wear your seat belt at all times when driving.  Make a list of emergency phone numbers, including numbers for family, friends, the hospital, and police and fire departments. General instructions  Ask your health care provider for a referral to a local prenatal education class. Begin classes no later than the beginning of month 6 of your pregnancy.  Ask for help if you have counseling or nutritional needs during pregnancy. Your health care provider can offer advice or refer you to specialists for help with various needs.  Do not use hot tubs, steam rooms, or saunas.  Do not douche or use tampons or scented sanitary pads.  Do not cross your legs for long periods of time.  Avoid cat litter boxes and soil used by cats. These carry  germs that can cause birth defects in the baby and possibly loss of the fetus by miscarriage or stillbirth.  Avoid all smoking, herbs, alcohol, and unprescribed drugs. Chemicals in these products can affect the formation and growth of the baby.  Do not use any products that contain nicotine or tobacco, such as cigarettes and e-cigarettes. If you need help quitting, ask your health care provider.  Visit your dentist if you have not gone yet during your pregnancy. Use a soft toothbrush to brush your teeth and be gentle when you floss. Contact a health care provider if:  You have dizziness.  You have mild pelvic cramps, pelvic pressure, or nagging pain in the abdominal area.  You have persistent nausea, vomiting, or diarrhea.  You have a bad smelling vaginal discharge.  You have pain when you urinate. Get help right away if:  You have a fever.  You are leaking fluid from your vagina.  You have spotting or bleeding from your vagina.  You have severe abdominal cramping or pain.  You have rapid weight gain or  weight loss.  You have shortness of breath with chest pain.  You notice sudden or extreme swelling of your face, hands, ankles, feet, or legs.  You have not felt your baby move in over an hour.  You have severe headaches that do not go away when you take medicine.  You have vision changes. Summary  The second trimester is from week 14 through week 27 (months 4 through 6). It is also a time when the fetus is growing rapidly.  Your body goes through many changes during pregnancy. The changes vary from woman to woman.  Avoid all smoking, herbs, alcohol, and unprescribed drugs. These chemicals affect the formation and growth your baby.  Do not use any tobacco products, such as cigarettes, chewing tobacco, and e-cigarettes. If you need help quitting, ask your health care provider.  Contact your health care provider if you have any questions. Keep all prenatal visits as told by your health care provider. This is important. This information is not intended to replace advice given to you by your health care provider. Make sure you discuss any questions you have with your health care provider. Document Released: 03/02/2001 Document Revised: 04/13/2016 Document Reviewed: 04/13/2016 Elsevier Interactive Patient Education  2019 [place] of Pregnancy The third trimester is from week 28 through week 40 (months 7 through 9). The third trimester is a time when the unborn baby (fetus) is growing rapidly. At the end of the ninth month, the fetus is about 20 inches in length and weighs 6-10 pounds. Body changes during your third trimester Your body will continue to go through many changes during pregnancy. The changes vary from woman to woman. During the third trimester:  Your weight will continue to increase. You can expect to gain 25-35 pounds (11-16 kg) by the end of the pregnancy.  You may begin to get stretch marks on your hips, abdomen, and breasts.  You may urinate  more often because the fetus is moving lower into your pelvis and pressing on your bladder.  You may develop or continue to have heartburn. This is caused by increased hormones that slow down muscles in the digestive tract.  You may develop or continue to have constipation because increased hormones slow digestion and cause the muscles that push waste through your intestines to relax.  You may develop hemorrhoids. These are swollen veins (varicose veins) in the rectum that  can itch or be painful.  You may develop swollen, bulging veins (varicose veins) in your legs.  You may have increased body aches in the pelvis, back, or thighs. This is due to weight gain and increased hormones that are relaxing your joints.  You may have changes in your hair. These can include thickening of your hair, rapid growth, and changes in texture. Some women also have hair loss during or after pregnancy, or hair that feels dry or thin. Your hair will most likely return to normal after your baby is born.  Your breasts will continue to grow and they will continue to become tender. A yellow fluid (colostrum) may leak from your breasts. This is the first milk you are producing for your baby.  Your belly button may stick out.  You may notice more swelling in your hands, face, or ankles.  You may have increased tingling or numbness in your hands, arms, and legs. The skin on your belly may also feel numb.  You may feel short of breath because of your expanding uterus.  You may have more problems sleeping. This can be caused by the size of your belly, increased need to urinate, and an increase in your body's metabolism.  You may notice the fetus "dropping," or moving lower in your abdomen (lightening).  You may have increased vaginal discharge.  You may notice your joints feel loose and you may have pain around your pelvic bone. What to expect at prenatal visits You will have prenatal exams every 2 weeks until  week 36. Then you will have weekly prenatal exams. During a routine prenatal visit:  You will be weighed to make sure you and the baby are growing normally.  Your blood pressure will be taken.  Your abdomen will be measured to track your baby's growth.  The fetal heartbeat will be listened to.  Any test results from the previous visit will be discussed.  You may have a cervical check near your due date to see if your cervix has softened or thinned (effaced).  You will be tested for Group B streptococcus. This happens between 35 and 37 weeks. Your health care provider may ask you:  What your birth plan is.  How you are feeling.  If you are feeling the baby move.  If you have had any abnormal symptoms, such as leaking fluid, bleeding, severe headaches, or abdominal cramping.  If you are using any tobacco products, including cigarettes, chewing tobacco, and electronic cigarettes.  If you have any questions. Other tests or screenings that may be performed during your third trimester include:  Blood tests that check for low iron levels (anemia).  Fetal testing to check the health, activity level, and growth of the fetus. Testing is done if you have certain medical conditions or if there are problems during the pregnancy.  Nonstress test (NST). This test checks the health of your baby to make sure there are no signs of problems, such as the baby not getting enough oxygen. During this test, a belt is placed around your belly. The baby is made to move, and its heart rate is monitored during movement. What is false labor? False labor is a condition in which you feel small, irregular tightenings of the muscles in the womb (contractions) that usually go away with rest, changing position, or drinking water. These are called Braxton Hicks contractions. Contractions may last for hours, days, or even weeks before true labor sets in. If contractions come at regular intervals,  become more  frequent, increase in intensity, or become painful, you should see your health care provider. What are the signs of labor?  Abdominal cramps.  Regular contractions that start at 10 minutes apart and become stronger and more frequent with time.  Contractions that start on the top of the uterus and spread down to the lower abdomen and back.  Increased pelvic pressure and dull back pain.  A watery or bloody mucus discharge that comes from the vagina.  Leaking of amniotic fluid. This is also known as your "water breaking." It could be a slow trickle or a gush. Let your health care provider know if it has a color or strange odor. If you have any of these signs, call your health care provider right away, even if it is before your due date. Follow these instructions at home: Medicines  Follow your health care provider's instructions regarding medicine use. Specific medicines may be either safe or unsafe to take during pregnancy.  Take a prenatal vitamin that contains at least 600 micrograms (mcg) of folic acid.  If you develop constipation, try taking a stool softener if your health care provider approves. Eating and drinking   Eat a balanced diet that includes fresh fruits and vegetables, whole grains, good sources of protein such as meat, eggs, or tofu, and low-fat dairy. Your health care provider will help you determine the amount of weight gain that is right for you.  Avoid raw meat and uncooked cheese. These carry germs that can cause birth defects in the baby.  If you have low calcium intake from food, talk to your health care provider about whether you should take a daily calcium supplement.  Eat four or five small meals rather than three large meals a day.  Limit foods that are high in fat and processed sugars, such as fried and sweet foods.  To prevent constipation: ? Drink enough fluid to keep your urine clear or pale yellow. ? Eat foods that are high in fiber, such as fresh  fruits and vegetables, whole grains, and beans. Activity  Exercise only as directed by your health care provider. Most women can continue their usual exercise routine during pregnancy. Try to exercise for 30 minutes at least 5 days a week. Stop exercising if you experience uterine contractions.  Avoid heavy lifting.  Do not exercise in extreme heat or humidity, or at high altitudes.  Wear low-heel, comfortable shoes.  Practice good posture.  You may continue to have sex unless your health care provider tells you otherwise. Relieving pain and discomfort  Take frequent breaks and rest with your legs elevated if you have leg cramps or low back pain.  Take warm sitz baths to soothe any pain or discomfort caused by hemorrhoids. Use hemorrhoid cream if your health care provider approves.  Wear a good support bra to prevent discomfort from breast tenderness.  If you develop varicose veins: ? Wear support pantyhose or compression stockings as told by your healthcare provider. ? Elevate your feet for 15 minutes, 3-4 times a day. Prenatal care  Write down your questions. Take them to your prenatal visits.  Keep all your prenatal visits as told by your health care provider. This is important. Safety  Wear your seat belt at all times when driving.  Make a list of emergency phone numbers, including numbers for family, friends, the hospital, and police and fire departments. General instructions  Avoid cat litter boxes and soil used by cats. These carry germs that  can cause birth defects in the baby. If you have a cat, ask someone to clean the litter box for you.  Do not travel far distances unless it is absolutely necessary and only with the approval of your health care provider.  Do not use hot tubs, steam rooms, or saunas.  Do not drink alcohol.  Do not use any products that contain nicotine or tobacco, such as cigarettes and e-cigarettes. If you need help quitting, ask your health  care provider.  Do not use any medicinal herbs or unprescribed drugs. These chemicals affect the formation and growth of the baby.  Do not douche or use tampons or scented sanitary pads.  Do not cross your legs for long periods of time.  To prepare for the arrival of your baby: ? Take prenatal classes to understand, practice, and ask questions about labor and delivery. ? Make a trial run to the hospital. ? Visit the hospital and tour the maternity area. ? Arrange for maternity or paternity leave through employers. ? Arrange for family and friends to take care of pets while you are in the hospital. ? Purchase a rear-facing car seat and make sure you know how to install it in your car. ? Pack your hospital bag. ? Prepare the baby's nursery. Make sure to remove all pillows and stuffed animals from the baby's crib to prevent suffocation.  Visit your dentist if you have not gone during your pregnancy. Use a soft toothbrush to brush your teeth and be gentle when you floss. Contact a health care provider if:  You are unsure if you are in labor or if your water has broken.  You become dizzy.  You have mild pelvic cramps, pelvic pressure, or nagging pain in your abdominal area.  You have lower back pain.  You have persistent nausea, vomiting, or diarrhea.  You have an unusual or bad smelling vaginal discharge.  You have pain when you urinate. Get help right away if:  Your water breaks before 37 weeks.  You have regular contractions less than 5 minutes apart before 37 weeks.  You have a fever.  You are leaking fluid from your vagina.  You have spotting or bleeding from your vagina.  You have severe abdominal pain or cramping.  You have rapid weight loss or weight gain.  You have shortness of breath with chest pain.  You notice sudden or extreme swelling of your face, hands, ankles, feet, or legs.  Your baby makes fewer than 10 movements in 2 hours.  You have severe  headaches that do not go away when you take medicine.  You have vision changes. Summary  The third trimester is from week 28 through week 40, months 7 through 9. The third trimester is a time when the unborn baby (fetus) is growing rapidly.  During the third trimester, your discomfort may increase as you and your baby continue to gain weight. You may have abdominal, leg, and back pain, sleeping problems, and an increased need to urinate.  During the third trimester your breasts will keep growing and they will continue to become tender. A yellow fluid (colostrum) may leak from your breasts. This is the first milk you are producing for your baby.  False labor is a condition in which you feel small, irregular tightenings of the muscles in the womb (contractions) that eventually go away. These are called Braxton Hicks contractions. Contractions may last for hours, days, or even weeks before true labor sets in.  Signs of  labor can include: abdominal cramps; regular contractions that start at 10 minutes apart and become stronger and more frequent with time; watery or bloody mucus discharge that comes from the vagina; increased pelvic pressure and dull back pain; and leaking of amniotic fluid. This information is not intended to replace advice given to you by your health care provider. Make sure you discuss any questions you have with your health care provider. Document Released: 03/02/2001 Document Revised: 04/13/2016 Document Reviewed: 04/13/2016 Elsevier Interactive Patient Education  2019 [employer].   Contraception Choices Contraception, also called birth control, refers to methods or devices that prevent pregnancy. Hormonal methods Contraceptive implant  A contraceptive implant is a thin, plastic tube that contains a hormone. It is inserted into the upper part of the arm. It can remain in place for up to 3 years. Progestin-only injections Progestin-only injections are injections of  progestin, a synthetic form of the hormone progesterone. They are given every 3 months by a health care provider. Birth control pills  Birth control pills are pills that contain hormones that prevent pregnancy. They must be taken once a day, preferably at the same time each day. Birth control patch  The birth control patch contains hormones that prevent pregnancy. It is placed on the skin and must be changed once a week for three weeks and removed on the fourth week. A prescription is needed to use this method of contraception. Vaginal ring  A vaginal ring contains hormones that prevent pregnancy. It is placed in the vagina for three weeks and removed on the fourth week. After that, the process is repeated with a new ring. A prescription is needed to use this method of contraception. Emergency contraceptive Emergency contraceptives prevent pregnancy after unprotected sex. They come in pill form and can be taken up to 5 days after sex. They work best the sooner they are taken after having sex. Most emergency contraceptives are available without a prescription. This method should not be used as your only form of birth control. Barrier methods Female condom  A female condom is a thin sheath that is worn over the penis during sex. Condoms keep sperm from going inside a woman's body. They can be used with a spermicide to increase their effectiveness. They should be disposed after a single use. Female condom  A female condom is a soft, loose-fitting sheath that is put into the vagina before sex. The condom keeps sperm from going inside a woman's body. They should be disposed after a single use. Diaphragm  A diaphragm is a soft, dome-shaped barrier. It is inserted into the vagina before sex, along with a spermicide. The diaphragm blocks sperm from entering the uterus, and the spermicide kills sperm. A diaphragm should be left in the vagina for 6-8 hours after sex and removed within 24 hours. A diaphragm  is prescribed and fitted by a health care provider. A diaphragm should be replaced every 1-2 years, after giving birth, after gaining more than 15 lb (6.8 kg), and after pelvic surgery. Cervical cap  A cervical cap is a round, soft latex or plastic cup that fits over the cervix. It is inserted into the vagina before sex, along with spermicide. It blocks sperm from entering the uterus. The cap should be left in place for 6-8 hours after sex and removed within 48 hours. A cervical cap must be prescribed and fitted by a health care provider. It should be replaced every 2 years. Sponge  A sponge is a soft, circular  piece of polyurethane foam with spermicide on it. The sponge helps block sperm from entering the uterus, and the spermicide kills sperm. To use it, you make it wet and then insert it into the vagina. It should be inserted before sex, left in for at least 6 hours after sex, and removed and thrown away within 30 hours. Spermicides Spermicides are chemicals that kill or block sperm from entering the cervix and uterus. They can come as a cream, jelly, suppository, foam, or tablet. A spermicide should be inserted into the vagina with an applicator at least 38-46 minutes before sex to allow time for it to work. The process must be repeated every time you have sex. Spermicides do not require a prescription. Intrauterine contraception Intrauterine device (IUD) An IUD is a T-shaped device that is put in a woman's uterus. There are two types:  Hormone IUD.This type contains progestin, a synthetic form of the hormone progesterone. This type can stay in place for 3-5 years.  Copper IUD.This type is wrapped in copper wire. It can stay in place for 10 years.  Permanent methods of contraception Female tubal ligation In this method, a woman's fallopian tubes are sealed, tied, or blocked during surgery to prevent eggs from traveling to the uterus. Hysteroscopic sterilization In this method, a small,  flexible insert is placed into each fallopian tube. The inserts cause scar tissue to form in the fallopian tubes and block them, so sperm cannot reach an egg. The procedure takes about 3 months to be effective. Another form of birth control must be used during those 3 months. Female sterilization This is a procedure to tie off the tubes that carry sperm (vasectomy). After the procedure, the man can still ejaculate fluid (semen). Natural planning methods Natural family planning In this method, a couple does not have sex on days when the woman could become pregnant. Calendar method This means keeping track of the length of each menstrual cycle, identifying the days when pregnancy can happen, and not having sex on those days. Ovulation method In this method, a couple avoids sex during ovulation. Symptothermal method This method involves not having sex during ovulation. The woman typically checks for ovulation by watching changes in her temperature and in the consistency of cervical mucus. Post-ovulation method In this method, a couple waits to have sex until after ovulation. Summary  Contraception, also called birth control, means methods or devices that prevent pregnancy.  Hormonal methods of contraception include implants, injections, pills, patches, vaginal rings, and emergency contraceptives.  Barrier methods of contraception can include female condoms, female condoms, diaphragms, cervical caps, sponges, and spermicides.  There are two types of IUDs (intrauterine devices). An IUD can be put in a woman's uterus to prevent pregnancy for 3-5 years.  Permanent sterilization can be done through a procedure for males, females, or both.  Natural family planning methods involve not having sex on days when the woman could become pregnant. This information is not intended to replace advice given to you by your health care provider. Make sure you discuss any questions you have with your health care  provider. Document Released: 03/08/2005 Document Revised: 03/10/2017 Document Reviewed: 04/10/2016 Elsevier Interactive Patient Education  2019 [employer].   Breastfeeding  Choosing to breastfeed is one of the best decisions you can make for yourself and your baby. A change in hormones during pregnancy causes your breasts to make breast milk in your milk-producing glands. Hormones prevent breast milk from being released before your baby is born.  They also prompt milk flow after birth. Once breastfeeding has begun, thoughts of your baby, as well as his or her sucking or crying, can stimulate the release of milk from your milk-producing glands. Benefits of breastfeeding Research shows that breastfeeding offers many health benefits for infants and mothers. It also offers a cost-free and convenient way to feed your baby. For your baby  Your first milk (colostrum) helps your baby's digestive system to function better.  Special cells in your milk (antibodies) help your baby to fight off infections.  Breastfed babies are less likely to develop asthma, allergies, obesity, or type 2 diabetes. They are also at lower risk for sudden infant death syndrome (SIDS).  Nutrients in breast milk are better able to meet your baby's needs compared to infant formula.  Breast milk improves your baby's brain development. For you  Breastfeeding helps to create a very special bond between you and your baby.  Breastfeeding is convenient. Breast milk costs nothing and is always available at the correct temperature.  Breastfeeding helps to burn calories. It helps you to lose the weight that you gained during pregnancy.  Breastfeeding makes your uterus return faster to its size before pregnancy. It also slows bleeding (lochia) after you give birth.  Breastfeeding helps to lower your risk of developing type 2 diabetes, osteoporosis, rheumatoid arthritis, cardiovascular disease, and breast, ovarian, uterine, and  endometrial cancer later in life. Breastfeeding basics Starting breastfeeding  Find a comfortable place to sit or lie down, with your neck and back well-supported.  Place a pillow or a rolled-up blanket under your baby to bring him or her to the level of your breast (if you are seated). Nursing pillows are specially designed to help support your arms and your baby while you breastfeed.  Make sure that your baby's tummy (abdomen) is facing your abdomen.  Gently massage your breast. With your fingertips, massage from the outer edges of your breast inward toward the nipple. This encourages milk flow. If your milk flows slowly, you may need to continue this action during the feeding.  Support your breast with 4 fingers underneath and your thumb above your nipple (make the letter "C" with your hand). Make sure your fingers are well away from your nipple and your baby's mouth.  Stroke your baby's lips gently with your finger or nipple.  When your baby's mouth is open wide enough, quickly bring your baby to your breast, placing your entire nipple and as much of the areola as possible into your baby's mouth. The areola is the colored area around your nipple. ? More areola should be visible above your baby's upper lip than below the lower lip. ? Your baby's lips should be opened and extended outward (flanged) to ensure an adequate, comfortable latch. ? Your baby's tongue should be between his or her lower gum and your breast.  Make sure that your baby's mouth is correctly positioned around your nipple (latched). Your baby's lips should create a seal on your breast and be turned out (everted).  It is common for your baby to suck about 2-3 minutes in order to start the flow of breast milk. Latching Teaching your baby how to latch onto your breast properly is very important. An improper latch can cause nipple pain, decreased milk supply, and poor weight gain in your baby. Also, if your baby is not  latched onto your nipple properly, he or she may swallow some air during feeding. This can make your baby fussy. Burping your  baby when you switch breasts during the feeding can help to get rid of the air. However, teaching your baby to latch on properly is still the best way to prevent fussiness from swallowing air while breastfeeding. Signs that your baby has successfully latched onto your nipple  Silent tugging or silent sucking, without causing you pain. Infant's lips should be extended outward (flanged).  Swallowing heard between every 3-4 sucks once your milk has started to flow (after your let-down milk reflex occurs).  Muscle movement above and in front of his or her ears while sucking. Signs that your baby has not successfully latched onto your nipple  Sucking sounds or smacking sounds from your baby while breastfeeding.  Nipple pain. If you think your baby has not latched on correctly, slip your finger into the corner of your baby's mouth to break the suction and place it between your baby's gums. Attempt to start breastfeeding again. Signs of successful breastfeeding Signs from your baby  Your baby will gradually decrease the number of sucks or will completely stop sucking.  Your baby will fall asleep.  Your baby's body will relax.  Your baby will retain a small amount of milk in his or her mouth.  Your baby will let go of your breast by himself or herself. Signs from you  Breasts that have increased in firmness, weight, and size 1-3 hours after feeding.  Breasts that are softer immediately after breastfeeding.  Increased milk volume, as well as a change in milk consistency and color by the fifth day of breastfeeding.  Nipples that are not sore, cracked, or bleeding. Signs that your baby is getting enough milk  Wetting at least 1-2 diapers during the first 24 hours after birth.  Wetting at least 5-6 diapers every 24 hours for the first week after birth. The urine  should be clear or pale yellow by the age of 5 days.  Wetting 6-8 diapers every 24 hours as your baby continues to grow and develop.  At least 3 stools in a 24-hour period by the age of 5 days. The stool should be soft and yellow.  At least 3 stools in a 24-hour period by the age of 7 days. The stool should be seedy and yellow.  No loss of weight greater than 10% of birth weight during the first 3 days of life.  Average weight gain of 4-7 oz (113-198 g) per week after the age of 4 days.  Consistent daily weight gain by the age of 5 days, without weight loss after the age of 2 weeks. After a feeding, your baby may spit up a small amount of milk. This is normal. Breastfeeding frequency and duration Frequent feeding will help you make more milk and can prevent sore nipples and extremely full breasts (breast engorgement). Breastfeed when you feel the need to reduce the fullness of your breasts or when your baby shows signs of hunger. This is called "breastfeeding on demand." Signs that your baby is hungry include:  Increased alertness, activity, or restlessness.  Movement of the head from side to side.  Opening of the mouth when the corner of the mouth or cheek is stroked (rooting).  Increased sucking sounds, smacking lips, cooing, sighing, or squeaking.  Hand-to-mouth movements and sucking on fingers or hands.  Fussing or crying. Avoid introducing a pacifier to your baby in the first 4-6 weeks after your baby is born. After this time, you may choose to use a pacifier. Research has shown that pacifier  use during the first year of a baby's life decreases the risk of sudden infant death syndrome (SIDS). Allow your baby to feed on each breast as long as he or she wants. When your baby unlatches or falls asleep while feeding from the first breast, offer the second breast. Because newborns are often sleepy in the first few weeks of life, you may need to awaken your baby to get him or her to  feed. Breastfeeding times will vary from baby to baby. However, the following rules can serve as a guide to help you make sure that your baby is properly fed:  Newborns (babies 71 weeks of age or younger) may breastfeed every 1-3 hours.  Newborns should not go without breastfeeding for longer than 3 hours during the day or 5 hours during the night.  You should breastfeed your baby a minimum of 8 times in a 24-hour period. Breast milk pumping     Pumping and storing breast milk allows you to make sure that your baby is exclusively fed your breast milk, even at times when you are unable to breastfeed. This is especially important if you go back to work while you are still breastfeeding, or if you are not able to be present during feedings. Your lactation consultant can help you find a method of pumping that works best for you and give you guidelines about how long it is safe to store breast milk. Caring for your breasts while you breastfeed Nipples can become dry, cracked, and sore while breastfeeding. The following recommendations can help keep your breasts moisturized and healthy:  Avoid using soap on your nipples.  Wear a supportive bra designed especially for nursing. Avoid wearing underwire-style bras or extremely tight bras (sports bras).  Air-dry your nipples for 3-4 minutes after each feeding.  Use only cotton bra pads to absorb leaked breast milk. Leaking of breast milk between feedings is normal.  Use lanolin on your nipples after breastfeeding. Lanolin helps to maintain your skin's normal moisture barrier. Pure lanolin is not harmful (not toxic) to your baby. You may also hand express a few drops of breast milk and gently massage that milk into your nipples and allow the milk to air-dry. In the first few weeks after giving birth, some women experience breast engorgement. Engorgement can make your breasts feel heavy, warm, and tender to the touch. Engorgement peaks within 3-5 days  after you give birth. The following recommendations can help to ease engorgement:  Completely empty your breasts while breastfeeding or pumping. You may want to start by applying warm, moist heat (in the shower or with warm, water-soaked hand towels) just before feeding or pumping. This increases circulation and helps the milk flow. If your baby does not completely empty your breasts while breastfeeding, pump any extra milk after he or she is finished.  Apply ice packs to your breasts immediately after breastfeeding or pumping, unless this is too uncomfortable for you. To do this: ? Put ice in a plastic bag. ? Place a towel between your skin and the bag. ? Leave the ice on for 20 minutes, 2-3 times a day.  Make sure that your baby is latched on and positioned properly while breastfeeding. If engorgement persists after 48 hours of following these recommendations, contact your health care provider or a Science writer. Overall health care recommendations while breastfeeding  Eat 3 healthy meals and 3 snacks every day. Well-nourished mothers who are breastfeeding need an additional 450-500 calories a day. You can  meet this requirement by increasing the amount of a balanced diet that you eat.  Drink enough water to keep your urine pale yellow or clear.  Rest often, relax, and continue to take your prenatal vitamins to prevent fatigue, stress, and low vitamin and mineral levels in your body (nutrient deficiencies).  Do not use any products that contain nicotine or tobacco, such as cigarettes and e-cigarettes. Your baby may be harmed by chemicals from cigarettes that pass into breast milk and exposure to secondhand smoke. If you need help quitting, ask your health care provider.  Avoid alcohol.  Do not use illegal drugs or marijuana.  Talk with your health care provider before taking any medicines. These include over-the-counter and prescription medicines as well as vitamins and herbal  supplements. Some medicines that may be harmful to your baby can pass through breast milk.  It is possible to become pregnant while breastfeeding. If birth control is desired, ask your health care provider about options that will be safe while breastfeeding your baby. Where to find more information: [employer]: www.llli.org Contact a health care provider if:  You feel like you want to stop breastfeeding or have become frustrated with breastfeeding.  Your nipples are cracked or bleeding.  Your breasts are red, tender, or warm.  You have: ? Painful breasts or nipples. ? A swollen area on either breast. ? A fever or chills. ? Nausea or vomiting. ? Drainage other than breast milk from your nipples.  Your breasts do not become full before feedings by the fifth day after you give birth.  You feel sad and depressed.  Your baby is: ? Too sleepy to eat well. ? Having trouble sleeping. ? More than 60 week old and wetting fewer than 6 diapers in a 24-hour period. ? Not gaining weight by 56 days of age.  Your baby has fewer than 3 stools in a 24-hour period.  Your baby's skin or the white parts of his or her eyes become yellow. Get help right away if:  Your baby is overly tired (lethargic) and does not want to wake up and feed.  Your baby develops an unexplained fever. Summary  Breastfeeding offers many health benefits for infant and mothers.  Try to breastfeed your infant when he or she shows early signs of hunger.  Gently tickle or stroke your baby's lips with your finger or nipple to allow the baby to open his or her mouth. Bring the baby to your breast. Make sure that much of the areola is in your baby's mouth. Offer one side and burp the baby before you offer the other side.  Talk with your health care provider or lactation consultant if you have questions or you face problems as you breastfeed. This information is not intended to replace advice given to you  by your health care provider. Make sure you discuss any questions you have with your health care provider. Document Released: 03/08/2005 Document Revised: 04/09/2016 Document Reviewed: 04/09/2016 Elsevier Interactive Patient Education  2019 [employer].

## 2018-03-31 ENCOUNTER — Ambulatory Visit (HOSPITAL_COMMUNITY): Payer: Managed Care, Other (non HMO)

## 2018-04-03 ENCOUNTER — Encounter (HOSPITAL_COMMUNITY): Payer: Self-pay | Admitting: Student

## 2018-04-03 ENCOUNTER — Inpatient Hospital Stay (HOSPITAL_COMMUNITY)
Admission: AD | Admit: 2018-04-03 | Discharge: 2018-04-03 | Disposition: A | Discharge status: Home / Self Care | Payer: BLUE CROSS/BLUE SHIELD | Attending: Obstetrics and Gynecology | Admitting: Obstetrics and Gynecology

## 2018-04-03 DIAGNOSIS — R51 Headache: Secondary | ICD-10-CM | POA: Diagnosis present

## 2018-04-03 DIAGNOSIS — O26892 Other specified pregnancy related conditions, second trimester: Secondary | ICD-10-CM

## 2018-04-03 DIAGNOSIS — Z87891 Personal history of nicotine dependence: Secondary | ICD-10-CM | POA: Insufficient documentation

## 2018-04-03 DIAGNOSIS — Z3A23 23 weeks gestation of pregnancy: Secondary | ICD-10-CM

## 2018-04-03 DIAGNOSIS — R519 Headache, unspecified: Secondary | ICD-10-CM

## 2018-04-03 LAB — URINALYSIS, ROUTINE W REFLEX MICROSCOPIC
Bilirubin Urine: NEGATIVE
Glucose, UA: NEGATIVE mg/dL
HGB URINE DIPSTICK: NEGATIVE
Ketones, ur: NEGATIVE mg/dL
LEUKOCYTES UA: NEGATIVE
NITRITE: NEGATIVE
PROTEIN: NEGATIVE mg/dL
SPECIFIC GRAVITY, URINE: 1.017 (ref 1.005–1.030)
pH: 6 (ref 5.0–8.0)

## 2018-04-03 MED ORDER — METOCLOPRAMIDE HCL 10 MG PO TABS: 10.0000 mg | ORAL_TABLET | Freq: Three times a day (TID) | ORAL | 0 refills | Status: DC | PRN

## 2018-04-03 NOTE — MAU Provider Note (Signed)
Chief Complaint:  Headache; Dizziness; and Leg Swelling   First Provider Initiated Contact with Patient 04/03/18 1344     HPI: Annette Greene is a 28 y.o. G2P0101 at [redacted]w[redacted]d who presents to maternity admissions reporting headache. Has had headache since Saturday. Has been taking 2 ES tylenol intermittently for pain without relief. Also reports occasional dizziness. Took BP at home and it was elevated. Denies any history of hypertension. Also reports increased leg swelling over the weekend.  Denies contractions, leakage of fluid or vaginal bleeding. Good fetal movement.  Location: frontal headache Quality: aching Severity: 9/10 in pain scale Duration: 2 days Timing: constant Modifying factors: not improved with tylenol, last took yesterday. Photophobia.  Associated signs and symptoms: none  Past Medical History:  Diagnosis Date  . Hx of chlamydia infection   . Infection    UTI  . Preterm labor    OB History  Gravida Para Term Preterm AB Living  2 1 0 1   1  SAB TAB Ectopic Multiple Live Births          1    # Outcome Date GA Lbr Len/2nd Weight Sex Delivery Anes PTL Lv  2 Current           1 Preterm 2018 [redacted]w[redacted]d   M Vag-Spont   LIV   Past Surgical History:  Procedure Laterality Date  . CERVICAL CERCLAGE N/A 01/25/2018   Procedure: CERCLAGE CERVICAL;  Surgeon: Everett Graff, MD;  Location: [facility];  Service: Gynecology;  Laterality: N/A;  . TONSILLECTOMY AND ADENOIDECTOMY    . WISDOM TOOTH EXTRACTION     all 4 teeth   Family History  Problem Relation Age of Onset  . Healthy Mother   . Hypertension Mother   . Healthy Father   . Healthy Sister   . Healthy Maternal Grandmother   . Hypertension Maternal Grandmother   . Healthy Paternal Grandmother   . Healthy Paternal Grandfather   . Healthy Sister   . Drug abuse Maternal Aunt   . Hypertension Maternal Aunt    Social History   Tobacco Use  . Smoking status: Former Smoker    Packs/day: 0.50    Types:  Cigarettes    Last attempt to quit: 06/24/2016    Years since quitting: 1.7  . Smokeless tobacco: Never Used  . Tobacco comment: stopped since pregnant  Substance Use Topics  . Alcohol use: Not Currently    Comment: stopped with pregnancy  . Drug use: Not Currently   No Known Allergies Medications Prior to Admission  Medication Sig Dispense Refill Last Dose  . acetaminophen (TYLENOL) 500 MG tablet Take 500 mg by mouth every 6 (six) hours as needed for headache.   04/02/2018 at Unknown time  . Prenatal Vit-Fe Fumarate-FA (MULTIVITAMIN-PRENATAL) 27-0.8 MG TABS tablet Take 1 tablet by mouth daily.   04/03/2018 at Unknown time  . metroNIDAZOLE (FLAGYL) 500 MG tablet Take 1 tablet (500 mg total) by mouth 2 (two) times daily. (Patient not taking: Reported on 04/03/2018) 14 tablet 0 Not Taking at Unknown time    I have reviewed patient's Past Medical Hx, Surgical Hx, Family Hx, Social Hx, medications and allergies.   ROS:  Review of Systems  Constitutional: Negative.   Eyes: Negative for visual disturbance.  Respiratory: Negative for shortness of breath.   Cardiovascular: Positive for leg swelling. Negative for chest pain.  Gastrointestinal: Negative.   Genitourinary: Negative.   Neurological: Positive for dizziness and headaches.    Physical Exam  Patient Vitals for the past 24 hrs:  BP Temp Temp src Pulse Resp SpO2 Weight  04/03/18 1353 (!) 115/47 - - 80 - - -  04/03/18 1317 (!) 111/57 98.8 F (37.1 C) Oral 79 18 100 % 132.9 kg    Constitutional: Well-developed, well-nourished female in no acute distress.  Cardiovascular: normal rate & rhythm, no murmur Respiratory: normal effort, lung sounds clear throughout GI: Abd soft, non-tender, gravid appropriate for gestational age. Pos BS x 4 MS: Extremities nontender, Minimal BLE edema, normal ROM Neurologic: Alert and oriented x 4. No clonus  NST:  Baseline: 150 bpm, Variability: Good {> 6 bpm), Accelerations: Non-reactive but  appropriate for gestational age and Decelerations: Absent   Labs: No results found for this or any previous visit (from the past 24 hour(s)).  Imaging:  No results found.  MAU Course: Orders Placed This Encounter  Procedures  . Urinalysis, Routine w reflex microscopic  . Discharge patient   Meds ordered this encounter  Medications  . metoCLOPramide (REGLAN) 10 MG tablet    Sig: Take 1 tablet (10 mg total) by mouth every 8 (eight) hours as needed (headache).    Dispense:  30 tablet    Refill:  0    Order Specific Question:   Supervising Provider    Answer:   ERVIN, MICHAEL L [[id number]]    MDM: Pt normotensive in MAU. Offered meds for headache in MAU but patient needs to leave for childcare issues. Will send patient home w/rx for reglan to use with tylenol.   Assessment: 1. Headache in pregnancy, antepartum, second trimester   2. [redacted] weeks gestation of pregnancy     Plan: Discharge home in stable condition.  Preterm Labor precautions and fetal kick counts Rx reglan to be used with tylenol Discussed reasons to return to MAU   Allergies as of 04/03/2018   No Known Allergies     Medication List    STOP taking these medications   metroNIDAZOLE 500 MG tablet Commonly known as:  FLAGYL     TAKE these medications   acetaminophen 500 MG tablet Commonly known as:  TYLENOL Take 500 mg by mouth every 6 (six) hours as needed for headache.   metoCLOPramide 10 MG tablet Commonly known as:  REGLAN Take 1 tablet (10 mg total) by mouth every 8 (eight) hours as needed (headache).   multivitamin-prenatal 27-0.8 MG Tabs tablet Take 1 tablet by mouth daily.       Jorje Guild, NP 04/03/2018 2:04 PM

## 2018-04-03 NOTE — MAU Note (Signed)
Pt states she's having feet swelling since yesterday evening. Headache started Saturday night, took tylenol with out relief. Having Dizziness since yesterday. Took a BP with a home cuff and it was high.

## 2018-04-03 NOTE — Discharge Instructions (Signed)
General Headache Without Cause A headache is pain or discomfort that is felt around the head or neck area. There are many causes and types of headaches. In some cases, the cause may not be found. Follow these instructions at home: Watch your condition for any changes. Let your doctor know about them. Take these steps to help with your condition: Managing pain      Take over-the-counter and prescription medicines only as told by your doctor.  Lie down in a dark, quiet room when you have a headache.  If told, put ice on your head and neck area: ? Put ice in a plastic bag. ? Place a towel between your skin and the bag. ? Leave the ice on for 20 minutes, 2-3 times per day.  If told, put heat on the affected area. Use the heat source that your doctor recommends, such as a moist heat pack or a heating pad. ? Place a towel between your skin and the heat source. ? Leave the heat on for 20-30 minutes. ? Remove the heat if your skin turns bright red. This is very important if you are unable to feel pain, heat, or cold. You may have a greater risk of getting burned.  Keep lights dim if bright lights bother you or make your headaches worse. Eating and drinking  Eat meals on a regular schedule.  If you drink alcohol: ? Limit how much you use to:  0-1 drink a day for women.  0-2 drinks a day for men. ? Be aware of how much alcohol is in your drink. In the [place] one drink equals one 12 oz bottle of beer (355 mL), one 5 oz glass of wine (148 mL), or one 1 oz glass of hard liquor (44 mL).  Stop drinking caffeine, or reduce how much caffeine you drink. General instructions   Keep a journal to find out if certain things bring on headaches. For example, write down: ? What you eat and drink. ? How much sleep you get. ? Any change to your diet or medicines.  Get a massage or try other ways to relax.  Limit stress.  Sit up straight. Do not tighten (tense) your muscles.  Do not use any  products that contain nicotine or tobacco. This includes cigarettes, e-cigarettes, and chewing tobacco. If you need help quitting, ask your doctor.  Exercise regularly as told by your doctor.  Get enough sleep. This often means 7-9 hours of sleep each night.  Keep all follow-up visits as told by your doctor. This is important. Contact a doctor if:  Your symptoms are not helped by medicine.  You have a headache that feels different than the other headaches.  You feel sick to your stomach (nauseous) or you throw up (vomit).  You have a fever. Get help right away if:  Your headache gets very bad quickly.  Your headache gets worse after a lot of physical activity.  You keep throwing up.  You have a stiff neck.  You have trouble seeing.  You have trouble speaking.  You have pain in the eye or ear.  Your muscles are weak or you lose muscle control.  You lose your balance or have trouble walking.  You feel like you will pass out (faint) or you pass out.  You are mixed up (confused).  You have a seizure. Summary  A headache is pain or discomfort that is felt around the head or neck area.  There are many causes and   types of headaches. In some cases, the cause may not be found.  Keep a journal to help find out what causes your headaches. Watch your condition for any changes. Let your doctor know about them.  Contact a doctor if you have a headache that is different from usual, or if your headache is not helped by medicine.  Get help right away if your headache gets very bad, you throw up, you have trouble seeing, you lose your balance, or you have a seizure. This information is not intended to replace advice given to you by your health care provider. Make sure you discuss any questions you have with your health care provider. Document Released: 12/16/2007 Document Revised: 09/26/2017 Document Reviewed: 09/26/2017 Elsevier Interactive Patient Education  2019 Elsevier  Inc.  

## 2018-04-05 ENCOUNTER — Other Ambulatory Visit (HOSPITAL_COMMUNITY): Payer: Managed Care, Other (non HMO)

## 2018-04-07 ENCOUNTER — Inpatient Hospital Stay (HOSPITAL_COMMUNITY): Admission: RE | Admit: 2018-04-07 | Payer: Managed Care, Other (non HMO) | Source: Ambulatory Visit

## 2018-04-10 ENCOUNTER — Encounter (HOSPITAL_COMMUNITY): Payer: Self-pay

## 2018-04-11 ENCOUNTER — Encounter: Payer: Self-pay | Admitting: *Deleted

## 2018-04-11 DIAGNOSIS — O0990 Supervision of high risk pregnancy, unspecified, unspecified trimester: Secondary | ICD-10-CM

## 2018-04-17 ENCOUNTER — Ambulatory Visit (HOSPITAL_COMMUNITY)
Admission: RE | Admit: 2018-04-17 | Discharge: 2018-04-17 | Disposition: A | Discharge status: Home / Self Care | Payer: Managed Care, Other (non HMO) | Source: Ambulatory Visit | Attending: Obstetrics and Gynecology | Admitting: Obstetrics and Gynecology

## 2018-04-17 ENCOUNTER — Encounter (HOSPITAL_COMMUNITY): Payer: Self-pay

## 2018-04-17 ENCOUNTER — Other Ambulatory Visit (HOSPITAL_COMMUNITY): Payer: Self-pay | Admitting: *Deleted

## 2018-04-17 DIAGNOSIS — Z363 Encounter for antenatal screening for malformations: Secondary | ICD-10-CM

## 2018-04-17 DIAGNOSIS — O99212 Obesity complicating pregnancy, second trimester: Secondary | ICD-10-CM | POA: Diagnosis not present

## 2018-04-17 DIAGNOSIS — O3432 Maternal care for cervical incompetence, second trimester: Secondary | ICD-10-CM

## 2018-04-17 DIAGNOSIS — O0990 Supervision of high risk pregnancy, unspecified, unspecified trimester: Secondary | ICD-10-CM | POA: Diagnosis not present

## 2018-04-17 DIAGNOSIS — R109 Unspecified abdominal pain: Secondary | ICD-10-CM | POA: Insufficient documentation

## 2018-04-17 DIAGNOSIS — O26899 Other specified pregnancy related conditions, unspecified trimester: Secondary | ICD-10-CM

## 2018-04-17 DIAGNOSIS — O09212 Supervision of pregnancy with history of pre-term labor, second trimester: Secondary | ICD-10-CM | POA: Diagnosis not present

## 2018-04-17 DIAGNOSIS — O09219 Supervision of pregnancy with history of pre-term labor, unspecified trimester: Secondary | ICD-10-CM

## 2018-04-17 DIAGNOSIS — Z3A25 25 weeks gestation of pregnancy: Secondary | ICD-10-CM

## 2018-04-20 ENCOUNTER — Inpatient Hospital Stay (HOSPITAL_COMMUNITY)
Admission: AD | Admit: 2018-04-20 | Discharge: 2018-04-20 | Disposition: A | Discharge status: Home / Self Care | Payer: Managed Care, Other (non HMO) | Attending: Obstetrics and Gynecology | Admitting: Obstetrics and Gynecology

## 2018-04-20 ENCOUNTER — Other Ambulatory Visit: Payer: Self-pay

## 2018-04-20 ENCOUNTER — Encounter (HOSPITAL_COMMUNITY): Payer: Self-pay | Admitting: *Deleted

## 2018-04-20 DIAGNOSIS — E860 Dehydration: Secondary | ICD-10-CM | POA: Insufficient documentation

## 2018-04-20 DIAGNOSIS — K529 Noninfective gastroenteritis and colitis, unspecified: Secondary | ICD-10-CM | POA: Insufficient documentation

## 2018-04-20 DIAGNOSIS — O26892 Other specified pregnancy related conditions, second trimester: Secondary | ICD-10-CM | POA: Diagnosis not present

## 2018-04-20 DIAGNOSIS — O212 Late vomiting of pregnancy: Secondary | ICD-10-CM | POA: Diagnosis present

## 2018-04-20 DIAGNOSIS — Z87891 Personal history of nicotine dependence: Secondary | ICD-10-CM | POA: Diagnosis not present

## 2018-04-20 DIAGNOSIS — Z3A25 25 weeks gestation of pregnancy: Secondary | ICD-10-CM | POA: Diagnosis not present

## 2018-04-20 DIAGNOSIS — Z3689 Encounter for other specified antenatal screening: Secondary | ICD-10-CM

## 2018-04-20 DIAGNOSIS — O99612 Diseases of the digestive system complicating pregnancy, second trimester: Secondary | ICD-10-CM | POA: Diagnosis not present

## 2018-04-20 LAB — COMPREHENSIVE METABOLIC PANEL
ALBUMIN: 3 g/dL — AB (ref 3.5–5.0)
ALK PHOS: 46 U/L (ref 38–126)
ALT: 26 U/L (ref 0–44)
AST: 18 U/L (ref 15–41)
Anion gap: 9 (ref 5–15)
BUN: 5 mg/dL — ABNORMAL LOW (ref 6–20)
CALCIUM: 8.5 mg/dL — AB (ref 8.9–10.3)
CHLORIDE: 104 mmol/L (ref 98–111)
CO2: 20 mmol/L — AB (ref 22–32)
CREATININE: 0.53 mg/dL (ref 0.44–1.00)
GFR calc non Af Amer: >60 mL/min (ref >60)
GLUCOSE: 74 mg/dL (ref 70–99)
Potassium: 3.5 mmol/L (ref 3.5–5.1)
SODIUM: 133 mmol/L — AB (ref 135–145)
Total Bilirubin: 0.7 mg/dL (ref 0.3–1.2)
Total Protein: 7 g/dL (ref 6.5–8.1)

## 2018-04-20 LAB — CBC
HCT: 35.6 % — ABNORMAL LOW (ref 36.0–46.0)
Hemoglobin: 11.5 g/dL — ABNORMAL LOW (ref 12.0–15.0)
MCH: 27.1 pg (ref 26.0–34.0)
MCHC: 32.3 g/dL (ref 30.0–36.0)
MCV: 84 fL (ref 80.0–100.0)
Platelets: 240 10*3/uL (ref 150–400)
RBC: 4.24 MIL/uL (ref 3.87–5.11)
RDW: 14.3 % (ref 11.5–15.5)
WBC: 7.9 10*3/uL (ref 4.0–10.5)
nRBC: 0 % (ref 0.0–0.2)

## 2018-04-20 LAB — URINALYSIS, ROUTINE W REFLEX MICROSCOPIC
Glucose, UA: NEGATIVE mg/dL
Leukocytes, UA: NEGATIVE
Nitrite: NEGATIVE
PROTEIN: NEGATIVE mg/dL
Specific Gravity, Urine: 1.025 (ref 1.005–1.030)
pH: 6 (ref 5.0–8.0)

## 2018-04-20 LAB — URINALYSIS, MICROSCOPIC (REFLEX)

## 2018-04-20 MED ORDER — ONDANSETRON 4 MG PO TBDP: 4.0000 mg | ORAL_TABLET | Freq: Three times a day (TID) | ORAL | 0 refills | Status: DC | PRN

## 2018-04-20 MED ORDER — SODIUM CHLORIDE 0.9 % IV SOLN
8.0000 mg | Freq: Once | INTRAVENOUS | Status: AC
  Administered 2018-04-20: 8 mg via INTRAVENOUS
  Filled 2018-04-20: qty 4

## 2018-04-20 MED ORDER — LACTATED RINGERS IV BOLUS
1000.0000 mL | Freq: Once | INTRAVENOUS | Status: AC
  Administered 2018-04-20: 1000 mL via INTRAVENOUS

## 2018-04-20 NOTE — MAU Note (Signed)
Vomiting and diarrhea past 2-3 days.  Vomiting every hour since the middle of night, getting worse.  Cramping since last night.

## 2018-04-20 NOTE — Discharge Instructions (Signed)
Viral Gastroenteritis, Adult    Viral gastroenteritis is also known as the stomach flu. This condition is caused by certain germs (viruses). These germs can be passed from person to person very easily (are very contagious). This condition can cause sudden watery poop (diarrhea), fever, and throwing up (vomiting).  Having watery poop and throwing up can make you feel weak and cause you to get dehydrated. Dehydration can make you tired and thirsty, make you have a dry mouth, and make it so you pee (urinate) less often. Older adults and people with other diseases or a weak defense system (immune system) are at higher risk for dehydration. It is important to replace the fluids that you lose from having watery poop and throwing up.  Follow these instructions at home:  Follow instructions from your doctor about how to care for yourself at home.  Eating and drinking  Follow these instructions as told by your doctor:   Take an oral rehydration solution (ORS). This is a drink that is sold at pharmacies and stores.   Drink clear fluids in small amounts as you are able, such as:  ? Water.  ? Ice chips.  ? Diluted fruit juice.  ? Low-calorie sports drinks.   Eat bland, easy-to-digest foods in small amounts as you are able, such as:  ? Bananas.  ? Applesauce.  ? Rice.  ? Low-fat (lean) meats.  ? Toast.  ? Crackers.   Avoid fluids that have a lot of sugar or caffeine in them.   Avoid alcohol.   Avoid spicy or fatty foods.  General instructions     Drink enough fluid to keep your pee (urine) clear or pale yellow.   Wash your hands often. If you cannot use soap and water, use hand sanitizer.   Make sure that all people in your home wash their hands well and often.   Rest at home while you get better.   Take over-the-counter and prescription medicines only as told by your doctor.   Watch your condition for any changes.   Take a warm bath to help with any burning or pain from having watery poop.   Keep all follow-up  visits as told by your doctor. This is important.  Contact a doctor if:   You cannot keep fluids down.   Your symptoms get worse.   You have new symptoms.   You feel light-headed or dizzy.   You have muscle cramps.  Get help right away if:   You have chest pain.   You feel very weak or you pass out (faint).   You see blood in your throw-up.   Your throw-up looks like coffee grounds.   You have bloody or black poop (stools) or poop that look like tar.   You have a very bad headache, a stiff neck, or both.   You have a rash.   You have very bad pain, cramping, or bloating in your belly (abdomen).   You have trouble breathing.   You are breathing very quickly.   Your heart is beating very quickly.   Your skin feels cold and clammy.   You feel confused.   You have pain when you pee.   You have signs of dehydration, such as:  ? Dark pee, hardly any pee, or no pee.  ? Cracked lips.  ? Dry mouth.  ? Sunken eyes.  ? Sleepiness.  ? Weakness.  This information is not intended to replace advice given to you by your   health care provider. Make sure you discuss any questions you have with your health care provider.  Document Released: 08/25/2007 Document Revised: 11/30/2017 Document Reviewed: 11/12/2014  Elsevier Interactive Patient Education  2019 [employer] Inc[employer]        Food Choices to Help Relieve Diarrhea, Adult  When you have diarrhea, the foods you eat and your eating habits are very important. Choosing the right foods and drinks can help:   Relieve diarrhea.   Replace lost fluids and nutrients.   Prevent dehydration.  What general guidelines should I follow?    Relieving diarrhea   Choose foods with less than 2 g or .07 oz. of fiber per serving.   Limit fats to less than 8 tsp (38 g or 1.34 oz.) a day.   Avoid the following:  ? Foods and beverages sweetened with high-fructose corn syrup, honey, or sugar alcohols such as xylitol, sorbitol, and mannitol.  ? Foods that contain a lot of fat or  sugar.  ? Fried, greasy, or spicy foods.  ? High-fiber grains, breads, and cereals.  ? Raw fruits and vegetables.   Eat foods that are rich in probiotics. These foods include dairy products such as yogurt and fermented milk products. They help increase healthy bacteria in the stomach and intestines (gastrointestinal tract, or GI tract).   If you have lactose intolerance, avoid dairy products. These may make your diarrhea worse.   Take medicine to help stop diarrhea (antidiarrheal medicine) only as told by your health care provider.  Replacing nutrients   Eat small meals or snacks every 3-4 hours.   Eat bland foods, such as white rice, toast, or baked potato, until your diarrhea starts to get better. Gradually reintroduce nutrient-rich foods as tolerated or as told by your health care provider. This includes:  ? Well-cooked protein foods.  ? Peeled, seeded, and soft-cooked fruits and vegetables.  ? Low-fat dairy products.   Take vitamin and mineral supplements as told by your health care provider.  Preventing dehydration   Start by sipping water or a special solution to prevent dehydration (oral rehydration solution, ORS). Urine that is clear or pale yellow means that you are getting enough fluid.   Try to drink at least 8-10 cups of fluid each day to help replace lost fluids.   You may add other liquids in addition to water, such as clear juice or decaffeinated sports drinks, as tolerated or as told by your health care provider.   Avoid drinks with caffeine, such as coffee, tea, or soft drinks.   Avoid alcohol.  What foods are recommended?         The items listed may not be a complete list. Talk with your health care provider about what dietary choices are best for you.  Grains  White rice. White, French, or pita breads (fresh or toasted), including plain rolls, buns, or bagels. White pasta. Saltine, soda, or graham crackers. Pretzels. Low-fiber cereal. Cooked cereals made with water (such as cornmeal,  farina, or cream cereals). Plain muffins. Matzo. Melba toast. Zwieback.  Vegetables  Potatoes (without the skin). Most well-cooked and canned vegetables without skins or seeds. Tender lettuce.  Fruits  Apple sauce. Fruits canned in juice. Cooked apricots, cherries, grapefruit, peaches, pears, or plums. Fresh bananas and cantaloupe.  Meats and other protein foods  Baked or boiled chicken. Eggs. Tofu. Fish. Seafood. Smooth nut butters. Ground or well-cooked tender beef, ham, veal, lamb, pork, or poultry.  Dairy  Plain yogurt, kefir, and unsweetened liquid   yogurt. Lactose-free milk, buttermilk, skim milk, or soy milk. Low-fat or nonfat hard cheese.  Beverages  Water. Low-calorie sports drinks. Fruit juices without pulp. Strained tomato and vegetable juices. Decaffeinated teas. Sugar-free beverages not sweetened with sugar alcohols. Oral rehydration solutions, if approved by your health care provider.  Seasoning and other foods  Bouillon, broth, or soups made from recommended foods.  What foods are not recommended?  The items listed may not be a complete list. Talk with your health care provider about what dietary choices are best for you.  Grains  Whole grain, whole wheat, bran, or rye breads, rolls, pastas, and crackers. Wild or brown rice. Whole grain or bran cereals. Barley. Oats and oatmeal. Corn tortillas or taco shells. Granola. Popcorn.  Vegetables  Raw vegetables. Fried vegetables. Cabbage, broccoli, Brussels sprouts, artichokes, baked beans, beet greens, corn, kale, legumes, peas, sweet potatoes, and yams. Potato skins. Cooked spinach and cabbage.  Fruits  Dried fruit, including raisins and dates. Raw fruits. Stewed or dried prunes. Canned fruits with syrup.  Meat and other protein foods  Fried or fatty meats. Deli meats. Chunky nut butters. Nuts and seeds. Beans and lentils. Bacon. Hot dogs. Sausage.  Dairy  High-fat cheeses. Whole milk, chocolate milk, and beverages made with milk, such as milk shakes.  Half-and-half. Cream. sour cream. Ice cream.  Beverages  Caffeinated beverages (such as coffee, tea, soda, or energy drinks). Alcoholic beverages. Fruit juices with pulp. Prune juice. Soft drinks sweetened with high-fructose corn syrup or sugar alcohols. High-calorie sports drinks.  Fats and oils  Butter. Cream sauces. Margarine. Salad oils. Plain salad dressings. Olives. Avocados. Mayonnaise.  Sweets and desserts  Sweet rolls, doughnuts, and sweet breads. Sugar-free desserts sweetened with sugar alcohols such as xylitol and sorbitol.  Seasoning and other foods  Honey. Hot sauce. Chili powder. Gravy. Cream-based or milk-based soups. Pancakes and waffles.  Summary   When you have diarrhea, the foods you eat and your eating habits are very important.   Make sure you get at least 8-10 cups of fluid each day, or enough to keep your urine clear or pale yellow.   Eat bland foods and gradually reintroduce healthy, nutrient-rich foods as tolerated, or as told by your health care provider.   Avoid high-fiber, fried, greasy, or spicy foods.  This information is not intended to replace advice given to you by your health care provider. Make sure you discuss any questions you have with your health care provider.  Document Released: 05/29/2003 Document Revised: 03/05/2016 Document Reviewed: 03/05/2016  Elsevier Interactive Patient Education  2019 [employer]

## 2018-04-20 NOTE — MAU Provider Note (Signed)
History     CSN: [id number]  Arrival date and time: 04/20/18 1153   First Provider Initiated Contact with Patient 04/20/18 1233      Chief Complaint  Patient presents with  . Abdominal Pain  . Emesis  . Diarrhea   G2P0101 @25 .4 wks presenting with N/V/D. Sx started 3 days ago. Unable to tolerate anything po since yesterday. Stools are watery. Her son had similar sx 3 days ago, only lasted 1 day. Having some upper abdominal cramping, intermittent, mostly with emesis. Denies VB, LOF, or ctx. Reports good FM.   OB History    Gravida  2   Para  1   Term  0   Preterm  1   AB      Living  1     SAB      TAB      Ectopic      Multiple      Live Births  1           Past Medical History:  Diagnosis Date  . Hx of chlamydia infection   . Infection    UTI  . Preterm labor     Past Surgical History:  Procedure Laterality Date  . CERVICAL CERCLAGE N/A 01/25/2018   Procedure: CERCLAGE CERVICAL;  Surgeon: Everett Graff, MD;  Location: [place];  Service: Gynecology;  Laterality: N/A;  . TONSILLECTOMY AND ADENOIDECTOMY    . WISDOM TOOTH EXTRACTION     all 4 teeth    Family History  Problem Relation Age of Onset  . Healthy Mother   . Hypertension Mother   . Healthy Father   . Healthy Sister   . Healthy Maternal Grandmother   . Hypertension Maternal Grandmother   . Healthy Paternal Grandmother   . Healthy Paternal Grandfather   . Healthy Sister   . Drug abuse Maternal Aunt   . Hypertension Maternal Aunt     Social History   Tobacco Use  . Smoking status: Former Smoker    Packs/day: 0.50    Types: Cigarettes    Last attempt to quit: 06/24/2016    Years since quitting: 1.8  . Smokeless tobacco: Never Used  . Tobacco comment: stopped since pregnant  Substance Use Topics  . Alcohol use: Not Currently    Comment: stopped with pregnancy  . Drug use: Not Currently    Allergies: No Known Allergies  Medications Prior to Admission   Medication Sig Dispense Refill Last Dose  . acetaminophen (TYLENOL) 500 MG tablet Take 500 mg by mouth every 6 (six) hours as needed for headache.   Taking  . metoCLOPramide (REGLAN) 10 MG tablet Take 1 tablet (10 mg total) by mouth every 8 (eight) hours as needed (headache). 30 tablet 0 Taking  . Prenatal Vit-Fe Fumarate-FA (MULTIVITAMIN-PRENATAL) 27-0.8 MG TABS tablet Take 1 tablet by mouth daily.   Taking    Review of Systems  Constitutional: Positive for chills. Negative for fever.  Gastrointestinal: Positive for abdominal pain, diarrhea, nausea and vomiting.  Genitourinary: Negative for vaginal bleeding and vaginal discharge.   Physical Exam   Blood pressure 122/79, pulse 97, temperature 98.1 F (36.7 C), temperature source Oral, resp. rate 18, weight 128.4 kg, last menstrual period 10/13/2017, SpO2 100 %, unknown if currently breastfeeding.  Physical Exam  Constitutional: She is oriented to person, place, and time. She appears well-developed and well-nourished. No distress.  HENT:  Head: Normocephalic and atraumatic.  Neck: Normal range of motion.  Cardiovascular: Normal rate.  Respiratory: Effort normal. No respiratory distress.  GI: Soft. She exhibits no distension. There is no abdominal tenderness.  gravid  Musculoskeletal: Normal range of motion.  Neurological: She is alert and oriented to person, place, and time.  Skin: Skin is warm and dry.  Psychiatric: She has a normal mood and affect.  EFM: 150 bpm, mod variability, + accels, no decels Toco: none  Results for orders placed or performed during the hospital encounter of 04/20/18 (from the past 24 hour(s))  Urinalysis, Routine w reflex microscopic     Status: Abnormal   Collection Time: 04/20/18 12:16 PM  Result Value Ref Range   Color, Urine YELLOW YELLOW   APPearance CLEAR CLEAR   Specific Gravity, Urine 1.025 1.005 - 1.030   pH 6.0 5.0 - 8.0   Glucose, UA NEGATIVE NEGATIVE mg/dL   Hgb urine dipstick TRACE (A)  NEGATIVE   Bilirubin Urine SMALL (A) NEGATIVE   Ketones, ur >80 (A) NEGATIVE mg/dL   Protein, ur NEGATIVE NEGATIVE mg/dL   Nitrite NEGATIVE NEGATIVE   Leukocytes, UA NEGATIVE NEGATIVE  Urinalysis, Microscopic (reflex)     Status: Abnormal   Collection Time: 04/20/18 12:16 PM  Result Value Ref Range   RBC / HPF 0-5 0 - 5 RBC/hpf   WBC, UA 0-5 0 - 5 WBC/hpf   Bacteria, UA FEW (A) NONE SEEN   Squamous Epithelial / LPF 0-5 0 - 5  CBC     Status: Abnormal   Collection Time: 04/20/18 12:51 PM  Result Value Ref Range   WBC 7.9 4.0 - 10.5 K/uL   RBC 4.24 3.87 - 5.11 MIL/uL   Hemoglobin 11.5 (L) 12.0 - 15.0 g/dL   HCT 35.6 (L) 36.0 - 46.0 %   MCV 84.0 80.0 - 100.0 fL   MCH 27.1 26.0 - 34.0 pg   MCHC 32.3 30.0 - 36.0 g/dL   RDW 14.3 11.5 - 15.5 %   Platelets 240 150 - 400 K/uL   nRBC 0.0 0.0 - 0.2 %  Comprehensive metabolic panel     Status: Abnormal   Collection Time: 04/20/18 12:51 PM  Result Value Ref Range   Sodium 133 (L) 135 - 145 mmol/L   Potassium 3.5 3.5 - 5.1 mmol/L   Chloride 104 98 - 111 mmol/L   CO2 20 (L) 22 - 32 mmol/L   Glucose, Bld 74 70 - 99 mg/dL   BUN 5 (L) 6 - 20 mg/dL   Creatinine, Ser 0.53 0.44 - 1.00 mg/dL   Calcium 8.5 (L) 8.9 - 10.3 mg/dL   Total Protein 7.0 6.5 - 8.1 g/dL   Albumin 3.0 (L) 3.5 - 5.0 g/dL   AST 18 15 - 41 U/L   ALT 26 0 - 44 U/L   Alkaline Phosphatase 46 38 - 126 U/L   Total Bilirubin 0.7 0.3 - 1.2 mg/dL   GFR calc non Af Amer >60 >60 mL/min   GFR calc Af Amer >60 >60 mL/min   Anion gap 9 5 - 15   MAU Course  Procedures Orders Placed This Encounter  Procedures  . Urinalysis, Routine w reflex microscopic    Standing Status:   Standing    Number of Occurrences:   1  . CBC    Standing Status:   Standing    Number of Occurrences:   1  . Comprehensive metabolic panel    Standing Status:   Standing    Number of Occurrences:   1  . Urinalysis, Microscopic (reflex)  Standing Status:   Standing    Number of Occurrences:   1  .  Contact Isolation: Enteric    Standing Status:   Standing    Number of Occurrences:   1  . Discharge patient    Order Specific Question:   Discharge disposition    Answer:   01-Home or Self Care [1]    Order Specific Question:   Discharge patient date    Answer:   04/20/2018   Meds ordered this encounter  Medications  . lactated ringers bolus 1,000 mL  . ondansetron (ZOFRAN) 8 mg in sodium chloride 0.9 % 50 mL IVPB  . ondansetron (ZOFRAN ODT) 4 MG disintegrating tablet    Sig: Take 1 tablet (4 mg total) by mouth every 8 (eight) hours as needed for nausea or vomiting.    Dispense:  20 tablet    Refill:  0    Order Specific Question:   Supervising Provider    Answer:   CONSTANT, [place] ordered and reviewed. Feeling much better. No emesis, tolerating po. Stable for discharge home.   Assessment and Plan   1. [redacted] weeks gestation of pregnancy   2. Gastroenteritis   3. Dehydration   4. NST (non-stress test) reactive    Discharge home Follow up in OB office as scheduled 1 week Maintain hydration Rx Zofran Imodium OTC prn  Allergies as of 04/20/2018   No Known Allergies     Medication List    STOP taking these medications   metoCLOPramide 10 MG tablet Commonly known as:  REGLAN     TAKE these medications   acetaminophen 500 MG tablet Commonly known as:  TYLENOL Take 500 mg by mouth every 6 (six) hours as needed for headache.   multivitamin-prenatal 27-0.8 MG Tabs tablet Take 1 tablet by mouth daily.   ondansetron 4 MG disintegrating tablet Commonly known as:  ZOFRAN ODT Take 1 tablet (4 mg total) by mouth every 8 (eight) hours as needed for nausea or vomiting.      Julianne Handler, CNM 04/20/2018, 2:39 PM

## 2018-04-26 ENCOUNTER — Other Ambulatory Visit: Payer: Self-pay | Admitting: *Deleted

## 2018-04-26 DIAGNOSIS — O3430 Maternal care for cervical incompetence, unspecified trimester: Secondary | ICD-10-CM

## 2018-04-26 DIAGNOSIS — O0990 Supervision of high risk pregnancy, unspecified, unspecified trimester: Secondary | ICD-10-CM

## 2018-04-27 ENCOUNTER — Encounter: Payer: Managed Care, Other (non HMO) | Admitting: Obstetrics and Gynecology

## 2018-04-27 ENCOUNTER — Other Ambulatory Visit: Payer: Managed Care, Other (non HMO)

## 2018-05-03 ENCOUNTER — Other Ambulatory Visit: Payer: Self-pay

## 2018-05-03 ENCOUNTER — Ambulatory Visit (INDEPENDENT_AMBULATORY_CARE_PROVIDER_SITE_OTHER): Payer: Managed Care, Other (non HMO) | Admitting: Obstetrics & Gynecology

## 2018-05-03 VITALS — BP 110/74 | HR 99 | Wt 283.0 lb

## 2018-05-03 DIAGNOSIS — O3432 Maternal care for cervical incompetence, second trimester: Secondary | ICD-10-CM

## 2018-05-03 DIAGNOSIS — O0990 Supervision of high risk pregnancy, unspecified, unspecified trimester: Secondary | ICD-10-CM

## 2018-05-03 DIAGNOSIS — Z8619 Personal history of other infectious and parasitic diseases: Secondary | ICD-10-CM

## 2018-05-03 DIAGNOSIS — O3430 Maternal care for cervical incompetence, unspecified trimester: Secondary | ICD-10-CM

## 2018-05-03 DIAGNOSIS — Z23 Encounter for immunization: Secondary | ICD-10-CM | POA: Diagnosis not present

## 2018-05-03 DIAGNOSIS — O0992 Supervision of high risk pregnancy, unspecified, second trimester: Secondary | ICD-10-CM

## 2018-05-03 DIAGNOSIS — Z3A27 27 weeks gestation of pregnancy: Secondary | ICD-10-CM

## 2018-05-03 NOTE — Progress Notes (Signed)
   PRENATAL VISIT NOTE  Subjective:  Annette Greene is a 28 y.o. G2P0101 at [redacted]w[redacted]d being seen today for ongoing prenatal care.  She is currently monitored for the following issues for this high-risk pregnancy and has Incompetent cervix in pregnancy, antepartum; Labor and delivery, indication for care; Premature delivery; Obesity, morbid ([place]); History of herpes simplex type 2 infection; Supervision of high risk pregnancy, antepartum; and Abdominal pain affecting pregnancy on their problem list.  Patient reports no complaints.  Contractions: Not present. Vag. Bleeding: None.  Movement: Present. Denies leaking of fluid.   The following portions of the patient's history were reviewed and updated as appropriate: allergies, current medications, past family history, past medical history, past social history, past surgical history and problem list. Problem list updated.  Objective:   Vitals:   05/03/18 0934  BP: 110/74  Pulse: 99  Weight: 283 lb (128.4 kg)    Fetal Status: Fetal Heart Rate (bpm): 155   Movement: Present     General:  Alert, oriented and cooperative. Patient is in no acute distress.  Skin: Skin is warm and dry. No rash noted.   Cardiovascular: Normal heart rate noted  Respiratory: Normal respiratory effort, no problems with respiration noted  Abdomen: Soft, gravid, appropriate for gestational age.  Pain/Pressure: Absent     Pelvic: Cervical exam deferred        Extremities: Normal range of motion.  Edema: None  Mental Status: Normal mood and affect. Normal behavior. Normal judgment and thought content.   Assessment and Plan:  Pregnancy: G2P0101 at [redacted]w[redacted]d  1. Supervision of high risk pregnancy, antepartum - 28 week labs, TDAP today  2. Incompetent cervix in pregnancy, antepartum - cerclage in place - she self- administers her weekly 17 P  3. Obesity, morbid ([place]) - rec no more weight gain - she is very hungry - does not want to see the nutritionist - is aware of the  risk of stillbirth  4. History of herpes simplex type 2 infection -treat with valtrex at about 34 weeks  Preterm labor symptoms and general obstetric precautions including but not limited to vaginal bleeding, contractions, leaking of fluid and fetal movement were reviewed in detail with the patient. Please refer to After Visit Summary for other counseling recommendations.  No follow-ups on file.  Future Appointments  Date Time Provider [place]  05/03/2018 10:00 AM WOC-WOCA LAB WOC-WOCA WOC  05/17/2018  9:45 AM WH-MFC [place] 5 WH-MFCUS MFC-US    Emily Filbert, MD

## 2018-05-03 NOTE — Addendum Note (Signed)
Addended by: Fidela Juneau A on: 05/03/2018 12:14 PM   Modules accepted: Orders

## 2018-05-04 LAB — GLUCOSE TOLERANCE, 2 HOURS W/ 1HR
GLUCOSE, 2 HOUR: 104 mg/dL (ref 65–152)
Glucose, 1 hour: 124 mg/dL (ref 65–179)
Glucose, Fasting: 77 mg/dL (ref 65–91)

## 2018-05-04 LAB — CBC
Hematocrit: 30.9 % — ABNORMAL LOW (ref 34.0–46.6)
Hemoglobin: 10.3 g/dL — ABNORMAL LOW (ref 11.1–15.9)
MCH: 27.6 pg (ref 26.6–33.0)
MCHC: 33.3 g/dL (ref 31.5–35.7)
MCV: 83 fL (ref 79–97)
Platelets: 269 10*3/uL (ref 150–450)
RBC: 3.73 x10E6/uL — ABNORMAL LOW (ref 3.77–5.28)
RDW: 13.8 % (ref 11.7–15.4)
WBC: 4.9 10*3/uL (ref 3.4–10.8)

## 2018-05-04 LAB — HIV ANTIBODY (ROUTINE TESTING W REFLEX): HIV Screen 4th Generation wRfx: NONREACTIVE

## 2018-05-04 LAB — RPR: RPR Ser Ql: NONREACTIVE

## 2018-05-17 ENCOUNTER — Encounter (HOSPITAL_COMMUNITY): Payer: Self-pay

## 2018-05-17 ENCOUNTER — Ambulatory Visit (HOSPITAL_COMMUNITY)
Admission: RE | Admit: 2018-05-17 | Discharge: 2018-05-17 | Disposition: A | Discharge status: Home / Self Care | Payer: Managed Care, Other (non HMO) | Source: Ambulatory Visit | Attending: Maternal & Fetal Medicine | Admitting: Maternal & Fetal Medicine

## 2018-05-17 ENCOUNTER — Ambulatory Visit (HOSPITAL_COMMUNITY): Payer: Managed Care, Other (non HMO)

## 2018-05-17 ENCOUNTER — Ambulatory Visit (HOSPITAL_COMMUNITY): Payer: Managed Care, Other (non HMO) | Admitting: *Deleted

## 2018-05-17 DIAGNOSIS — O99213 Obesity complicating pregnancy, third trimester: Secondary | ICD-10-CM | POA: Diagnosis not present

## 2018-05-17 DIAGNOSIS — O09219 Supervision of pregnancy with history of pre-term labor, unspecified trimester: Secondary | ICD-10-CM | POA: Insufficient documentation

## 2018-05-17 DIAGNOSIS — Z3A29 29 weeks gestation of pregnancy: Secondary | ICD-10-CM | POA: Diagnosis not present

## 2018-05-17 DIAGNOSIS — R109 Unspecified abdominal pain: Secondary | ICD-10-CM | POA: Diagnosis present

## 2018-05-17 DIAGNOSIS — Z362 Encounter for other antenatal screening follow-up: Secondary | ICD-10-CM

## 2018-05-17 DIAGNOSIS — O09213 Supervision of pregnancy with history of pre-term labor, third trimester: Secondary | ICD-10-CM | POA: Diagnosis not present

## 2018-05-17 DIAGNOSIS — O26899 Other specified pregnancy related conditions, unspecified trimester: Secondary | ICD-10-CM | POA: Insufficient documentation

## 2018-05-17 DIAGNOSIS — O3433 Maternal care for cervical incompetence, third trimester: Secondary | ICD-10-CM | POA: Diagnosis not present

## 2018-05-19 ENCOUNTER — Other Ambulatory Visit: Payer: Self-pay

## 2018-05-19 ENCOUNTER — Encounter: Payer: Self-pay | Admitting: Certified Nurse Midwife

## 2018-05-19 ENCOUNTER — Ambulatory Visit (INDEPENDENT_AMBULATORY_CARE_PROVIDER_SITE_OTHER): Payer: Medicaid Other | Admitting: Certified Nurse Midwife

## 2018-05-19 VITALS — BP 109/73 | HR 93 | Wt 287.3 lb

## 2018-05-19 DIAGNOSIS — Z3A29 29 weeks gestation of pregnancy: Secondary | ICD-10-CM

## 2018-05-19 DIAGNOSIS — O3430 Maternal care for cervical incompetence, unspecified trimester: Secondary | ICD-10-CM

## 2018-05-19 DIAGNOSIS — Z8619 Personal history of other infectious and parasitic diseases: Secondary | ICD-10-CM

## 2018-05-19 DIAGNOSIS — O0993 Supervision of high risk pregnancy, unspecified, third trimester: Secondary | ICD-10-CM

## 2018-05-19 DIAGNOSIS — O3433 Maternal care for cervical incompetence, third trimester: Secondary | ICD-10-CM

## 2018-05-19 DIAGNOSIS — O0990 Supervision of high risk pregnancy, unspecified, unspecified trimester: Secondary | ICD-10-CM

## 2018-05-19 NOTE — Patient Instructions (Addendum)
Considering Waterbirth? Guide for patients at [facility]  Why consider waterbirth?  . Gentle birth for babies . Less pain medicine used in labor . May allow for passive descent/less pushing . May reduce perineal tears  . More mobility and instinctive maternal position changes . Increased maternal relaxation . Reduced blood pressure in labor  Is waterbirth safe? What are the risks of infection, drowning or other complications?  . Infection: o Very low risk (3.7 % for tub vs 4.8% for bed) o 7 in 8000 waterbirths with documented infection o Poorly cleaned equipment most common cause o Slightly lower group B strep transmission rate  . Drowning o Maternal:  - Very low risk   - Related to seizures or fainting o Newborn:  - Very low risk. No evidence of increased risk of respiratory problems in multiple large studies - Physiological protection from breathing under water - Avoid underwater birth if there are any fetal complications - Once baby's head is out of the water, keep it out.  . Birth complication o Some reports of cord trauma, but risk decreased by bringing baby to surface gradually o No evidence of increased risk of shoulder dystocia. Mothers can usually change positions faster in water than in a bed, possibly aiding the maneuvers to free the shoulder.   You must attend a Doren Custard class at [facility]  3rd Wednesday of every month from 7-9pm  [employer] by calling [phone] or online at [email or web address]  Bring [place] the certificate from the class to your prenatal appointment  Meet with a midwife at 36 weeks to see if you can still plan a waterbirth and to sign the consent.   If you plan a waterbirth after May 14, 2018, at [facility], you will need to purchase the following:  Fish net  Bathing suit top (optional)  Long-handled mirror (optional)  Places to purchase or rent  supplies:   GotWeb[employer].is for tub purchases and supplies  Waterbirthsolutions.com for tub purchases and supplies  [employer] (www.thelaborladies.com) $275 for tub rental/set-up & take down/kit   [employer] (http://www.fleming.com/.htm) Information regarding doulas (labor support) who provide pool rentals  Things that would prevent you from having a waterbirth:  Premature, <37wks  Previous cesarean birth  Presence of thick meconium-stained fluid  Multiple gestation (Twins, triplets, etc.)  Uncontrolled diabetes or gestational diabetes requiring medication  Hypertension requiring medication or diagnosis of pre-eclampsia  Heavy vaginal bleeding  Non-reassuring fetal heart rate  Active infection (MRSA, etc.). Group B Strep is NOT a contraindication for waterbirth.  If your labor has to be induced and induction method requires continuous monitoring of the baby's heart rate  Other risks/issues identified by your obstetrical provider  Please remember that birth is unpredictable. Under certain unforeseeable circumstances your provider may advise against giving birth in the tub. These decisions will be made on a case-by-case basis and with the safety of you and your baby as our highest priority.

## 2018-05-19 NOTE — Progress Notes (Signed)
   PRENATAL VISIT NOTE  Subjective:  Annette Greene is a 28 y.o. G2P0101 at [redacted]w[redacted]d being seen today for ongoing prenatal care.  She is currently monitored for the following issues for this high-risk pregnancy and has Incompetent cervix in pregnancy, antepartum; Labor and delivery, indication for care; Premature delivery; Obesity, morbid ([place]); History of herpes simplex type 2 infection; Supervision of high risk pregnancy, antepartum; and Abdominal pain affecting pregnancy on their problem list.  Patient reports no complaints.  Contractions: Not present. Vag. Bleeding: None.  Movement: Present. Denies leaking of fluid.   The following portions of the patient's history were reviewed and updated as appropriate: allergies, current medications, past family history, past medical history, past social history, past surgical history and problem list. Problem list updated.  Objective:   Vitals:   05/19/18 1143  BP: 109/73  Pulse: 93  Weight: 287 lb 4.8 oz (130.3 kg)    Fetal Status: Fetal Heart Rate (bpm): 150   Movement: Present     General:  Alert, oriented and cooperative. Patient is in no acute distress.  Skin: Skin is warm and dry. No rash noted.   Cardiovascular: Normal heart rate noted  Respiratory: Normal respiratory effort, no problems with respiration noted  Abdomen: Soft, gravid, appropriate for gestational age.  Pain/Pressure: Absent     Pelvic: Cervical exam deferred        Extremities: Normal range of motion.  Edema: Trace  Mental Status: Normal mood and affect. Normal behavior. Normal judgment and thought content.   Assessment and Plan:  Pregnancy: G2P0101 at [redacted]w[redacted]d  1. Supervision of high risk pregnancy, antepartum - high risk pregnancy due to incompetent cervix otherwise low risk  - routine prenatal care  - anticipatory guidance on upcoming appointments - Patient interested in waterbirth, discussed with patient class and good mobility in and out of tub to agree to  waterbirth, patient verbalizes understanding  - Educated on exclusion criteria   2. History of herpes simplex type 2 infection - Suppression at 36 weeks   3. Incompetent cervix in pregnancy, antepartum - Removal of cerclage at 36 weeks   Preterm labor symptoms and general obstetric precautions including but not limited to vaginal bleeding, contractions, leaking of fluid and fetal movement were reviewed in detail with the patient. Please refer to After Visit Summary for other counseling recommendations.  Return in about 2 weeks (around 06/02/2018) for [place].  Future Appointments  Date Time Provider [place]  06/02/2018 10:35 AM Tamala Julian, [place], [place]    Lajean Manes, [place]

## 2018-05-22 ENCOUNTER — Other Ambulatory Visit (HOSPITAL_COMMUNITY): Payer: Self-pay | Admitting: *Deleted

## 2018-05-22 NOTE — Progress Notes (Unsigned)
Us/

## 2018-05-24 ENCOUNTER — Telehealth: Payer: Self-pay | Admitting: *Deleted

## 2018-05-24 NOTE — Telephone Encounter (Signed)
Patient called back, Questioning her Makena inj, patient want a call back today

## 2018-05-24 NOTE — Telephone Encounter (Signed)
Received a voice message from [place] stating want to schedule delivery of one complimentary dose of makena and that patient wants it shipped to her home. States they need to get authorization from nurse/ doctor.  I called to verify name and request and informed [facility]  I will need to discuss with provider as I do not see discussion re: 17p in her chart and I will call them back.

## 2018-05-24 NOTE — Telephone Encounter (Signed)
Called Annette Greene and Annette Greene informed me that she wanted to know when she would be getting her Darol Destine because her last dose was last Wednesday.  I explained to the Annette Greene that I understand that she has transferred her care.  However we need to speak with a provider to permit her to continue to administer her Makena at home.  I informed Annette Greene that we administer the medication here in the office.  Annette Greene stated that she would appreciate a call back tomorrow about her Makena.  I informed that someone will call her with an answer.

## 2018-05-25 NOTE — Telephone Encounter (Signed)
I discussed with Dr.Arnold and we reviewed chart- and he has approved she may receive at home since she has been doing that at the other prenatal practice she was seeing.  I called Annette Greene and we discussed that she was shown how to administer in her other office and she showed her grandmother how to administer and her Grandmother has been administering througtout her pregnancy. I informed her our provider has approved that she may continue to administer at home and that I will call [facility]. She states she also got a call from [employer]. I explained [facility] is sending her the complimentary dose and we may receive information that it will then be [facility] for the remaining.  I assured her we will complete any paperwork necessary. She voices understanding. I called [facility] and informed them her complimentary dose may be shipped to her home. They verified they are only sending the complimentary dose and Makena will assign whoever sends the remaining.

## 2018-05-26 ENCOUNTER — Telehealth: Payer: Self-pay

## 2018-05-26 NOTE — Telephone Encounter (Signed)
Pt called stating that she is having pain.  Pt reports that she is some irritation in the vagina and wanted to know if has something do with a cerclage.  Pt stated that she just started having the pain.  I advised pt to take Tylenol to see if that helps and if not we can see her in our after hours on Monday starting 4pm.  Pt agreed.

## 2018-05-31 ENCOUNTER — Telehealth: Payer: Self-pay | Admitting: Family Medicine

## 2018-05-31 ENCOUNTER — Other Ambulatory Visit: Payer: Self-pay

## 2018-05-31 ENCOUNTER — Ambulatory Visit (INDEPENDENT_AMBULATORY_CARE_PROVIDER_SITE_OTHER): Payer: Managed Care, Other (non HMO)

## 2018-05-31 VITALS — BP 121/70 | HR 90

## 2018-05-31 DIAGNOSIS — Z0130 Encounter for examination of blood pressure without abnormal findings: Secondary | ICD-10-CM

## 2018-05-31 MED ORDER — CYCLOBENZAPRINE HCL 10 MG PO TABS: 10.0000 mg | ORAL_TABLET | Freq: Three times a day (TID) | ORAL | 0 refills | Status: DC | PRN

## 2018-05-31 NOTE — Progress Notes (Unsigned)
Received message from [place] requesting a call to prior auth pt's Makena.  Contacted [phone] prior auth and spoke with Levada Dy who received approval for pt's Makena.  Prior authorization approval code is [id number] and prescription ends 07/08/18.  Levada Dy requested that the pt contact the pharmacy at [phone] to schedule shipment of medication.  Notiifed pt that she will need to contact her pharmacy to schedule.  Pt stated that she knows that she will call them tomorrow.  Pt stated that she had the pharmacy number as well.

## 2018-05-31 NOTE — Telephone Encounter (Signed)
Patient called office stating that her blood pressure has been high for about a week with constant headache. Pam advised that patient come in during our walk-in clinic to have her blood pressure checked. Patient verbalized understanding.

## 2018-05-31 NOTE — Progress Notes (Addendum)
Pt here today for BP check.  Pt BP 121/70.  Pt reports that she had a headache and went to [facility] and her BP there was 140/90's.  I asked if pt wanted to something else for the headaches because what she is currently taking is not effective.  Pt stated yes she would like something else.  Notified Terri B, NP pt's BP and request for headache medication.  Provider recommendation to continue to monitor for sx and Flexeril 10 mg q 8 hr PRN  prescribed for headaches.     I have reviewed the note and agree with the plan of care.  Earlie Server, RN, MSN, NP-BC Nurse Practitioner, [facility] for [employer], [employer] 06/01/2018 7:08 PM

## 2018-06-02 ENCOUNTER — Other Ambulatory Visit: Payer: Self-pay

## 2018-06-02 ENCOUNTER — Ambulatory Visit (INDEPENDENT_AMBULATORY_CARE_PROVIDER_SITE_OTHER): Payer: Managed Care, Other (non HMO) | Admitting: Advanced Practice Midwife

## 2018-06-02 VITALS — BP 108/67 | HR 98 | Wt 283.6 lb

## 2018-06-02 DIAGNOSIS — B977 Papillomavirus as the cause of diseases classified elsewhere: Secondary | ICD-10-CM | POA: Insufficient documentation

## 2018-06-02 DIAGNOSIS — O3433 Maternal care for cervical incompetence, third trimester: Secondary | ICD-10-CM

## 2018-06-02 DIAGNOSIS — O0993 Supervision of high risk pregnancy, unspecified, third trimester: Secondary | ICD-10-CM

## 2018-06-02 DIAGNOSIS — Z8619 Personal history of other infectious and parasitic diseases: Secondary | ICD-10-CM

## 2018-06-02 DIAGNOSIS — N926 Irregular menstruation, unspecified: Secondary | ICD-10-CM | POA: Insufficient documentation

## 2018-06-02 DIAGNOSIS — Z3A31 31 weeks gestation of pregnancy: Secondary | ICD-10-CM

## 2018-06-02 DIAGNOSIS — O0990 Supervision of high risk pregnancy, unspecified, unspecified trimester: Secondary | ICD-10-CM

## 2018-06-02 DIAGNOSIS — O3430 Maternal care for cervical incompetence, unspecified trimester: Secondary | ICD-10-CM

## 2018-06-02 MED ORDER — VALACYCLOVIR HCL 500 MG PO TABS: 500.0000 mg | ORAL_TABLET | Freq: Two times a day (BID) | ORAL | 6 refills | Status: DC

## 2018-06-02 NOTE — Patient Instructions (Addendum)
[email or web address]   Braxton Hicks Contractions Contractions of the uterus can occur throughout pregnancy, but they are not always a sign that you are in labor. You may have practice contractions called Braxton Hicks contractions. These false labor contractions are sometimes confused with true labor. What are [place] contractions? Braxton Hicks contractions are tightening movements that occur in the muscles of the uterus before labor. Unlike true labor contractions, these contractions do not result in opening (dilation) and thinning of the cervix. Toward the end of pregnancy (32-34 weeks), Braxton Hicks contractions can happen more often and may become stronger. These contractions are sometimes difficult to tell apart from true labor because they can be very uncomfortable. You should not feel embarrassed if you go to the hospital with false labor. Sometimes, the only way to tell if you are in true labor is for your health care provider to look for changes in the cervix. The health care provider will do a physical exam and may monitor your contractions. If you are not in true labor, the exam should show that your cervix is not dilating and your water has not broken. If there are no other health problems associated with your pregnancy, it is completely safe for you to be sent home with false labor. You may continue to have Braxton Hicks contractions until you go into true labor. How to tell the difference between true labor and false labor True labor  Contractions last 30-70 seconds.  Contractions become very regular.  Discomfort is usually felt in the top of the uterus, and it spreads to the lower abdomen and low back.  Contractions do not go away with walking.  Contractions usually become more intense and increase in frequency.  The cervix dilates and gets thinner. False labor  Contractions are usually shorter and not as strong as true labor contractions.  Contractions are  usually irregular.  Contractions are often felt in the front of the lower abdomen and in the groin.  Contractions may go away when you walk around or change positions while lying down.  Contractions get weaker and are shorter-lasting as time goes on.  The cervix usually does not dilate or become thin. Follow these instructions at home:   Take over-the-counter and prescription medicines only as told by your health care provider.  Keep up with your usual exercises and follow other instructions from your health care provider.  Eat and drink lightly if you think you are going into labor.  If Braxton Hicks contractions are making you uncomfortable: ? Change your position from lying down or resting to walking, or change from walking to resting. ? Sit and rest in a tub of warm water. ? Drink enough fluid to keep your urine pale yellow. Dehydration may cause these contractions. ? Do slow and deep breathing several times an hour.  Keep all follow-up prenatal visits as told by your health care provider. This is important. Contact a health care provider if:  You have a fever.  You have continuous pain in your abdomen. Get help right away if:  Your contractions become stronger, more regular, and closer together.  You have fluid leaking or gushing from your vagina.  You pass blood-tinged mucus (bloody show).  You have bleeding from your vagina.  You have low back pain that you never had before.  You feel your baby's head pushing down and causing pelvic pressure.  Your baby is not moving inside you as much as it used to. Summary  Contractions that occur  before labor are called Braxton Hicks contractions, false labor, or practice contractions.  Braxton Hicks contractions are usually shorter, weaker, farther apart, and less regular than true labor contractions. True labor contractions usually become progressively stronger and regular, and they become more frequent.  Manage  discomfort from [facility] contractions by changing position, resting in a warm bath, drinking plenty of water, or practicing deep breathing. This information is not intended to replace advice given to you by your health care provider. Make sure you discuss any questions you have with your health care provider. Document Released: 07/22/2016 Document Revised: 12/21/2016 Document Reviewed: 07/22/2016 Elsevier Interactive Patient Education  2019 [employer].

## 2018-06-02 NOTE — Progress Notes (Unsigned)
Pt advised that she was told by Pharmacy when she was trying to get her Makena sent to her using her new insurance, she was told that she has a $200 CO-pay. Pt states there is no way that she would be able to pay that. Due to her switching ins. Last week, she was able to get a courtesy Injection on 05/26/18. So I was advised to speak with our care mngr. Derdlim [phone] ext: [phone]. She was already aware of the situation & states that Medicaid should be able to cover her co-pay, pt should get another courtesy injection from pharmacy today. Also Derdlim stated that she will also call Pt directly to inform her of everything that's going on. Pt verbalized understanding.

## 2018-06-02 NOTE — Progress Notes (Signed)
   PRENATAL VISIT NOTE  Subjective:  Annette Greene is a 28 y.o. G2P0101 at [redacted]w[redacted]d being seen today for ongoing prenatal care.  She is currently monitored for the following issues for this high-risk pregnancy and has Incompetent cervix in pregnancy, antepartum; Premature delivery; Obesity, morbid ([place]); History of herpes simplex type 2 infection; Supervision of high risk pregnancy, antepartum; Abdominal pain affecting pregnancy; Human papilloma virus infection; Irregular periods; and Cyst of ovary on their problem list.  Patient reports no complaints.  Contractions: Not present. Vag. Bleeding: None.  Movement: Present. Denies leaking of fluid.   The following portions of the patient's history were reviewed and updated as appropriate: allergies, current medications, past family history, past medical history, past social history, past surgical history and problem list.   Objective:   Vitals:   06/02/18 1113  BP: 108/67  Pulse: 98  Weight: 283 lb 9.6 oz (128.6 kg)    Fetal Status: Fetal Heart Rate (bpm): 140 Fundal Height: 34 cm Movement: Present     General:  Alert, oriented and cooperative. Patient is in no acute distress.  Skin: Skin is warm and dry. No rash noted.   Cardiovascular: Normal heart rate noted  Respiratory: Normal respiratory effort, no problems with respiration noted  Abdomen: Soft, gravid, appropriate for gestational age.  Pain/Pressure: Absent     Pelvic: Cervical exam deferred        Extremities: Normal range of motion.  Edema: None  Mental Status: Normal mood and affect. Normal behavior. Normal judgment and thought content.   Assessment and Plan:  Pregnancy: G2P0101 at [redacted]w[redacted]d 1. Incompetent cervix in pregnancy, antepartum - Cerclage in place - Continue 17-P (RN to troubleshoot insurance problem). Able to obtain emergency dose for pt this week.   2. History of herpes simplex type 2 infection - Valtrex at 36 weeks  3. Supervision of high risk pregnancy, antepartum  - Asking about waterbirth. Discussed that pt MUST be at least 37 weeks for waterbirth. Tubs are first-come-first-served. Will look into classes.   Preterm labor symptoms and general obstetric precautions including but not limited to vaginal bleeding, contractions, leaking of fluid and fetal movement were reviewed in detail with the patient. Please refer to After Visit Summary for other counseling recommendations.   Return in about 2 weeks (around 06/16/2018) for ROB.  Future Appointments  Date Time Provider [place]  06/14/2018 10:30 AM [place] [facility]  06/14/2018 10:45 AM [facility] [place] [facility]    Aalijah Mims, [place]

## 2018-06-05 ENCOUNTER — Telehealth: Payer: Self-pay | Admitting: *Deleted

## 2018-06-05 NOTE — Telephone Encounter (Signed)
Received a message from registrar that Annette Greene is calling about her shot - she didn't get it sent to her home. Per chart was supposed to have been sent out by now. I called Annette Greene and left a message I am returning her call and I will reach out to the pharmacy- to call us back if she doesn't receive her medication.  I called Annette Greene and left a message for our rep Annette Greene that I am calliing because Annette Greene has not received her injection to her home and asked for a call back.

## 2018-06-05 NOTE — Telephone Encounter (Signed)
Annette Greene called back and states she can't pay the $200 copay - I explained we have reached out to [facility] and are waiting to hear back what we need to do- she states she was told she may need to fill out paperwork. I explained once we hear back we will call.

## 2018-06-07 NOTE — Telephone Encounter (Signed)
Called Derlin @ [phone] ext [phone] and Derlin informed me that the courtesy dose was suppose to have been sent yesterday to pt's home.  Derlin also informed me that the pharmacy is still reviewing to see if pt's secondary insurance, Medicaid, will cover the difference.  However with coronavirus all the insurance customer services are having long delay.  Derlin gave the okay for the pt to call her directly to see if she can receive the copay assistance.    Called pt and verified pt if she has received her makena.  Pt stated that she did receive her medication yesterday.  I informed pt that Derlin okayed her to call her directly for copay assistance.  Pt given Derlin information.  Pt stated that she will call her.

## 2018-06-14 ENCOUNTER — Other Ambulatory Visit: Payer: Self-pay

## 2018-06-14 ENCOUNTER — Ambulatory Visit (HOSPITAL_COMMUNITY)
Admission: RE | Admit: 2018-06-14 | Discharge: 2018-06-14 | Disposition: A | Discharge status: Home / Self Care | Payer: Managed Care, Other (non HMO) | Source: Ambulatory Visit | Attending: Obstetrics and Gynecology | Admitting: Obstetrics and Gynecology

## 2018-06-14 ENCOUNTER — Ambulatory Visit (INDEPENDENT_AMBULATORY_CARE_PROVIDER_SITE_OTHER): Payer: Managed Care, Other (non HMO) | Admitting: Nurse Practitioner

## 2018-06-14 ENCOUNTER — Ambulatory Visit (HOSPITAL_COMMUNITY): Payer: Managed Care, Other (non HMO) | Admitting: *Deleted

## 2018-06-14 ENCOUNTER — Encounter (HOSPITAL_COMMUNITY): Payer: Self-pay

## 2018-06-14 VITALS — BP 118/72 | HR 92 | Temp 98.1°F | Wt 285.2 lb

## 2018-06-14 VITALS — BP 114/66 | HR 103 | Temp 98.1°F

## 2018-06-14 DIAGNOSIS — O99213 Obesity complicating pregnancy, third trimester: Secondary | ICD-10-CM

## 2018-06-14 DIAGNOSIS — O3433 Maternal care for cervical incompetence, third trimester: Secondary | ICD-10-CM

## 2018-06-14 DIAGNOSIS — O0990 Supervision of high risk pregnancy, unspecified, unspecified trimester: Secondary | ICD-10-CM

## 2018-06-14 DIAGNOSIS — Z362 Encounter for other antenatal screening follow-up: Secondary | ICD-10-CM

## 2018-06-14 DIAGNOSIS — O3430 Maternal care for cervical incompetence, unspecified trimester: Secondary | ICD-10-CM

## 2018-06-14 DIAGNOSIS — Z3A33 33 weeks gestation of pregnancy: Secondary | ICD-10-CM

## 2018-06-14 DIAGNOSIS — O09213 Supervision of pregnancy with history of pre-term labor, third trimester: Secondary | ICD-10-CM | POA: Diagnosis not present

## 2018-06-14 DIAGNOSIS — O0993 Supervision of high risk pregnancy, unspecified, third trimester: Secondary | ICD-10-CM

## 2018-06-14 NOTE — Progress Notes (Signed)
    Subjective:  Annette Greene is a 28 y.o. G2P0101 at [redacted]w[redacted]d being seen today for ongoing prenatal care.  She is currently monitored for the following issues for this high-risk pregnancy and has Incompetent cervix in pregnancy, antepartum; Premature delivery; Obesity, morbid ([place]); History of herpes simplex type 2 infection; Supervision of high risk pregnancy, antepartum; Abdominal pain affecting pregnancy; Human papilloma virus infection; Irregular periods; and Cyst of ovary on their problem list.  Patient reports no complaints.  Contractions: Not present. Vag. Bleeding: None.  Movement: Present. Denies leaking of fluid.   The following portions of the patient's history were reviewed and updated as appropriate: allergies, current medications, past family history, past medical history, past social history, past surgical history and problem list. Problem list updated.  Objective:   Vitals:   06/14/18 0855  BP: 118/72  Pulse: 92  Temp: 98.1 F (36.7 C)  Weight: 285 lb 3.2 oz (129.4 kg)    Fetal Status: Fetal Heart Rate (bpm): 155 Fundal Height: 38 cm Movement: Present     General:  Alert, oriented and cooperative. Patient is in no acute distress.  Skin: Skin is warm and dry. No rash noted.   Cardiovascular: Normal heart rate noted  Respiratory: Normal respiratory effort, no problems with respiration noted  Abdomen: Soft, gravid, appropriate for gestational age. Pain/Pressure: Absent     Pelvic:  Cervical exam deferred        Extremities: Normal range of motion.  Edema: None  Mental Status: Normal mood and affect. Normal behavior. Normal judgment and thought content.   Urinalysis:      Assessment and Plan:  Pregnancy: G2P0101 at [redacted]w[redacted]d  1. Supervision of high risk pregnancy, antepartum Measuring size greater than dates but has appointment today for an ultrasound.   Instructed to take BP weekly and will call the office if she is having any problems.  Will also check her weight  weekly on her home scales. Will see again in the clinic at 36 weeks. Continuing to get 17P injections at home. Still planning a water birth and is scheduled for class in April - advised her to expect to have an online class to view rather than an in person class. Doing very well and is very informed on strategies to avoid Covid19  States she has MyChart app and knows how to use it.  - [facility] BABYSCRIPTS OPT IN  2. Incompetent cervix in pregnancy, antepartum Has cerclage - for MD visit at 36 weeks to remove cerclage and get vaginal swabs.  Preterm labor symptoms and general obstetric precautions including but not limited to vaginal bleeding, contractions, leaking of fluid and fetal movement were reviewed in detail with the patient. Please refer to After Visit Summary for other counseling recommendations.  Return in about 19 days (around 07/03/2018) for needs MD appointment. for cerclage removal.  Earlie Server, RN, MSN, NP-BC Nurse Practitioner, [facility] for [employer], [place] [facility] 06/14/2018 9:21 AM

## 2018-06-14 NOTE — Patient Instructions (Signed)
Continue all precautions at home as you have been doing. Start the Valtrex

## 2018-06-14 NOTE — Progress Notes (Signed)
Pt states has been having sharpe pains in stomach starting yesterday all day .

## 2018-06-19 ENCOUNTER — Telehealth: Payer: Self-pay | Admitting: *Deleted

## 2018-06-19 NOTE — Telephone Encounter (Signed)
Annette Greene called and left a message this am she is calling about her cerclage.  I called Annette Greene and she wanted to know when her next appt is and if her cerclage is being removed that day. I informed her that her next appt is 07/03/18 with Dr. Ilda Basset and usually cerclage is removed at 36 weeks and she will be 36 weeks then. We discussed is usually at 36 weeks but some providers at 37 weeks. She voices understanding.

## 2018-06-22 ENCOUNTER — Telehealth: Payer: Self-pay | Admitting: Family Medicine

## 2018-06-22 NOTE — Telephone Encounter (Signed)
The patient called to reschedule the appointment for to 4/20 as she stated she is not available on the date of the 13th.

## 2018-06-23 ENCOUNTER — Telehealth: Payer: Self-pay

## 2018-06-23 ENCOUNTER — Other Ambulatory Visit: Payer: Self-pay

## 2018-06-23 ENCOUNTER — Encounter (HOSPITAL_COMMUNITY): Payer: Self-pay | Admitting: *Deleted

## 2018-06-23 ENCOUNTER — Inpatient Hospital Stay (HOSPITAL_COMMUNITY)
Admission: AD | Admit: 2018-06-23 | Discharge: 2018-06-23 | Disposition: A | Discharge status: Home / Self Care | Payer: Managed Care, Other (non HMO) | Attending: Obstetrics & Gynecology | Admitting: Obstetrics & Gynecology

## 2018-06-23 DIAGNOSIS — Z3A34 34 weeks gestation of pregnancy: Secondary | ICD-10-CM | POA: Diagnosis not present

## 2018-06-23 DIAGNOSIS — Z3689 Encounter for other specified antenatal screening: Secondary | ICD-10-CM | POA: Diagnosis not present

## 2018-06-23 DIAGNOSIS — Z87891 Personal history of nicotine dependence: Secondary | ICD-10-CM | POA: Diagnosis not present

## 2018-06-23 DIAGNOSIS — R109 Unspecified abdominal pain: Secondary | ICD-10-CM | POA: Diagnosis not present

## 2018-06-23 DIAGNOSIS — O4703 False labor before 37 completed weeks of gestation, third trimester: Secondary | ICD-10-CM | POA: Diagnosis not present

## 2018-06-23 DIAGNOSIS — O26893 Other specified pregnancy related conditions, third trimester: Secondary | ICD-10-CM | POA: Diagnosis not present

## 2018-06-23 HISTORY — DX: Herpesviral infection, unspecified: B00.9

## 2018-06-23 LAB — WET PREP, GENITAL
Clue Cells Wet Prep HPF POC: NONE SEEN
Sperm: NONE SEEN
Trich, Wet Prep: NONE SEEN
Yeast Wet Prep HPF POC: NONE SEEN

## 2018-06-23 LAB — URINALYSIS, ROUTINE W REFLEX MICROSCOPIC
Bilirubin Urine: NEGATIVE
Glucose, UA: NEGATIVE mg/dL
Hgb urine dipstick: NEGATIVE
Ketones, ur: NEGATIVE mg/dL
Leukocytes,Ua: NEGATIVE
Nitrite: NEGATIVE
Protein, ur: NEGATIVE mg/dL
Specific Gravity, Urine: 1.018 (ref 1.005–1.030)
pH: 6 (ref 5.0–8.0)

## 2018-06-23 LAB — OB RESULTS CONSOLE GC/CHLAMYDIA: Gonorrhea: NEGATIVE

## 2018-06-23 NOTE — MAU Provider Note (Signed)
History     CSN: [id number]  Arrival date and time: 06/23/18 1306   First Provider Initiated Contact with Patient 06/23/18 1420      Chief Complaint  Patient presents with  . Abdominal Pain   G2P0101 @34 .5 wks presenting with ctx. Reports ctx started 1 week ago but became worse this am. Frequency was q3 min initially then became q5-7 min. Denies VB or LOF. Denies vaginal discharge. On pelvic rest d/t cerclage. Reports good FM.    OB History    Gravida  2   Para  1   Term  0   Preterm  1   AB      Living  1     SAB      TAB      Ectopic      Multiple      Live Births  1           Past Medical History:  Diagnosis Date  . Herpes   . Hx of chlamydia infection   . Infection    UTI  . Preterm labor     Past Surgical History:  Procedure Laterality Date  . CERVICAL CERCLAGE N/A 01/25/2018   Procedure: CERCLAGE CERVICAL;  Surgeon: Everett Graff, MD;  Location: [place];  Service: Gynecology;  Laterality: N/A;  . TONSILLECTOMY AND ADENOIDECTOMY    . WISDOM TOOTH EXTRACTION     all 4 teeth    Family History  Problem Relation Age of Onset  . Healthy Mother   . Hypertension Mother   . Healthy Father   . Healthy Sister   . Healthy Maternal Grandmother   . Hypertension Maternal Grandmother   . Healthy Paternal Grandmother   . Healthy Paternal Grandfather   . Healthy Sister   . Drug abuse Maternal Aunt   . Hypertension Maternal Aunt     Social History   Tobacco Use  . Smoking status: Former Smoker    Packs/day: 0.50    Types: Cigarettes    Last attempt to quit: 06/24/2016    Years since quitting: 1.9  . Smokeless tobacco: Never Used  . Tobacco comment: stopped since pregnant  Substance Use Topics  . Alcohol use: Not Currently    Comment: stopped with pregnancy  . Drug use: Not Currently    Allergies: No Known Allergies  Medications Prior to Admission  Medication Sig Dispense Refill Last Dose  . acetaminophen (TYLENOL) 500 MG  tablet Take 500 mg by mouth every 6 (six) hours as needed for headache.   Past Month at Unknown time  . MAKENA 275 MG/1.1ML SOAJ Inject 275 mg into the muscle once a week.   Past Week at Unknown time  . ondansetron (ZOFRAN ODT) 4 MG disintegrating tablet Take 1 tablet (4 mg total) by mouth every 8 (eight) hours as needed for nausea or vomiting. 20 tablet 0 Past Month at Unknown time  . Prenatal Vit-Fe Fumarate-FA (MULTIVITAMIN-PRENATAL) 27-0.8 MG TABS tablet Take 1 tablet by mouth daily.   06/23/2018 at Unknown time  . valACYclovir (VALTREX) 500 MG tablet Take 1 tablet (500 mg total) by mouth 2 (two) times daily. (Patient not taking: Reported on 06/14/2018) 30 tablet 6 Unknown at Unknown time    Review of Systems  Gastrointestinal: Positive for abdominal pain.  Genitourinary: Negative for vaginal bleeding and vaginal discharge.   Physical Exam   Blood pressure 130/70, pulse 73, temperature 99 F (37.2 C), resp. rate 18, weight 132.5 kg, last menstrual period 10/13/2017,  SpO2 100 %, unknown if currently breastfeeding.  Physical Exam  Nursing note and vitals reviewed. Constitutional: She is oriented to person, place, and time. She appears well-developed and well-nourished. No distress.  HENT:  Head: Normocephalic and atraumatic.  Neck: Normal range of motion.  Cardiovascular: Normal rate.  Respiratory: Effort normal. No respiratory distress.  GI: Soft. She exhibits no distension. There is no abdominal tenderness.  gravid  Genitourinary:    Genitourinary Comments: SSE: cerclage not visualized SVE: closed, ?effacement, cerclage palpated   Musculoskeletal: Normal range of motion.  Neurological: She is alert and oriented to person, place, and time.  Skin: Skin is warm and dry.  Psychiatric: She has a normal mood and affect.  EFM: 145 bpm, mod variability, + accels, no decels Toco: rare, irritability  Results for orders placed or performed during the hospital encounter of 06/23/18 (from the  past 24 hour(s))  Urinalysis, Routine w reflex microscopic     Status: None   Collection Time: 06/23/18  2:38 PM  Result Value Ref Range   Color, Urine YELLOW YELLOW   APPearance CLEAR CLEAR   Specific Gravity, Urine 1.018 1.005 - 1.030   pH 6.0 5.0 - 8.0   Glucose, UA NEGATIVE NEGATIVE mg/dL   Hgb urine dipstick NEGATIVE NEGATIVE   Bilirubin Urine NEGATIVE NEGATIVE   Ketones, ur NEGATIVE NEGATIVE mg/dL   Protein, ur NEGATIVE NEGATIVE mg/dL   Nitrite NEGATIVE NEGATIVE   Leukocytes,Ua NEGATIVE NEGATIVE  Wet prep, genital     Status: Abnormal   Collection Time: 06/23/18  2:38 PM  Result Value Ref Range   Yeast Wet Prep HPF POC NONE SEEN NONE SEEN   Trich, Wet Prep NONE SEEN NONE SEEN   Clue Cells Wet Prep HPF POC NONE SEEN NONE SEEN   WBC, Wet Prep HPF POC MANY (A) NONE SEEN   Sperm NONE SEEN    MAU Course  Procedures  MDM Labs ordered and reviewed. Reports decrease in ctx since arrival to MAU. 1530: Not feeling any ctx now.  Stable for discharge home  Assessment and Plan   1. [redacted] weeks gestation of pregnancy   2. NST (non-stress test) reactive   3. Preterm uterine contractions, antepartum, third trimester    Discharge home [facility] as scheduled PTL precautions  Allergies as of 06/23/2018   No Known Allergies     Medication List    TAKE these medications   acetaminophen 500 MG tablet Commonly known as:  TYLENOL Take 500 mg by mouth every 6 (six) hours as needed for headache.   Makena 275 MG/1.1ML Soaj Generic drug:  HYDROXYprogesterone Caproate Inject 275 mg into the muscle once a week.   multivitamin-prenatal 27-0.8 MG Tabs tablet Take 1 tablet by mouth daily.   ondansetron 4 MG disintegrating tablet Commonly known as:  Zofran ODT Take 1 tablet (4 mg total) by mouth every 8 (eight) hours as needed for nausea or vomiting.   valACYclovir 500 MG tablet Commonly known as:  VALTREX Take 1 tablet (500 mg total) by mouth 2 (two) times daily.       Julianne Handler, CNM 06/23/2018, 3:38 PM

## 2018-06-23 NOTE — MAU Note (Signed)
Pt presents to MAU with complaints of abdominal cramping. Had previous 26 week delivery, had a cerclage placed at 14 weeks, Denies any vaginal bleeding

## 2018-06-23 NOTE — Discharge Instructions (Signed)
Braxton Hicks Contractions Contractions of the uterus can occur throughout pregnancy, but they are not always a sign that you are in labor. You may have practice contractions called Braxton Hicks contractions. These false labor contractions are sometimes confused with true labor. What are Braxton Hicks contractions? Braxton Hicks contractions are tightening movements that occur in the muscles of the uterus before labor. Unlike true labor contractions, these contractions do not result in opening (dilation) and thinning of the cervix. Toward the end of pregnancy (32-34 weeks), Braxton Hicks contractions can happen more often and may become stronger. These contractions are sometimes difficult to tell apart from true labor because they can be very uncomfortable. You should not feel embarrassed if you go to the hospital with false labor. Sometimes, the only way to tell if you are in true labor is for your health care provider to look for changes in the cervix. The health care provider will do a physical exam and may monitor your contractions. If you are not in true labor, the exam should show that your cervix is not dilating and your water has not broken. If there are no other health problems associated with your pregnancy, it is completely safe for you to be sent home with false labor. You may continue to have Braxton Hicks contractions until you go into true labor. How to tell the difference between true labor and false labor True labor  Contractions last 30-70 seconds.  Contractions become very regular.  Discomfort is usually felt in the top of the uterus, and it spreads to the lower abdomen and low back.  Contractions do not go away with walking.  Contractions usually become more intense and increase in frequency.  The cervix dilates and gets thinner. False labor  Contractions are usually shorter and not as strong as true labor contractions.  Contractions are usually irregular.  Contractions  are often felt in the front of the lower abdomen and in the groin.  Contractions may go away when you walk around or change positions while lying down.  Contractions get weaker and are shorter-lasting as time goes on.  The cervix usually does not dilate or become thin. Follow these instructions at home:   Take over-the-counter and prescription medicines only as told by your health care provider.  Keep up with your usual exercises and follow other instructions from your health care provider.  Eat and drink lightly if you think you are going into labor.  If Braxton Hicks contractions are making you uncomfortable: ? Change your position from lying down or resting to walking, or change from walking to resting. ? Sit and rest in a tub of warm water. ? Drink enough fluid to keep your urine pale yellow. Dehydration may cause these contractions. ? Do slow and deep breathing several times an hour.  Keep all follow-up prenatal visits as told by your health care provider. This is important. Contact a health care provider if:  You have a fever.  You have continuous pain in your abdomen. Get help right away if:  Your contractions become stronger, more regular, and closer together.  You have fluid leaking or gushing from your vagina.  You pass blood-tinged mucus (bloody show).  You have bleeding from your vagina.  You have low back pain that you never had before.  You feel your baby's head pushing down and causing pelvic pressure.  Your baby is not moving inside you as much as it used to. Summary  Contractions that occur before labor are   called Braxton Hicks contractions, false labor, or practice contractions.  Braxton Hicks contractions are usually shorter, weaker, farther apart, and less regular than true labor contractions. True labor contractions usually become progressively stronger and regular, and they become more frequent.  Manage discomfort from Braxton Hicks contractions  by changing position, resting in a warm bath, drinking plenty of water, or practicing deep breathing. This information is not intended to replace advice given to you by your health care provider. Make sure you discuss any questions you have with your health care provider. Document Released: 07/22/2016 Document Revised: 12/21/2016 Document Reviewed: 07/22/2016 Elsevier Interactive Patient Education  2019 [employer]  

## 2018-06-23 NOTE — Telephone Encounter (Signed)
Pt called stating that she is having a lot of contractions.  Called pt and pt informed me that she just walked into MAU.  I informed pt that I would have them evaluate her and if she has any questions to please give the office a call. Pt stated thank you with no further questions.

## 2018-06-26 LAB — GC/CHLAMYDIA PROBE AMP (~~LOC~~) NOT AT ARMC
Chlamydia: NEGATIVE
Neisseria Gonorrhea: NEGATIVE

## 2018-07-03 ENCOUNTER — Telehealth: Payer: Self-pay | Admitting: Family Medicine

## 2018-07-03 ENCOUNTER — Telehealth: Payer: Self-pay | Admitting: *Deleted

## 2018-07-03 ENCOUNTER — Encounter: Payer: Managed Care, Other (non HMO) | Admitting: Obstetrics and Gynecology

## 2018-07-03 NOTE — Telephone Encounter (Signed)
Pt left message stating she has questions about her cerclage.

## 2018-07-03 NOTE — Telephone Encounter (Signed)
The patient called in stating she would like her cerclage removed today or tomorrow as she is experiencing a lot of contractions and she is ready for the baby to come. Informed of the upcoming appointment. Placing on the waitlist for cancellations to get the patient in sooner.

## 2018-07-04 NOTE — Telephone Encounter (Signed)
Called pt and pt informed me that she wanted to have a sooner appt than 07/10/18 to get cerclage removed because she was having a lot of pressure for the past week.  I explained to the pt that the appt she has scheduled is the earliest.  Pt stated understanding with no further questions.

## 2018-07-07 ENCOUNTER — Telehealth: Payer: Self-pay | Admitting: Obstetrics & Gynecology

## 2018-07-07 NOTE — Telephone Encounter (Signed)
Called the patient to inform of face to face visit and new location. The patient verbalized understanding.

## 2018-07-10 ENCOUNTER — Other Ambulatory Visit: Payer: Self-pay

## 2018-07-10 ENCOUNTER — Ambulatory Visit (INDEPENDENT_AMBULATORY_CARE_PROVIDER_SITE_OTHER): Payer: Managed Care, Other (non HMO) | Admitting: Family Medicine

## 2018-07-10 ENCOUNTER — Encounter: Payer: Managed Care, Other (non HMO) | Admitting: Obstetrics and Gynecology

## 2018-07-10 VITALS — BP 133/75 | HR 97 | Wt 290.7 lb

## 2018-07-10 DIAGNOSIS — O99213 Obesity complicating pregnancy, third trimester: Secondary | ICD-10-CM

## 2018-07-10 DIAGNOSIS — Z3A37 37 weeks gestation of pregnancy: Secondary | ICD-10-CM

## 2018-07-10 DIAGNOSIS — O0993 Supervision of high risk pregnancy, unspecified, third trimester: Secondary | ICD-10-CM

## 2018-07-10 DIAGNOSIS — O0990 Supervision of high risk pregnancy, unspecified, unspecified trimester: Secondary | ICD-10-CM

## 2018-07-10 DIAGNOSIS — O3430 Maternal care for cervical incompetence, unspecified trimester: Secondary | ICD-10-CM

## 2018-07-10 DIAGNOSIS — Z8619 Personal history of other infectious and parasitic diseases: Secondary | ICD-10-CM

## 2018-07-10 LAB — OB RESULTS CONSOLE GBS: GBS: NEGATIVE

## 2018-07-10 NOTE — Progress Notes (Addendum)
   PRENATAL VISIT NOTE  Subjective:  Annette Greene is a 28 y.o. G2P0101 at [redacted]w[redacted]d being seen today for ongoing prenatal care.  She is currently monitored for the following issues for this high-risk pregnancy and has Incompetent cervix in pregnancy, antepartum; Premature delivery; Obesity, morbid ([place]); History of herpes simplex type 2 infection; Supervision of high risk pregnancy, antepartum; Abdominal pain affecting pregnancy; Human papilloma virus infection; Irregular periods; and Cyst of ovary on their problem list.  Patient reports no complaints.  Contractions: Irregular. Vag. Bleeding: None.  Movement: Present. Denies leaking of fluid.   The following portions of the patient's history were reviewed and updated as appropriate: allergies, current medications, past family history, past medical history, past social history, past surgical history and problem list.   Objective:   Vitals:   07/10/18 1341  BP: 133/75  Pulse: 97  Weight: 290 lb 11.2 oz (131.9 kg)    Fetal Status: Fetal Heart Rate (bpm): 160   Movement: Present  Presentation: Vertex  General:  Alert, oriented and cooperative. Patient is in no acute distress.  Skin: Skin is warm and dry. No rash noted.   Cardiovascular: Normal heart rate noted  Respiratory: Normal respiratory effort, no problems with respiration noted  Abdomen: Soft, gravid, appropriate for gestational age.  Pain/Pressure: Present     Pelvic: Cervical exam deferred Dilation: 1 Effacement (%): 30 Station: -3  Extremities: Normal range of motion.     Mental Status: Normal mood and affect. Normal behavior. Normal judgment and thought content.   Assessment and Plan:  Pregnancy: G2P0101 at [redacted]w[redacted]d 1. Supervision of high risk pregnancy, antepartum FHT and FH normal - Culture, beta strep (group b only)  2. History of herpes simplex type 2 infection On valtrex. No lesions today.  3. Incompetent cervix in pregnancy, antepartum Cerclage removed  4. Obesity,  morbid ([place])   Term labor symptoms and general obstetric precautions including but not limited to vaginal bleeding, contractions, leaking of fluid and fetal movement were reviewed in detail with the patient. Please refer to After Visit Summary for other counseling recommendations.   Return in about 2 weeks (around 07/24/2018) for HR OB f/u, virtual.  No future appointments.  Truett Mainland, DO

## 2018-07-11 ENCOUNTER — Telehealth: Payer: Self-pay | Admitting: Family Medicine

## 2018-07-11 NOTE — Telephone Encounter (Signed)
Returned pt's call regarding her baby's heartrate and her desire for an in person appointment.  Explained the limitations d/t Covid-19 and that these limits on visits were out of our control and we were following the new guidelines.  Pt expressed frustration and requested to speak with someone else regarding this.

## 2018-07-12 ENCOUNTER — Encounter (HOSPITAL_COMMUNITY): Payer: Self-pay

## 2018-07-12 ENCOUNTER — Other Ambulatory Visit: Payer: Self-pay

## 2018-07-12 ENCOUNTER — Inpatient Hospital Stay (HOSPITAL_COMMUNITY)
Admission: AD | Admit: 2018-07-12 | Discharge: 2018-07-12 | Disposition: A | Discharge status: Home / Self Care | Payer: Managed Care, Other (non HMO) | Attending: Obstetrics and Gynecology | Admitting: Obstetrics and Gynecology

## 2018-07-12 DIAGNOSIS — Z87891 Personal history of nicotine dependence: Secondary | ICD-10-CM | POA: Insufficient documentation

## 2018-07-12 DIAGNOSIS — R109 Unspecified abdominal pain: Secondary | ICD-10-CM | POA: Diagnosis not present

## 2018-07-12 DIAGNOSIS — R0989 Other specified symptoms and signs involving the circulatory and respiratory systems: Secondary | ICD-10-CM | POA: Diagnosis not present

## 2018-07-12 DIAGNOSIS — O26893 Other specified pregnancy related conditions, third trimester: Secondary | ICD-10-CM | POA: Insufficient documentation

## 2018-07-12 DIAGNOSIS — O471 False labor at or after 37 completed weeks of gestation: Secondary | ICD-10-CM | POA: Insufficient documentation

## 2018-07-12 DIAGNOSIS — O163 Unspecified maternal hypertension, third trimester: Secondary | ICD-10-CM | POA: Diagnosis not present

## 2018-07-12 DIAGNOSIS — Z3A37 37 weeks gestation of pregnancy: Secondary | ICD-10-CM | POA: Diagnosis not present

## 2018-07-12 DIAGNOSIS — O368130 Decreased fetal movements, third trimester, not applicable or unspecified: Secondary | ICD-10-CM

## 2018-07-12 LAB — CBC
HCT: 31.2 % — ABNORMAL LOW (ref 36.0–46.0)
Hemoglobin: 10.1 g/dL — ABNORMAL LOW (ref 12.0–15.0)
MCH: 25.9 pg — ABNORMAL LOW (ref 26.0–34.0)
MCHC: 32.4 g/dL (ref 30.0–36.0)
MCV: 80 fL (ref 80.0–100.0)
Platelets: 272 10*3/uL (ref 150–400)
RBC: 3.9 MIL/uL (ref 3.87–5.11)
RDW: 15.5 % (ref 11.5–15.5)
WBC: 8.5 10*3/uL (ref 4.0–10.5)
nRBC: 0 % (ref 0.0–0.2)

## 2018-07-12 LAB — PROTEIN / CREATININE RATIO, URINE
Creatinine, Urine: 286.54 mg/dL
Protein Creatinine Ratio: 0.1 mg/mg{Cre} (ref 0.00–0.15)
Total Protein, Urine: 30 mg/dL

## 2018-07-12 LAB — COMPREHENSIVE METABOLIC PANEL
ALT: 18 U/L (ref 0–44)
AST: 24 U/L (ref 15–41)
Albumin: 2.6 g/dL — ABNORMAL LOW (ref 3.5–5.0)
Alkaline Phosphatase: 77 U/L (ref 38–126)
Anion gap: 10 (ref 5–15)
BUN: 6 mg/dL (ref 6–20)
CO2: 21 mmol/L — ABNORMAL LOW (ref 22–32)
Calcium: 8.8 mg/dL — ABNORMAL LOW (ref 8.9–10.3)
Chloride: 104 mmol/L (ref 98–111)
Creatinine, Ser: 0.75 mg/dL (ref 0.44–1.00)
GFR calc Af Amer: >60 mL/min (ref >60)
GFR calc non Af Amer: >60 mL/min (ref >60)
Glucose, Bld: 95 mg/dL (ref 70–99)
Potassium: 3.6 mmol/L (ref 3.5–5.1)
Sodium: 135 mmol/L (ref 135–145)
Total Bilirubin: 0.3 mg/dL (ref 0.3–1.2)
Total Protein: 6.2 g/dL — ABNORMAL LOW (ref 6.5–8.1)

## 2018-07-12 NOTE — Telephone Encounter (Signed)
Late entry 07/11/18 Spoke with pt in regards to her concern of her baby's FHR at her visit and she wants to come in to hear her baby's HR weekly.  I explained to the pt that due to guidelines set by the CDC and WHO for her and baby safety it is recommended that she stays home.  I also explained to the pt that it is normal that her baby's FHR is variable and that is okay.  Pt stated that she understands that the FHR can change however the nurse who obtained my FHR took it several times and had me turn on my side so it scared her.  I explained to the pt that she can come in on Friday for FHR however after that we can not recommend her to come to office weekly for FHR for the safety of her and baby.  Pt stated understanding with on further questions.

## 2018-07-12 NOTE — MAU Note (Addendum)
CTX since 0000 but they have spaced out in the past few hours.  Hasn't felt baby move since 1300.  No VB/LOF.  Had her cerclage removed on Monday.  Was 1 cm then.  States she had elevated Bps in [place] on Sunday and in the office Monday it was elevated as well.  States she has had a HA since Friday-didn't try taking anything for it.  Had 1 episode of seeing spots yesterday when she was walking. No epigastric pain.

## 2018-07-12 NOTE — MAU Provider Note (Signed)
Chief Complaint:  Decreased Fetal Movement and Abdominal Pain   First Provider Initiated Contact with Patient 07/12/18 2037      HPI: Annette Greene is a 28 y.o. G2P0101 at 69w3dwho presents to maternity admissions reporting decreased fetal movement today.  Has had elevated BP over the weekend. Had a headache before but now it is a "1". She denies LOF, vaginal bleeding, vaginal itching/burning, urinary symptoms, h/a, dizziness, n/v, diarrhea, constipation or fever/chills.  She denies visual changes or RUQ abdominal pain.  RN Note: CTX since 0000 but they have spaced out in the past few hours.  Hasn't felt baby move since 1300.  No VB/LOF.  Had her cerclage removed on Monday.  Was 1 cm then.  States she had elevated Bps in [employer] on Sunday and in the office Monday it was elevated as well.  States she has had a HA since Friday-didn't try taking anything for it.  Had 1 episode of seeing spots yesterday when she was walking. No epigastric pain.  Past Medical History: Past Medical History:  Diagnosis Date  . Herpes   . Hx of chlamydia infection   . Infection    UTI  . Preterm labor     Past obstetric history: OB History  Gravida Para Term Preterm AB Living  2 1 0 1   1  SAB TAB Ectopic Multiple Live Births          1    # Outcome Date GA Lbr Len/2nd Weight Sex Delivery Anes PTL Lv  2 Current           1 Preterm 2018 [redacted]w[redacted]d   M Vag-Spont   LIV    Past Surgical History: Past Surgical History:  Procedure Laterality Date  . CERVICAL CERCLAGE N/A 01/25/2018   Procedure: CERCLAGE CERVICAL;  Surgeon: Everett Graff, MD;  Location: [facility];  Service: Gynecology;  Laterality: N/A;  . TONSILLECTOMY AND ADENOIDECTOMY    . WISDOM TOOTH EXTRACTION     all 4 teeth    Family History: Family History  Problem Relation Age of Onset  . Healthy Mother   . Hypertension Mother   . Healthy Father   . Healthy Sister   . Healthy Maternal Grandmother   . Hypertension Maternal  Grandmother   . Healthy Paternal Grandmother   . Healthy Paternal Grandfather   . Healthy Sister   . Drug abuse Maternal Aunt   . Hypertension Maternal Aunt     Social History: Social History   Tobacco Use  . Smoking status: Former Smoker    Packs/day: 0.50    Types: Cigarettes    Last attempt to quit: 06/24/2016    Years since quitting: 2.0  . Smokeless tobacco: Never Used  . Tobacco comment: stopped since pregnant  Substance Use Topics  . Alcohol use: Not Currently    Comment: stopped with pregnancy  . Drug use: Not Currently    Allergies: No Known Allergies  Meds:  Medications Prior to Admission  Medication Sig Dispense Refill Last Dose  . acetaminophen (TYLENOL) 500 MG tablet Take 500 mg by mouth every 6 (six) hours as needed for headache.   Not Taking  . MAKENA 275 MG/1.1ML SOAJ Inject 275 mg into the muscle once a week.   Not Taking  . ondansetron (ZOFRAN ODT) 4 MG disintegrating tablet Take 1 tablet (4 mg total) by mouth every 8 (eight) hours as needed for nausea or vomiting. (Patient not taking: Reported on 07/10/2018) 20 tablet 0 Not  Taking  . Prenatal Vit-Fe Fumarate-FA (MULTIVITAMIN-PRENATAL) 27-0.8 MG TABS tablet Take 1 tablet by mouth daily.   Taking  . valACYclovir (VALTREX) 500 MG tablet Take 1 tablet (500 mg total) by mouth 2 (two) times daily. (Patient not taking: Reported on 06/14/2018) 30 tablet 6 Not Taking    I have reviewed patient's Past Medical Hx, Surgical Hx, Family Hx, Social Hx, medications and allergies.   ROS:  Review of Systems  Constitutional: Negative for chills and fever.  Eyes: Negative for visual disturbance.  Respiratory: Negative for shortness of breath.   Cardiovascular: Negative for leg swelling.  Gastrointestinal: Negative for abdominal pain, constipation, diarrhea, nausea and vomiting.  Genitourinary: Negative for vaginal bleeding.  Musculoskeletal: Negative for back pain.  Neurological: Negative for weakness.   Other systems  negative  Physical Exam   Patient Vitals for the past 24 hrs:  BP Temp Pulse Resp SpO2 Height Weight  07/12/18 2026 - 98.7 F (37.1 C) - - - - -  07/12/18 2022 (!) 141/70 - 96 19 100 % 5\' 7"  (1.702 m) 133.5 kg   Vitals:   07/12/18 2129 07/12/18 2140 07/12/18 2151 07/12/18 2227  BP: 120/69 117/65 121/67 121/71  Pulse: 98 92 86 97  Resp:    16  Temp:    98.9 F (37.2 C)  TempSrc:    Oral  SpO2:    100%  Weight:      Height:        Constitutional: Well-developed, well-nourished female in no acute distress.  Cardiovascular: normal rate and rhythm Respiratory: normal effort, clear to auscultation bilaterally GI: Abd soft, non-tender, gravid appropriate for gestational age.   No rebound or guarding. MS: Extremities nontender, no edema, normal ROM Neurologic: Alert and oriented x 4.   DTRs 2+, no clonus GU: Neg CVAT.  PELVIC EXAM: deferred  FHT:  Baseline 140 , moderate variability, accelerations present, no decelerations Contractions: Irregular     Labs: Results for orders placed or performed during the hospital encounter of 07/12/18 (from the past 24 hour(s))  Protein / creatinine ratio, urine     Status: None   Collection Time: 07/12/18  8:37 PM  Result Value Ref Range   Creatinine, Urine 286.54 mg/dL   Total Protein, Urine 30 mg/dL   Protein Creatinine Ratio 0.10 0.00 - 0.15 mg/mg[Cre]  CBC     Status: Abnormal   Collection Time: 07/12/18  8:48 PM  Result Value Ref Range   WBC 8.5 4.0 - 10.5 K/uL   RBC 3.90 3.87 - 5.11 MIL/uL   Hemoglobin 10.1 (L) 12.0 - 15.0 g/dL   HCT 31.2 (L) 36.0 - 46.0 %   MCV 80.0 80.0 - 100.0 fL   MCH 25.9 (L) 26.0 - 34.0 pg   MCHC 32.4 30.0 - 36.0 g/dL   RDW 15.5 11.5 - 15.5 %   Platelets 272 150 - 400 K/uL   nRBC 0.0 0.0 - 0.2 %  Comprehensive metabolic panel     Status: Abnormal   Collection Time: 07/12/18  8:48 PM  Result Value Ref Range   Sodium 135 135 - 145 mmol/L   Potassium 3.6 3.5 - 5.1 mmol/L   Chloride 104 98 - 111 mmol/L    CO2 21 (L) 22 - 32 mmol/L   Glucose, Bld 95 70 - 99 mg/dL   BUN 6 6 - 20 mg/dL   Creatinine, Ser 0.75 0.44 - 1.00 mg/dL   Calcium 8.8 (L) 8.9 - 10.3 mg/dL   Total Protein 6.2 (  L) 6.5 - 8.1 g/dL   Albumin 2.6 (L) 3.5 - 5.0 g/dL   AST 24 15 - 41 U/L   ALT 18 0 - 44 U/L   Alkaline Phosphatase 77 38 - 126 U/L   Total Bilirubin 0.3 0.3 - 1.2 mg/dL   GFR calc non Af Amer >60 >60 mL/min   GFR calc Af Amer >60 >60 mL/min   Anion gap 10 5 - 15    O/Positive/-- (10/25 0000)  Imaging:    MAU Course/MDM: I have ordered labs and reviewed results. Preeclampsia labs normal  NST reviewed and is reactive Blood pressure was elevated to systolic 563 once.  No other elevations noted throughout MAU stay  Treatments in MAU included EFM, preeclampsia evaluation.    Assessment: Single intrauterine pregnancy at [redacted]w[redacted]d Decreased fetal movement Reactive nonstress test Uterine contractions, not in labor Labile hypertension Normal preeclampsia labs  Plan: Discharge home Labor precautions and fetal kick counts Preeclampsia precautions  Follow up in Office for prenatal visits and recheck  Encouraged to return here or to other Urgent Care/ED if she develops worsening of symptoms, increase in pain, fever, or other concerning symptoms.   Pt stable at time of discharge.  Hansel Feinstein CNM, MSN Certified Nurse-Midwife 07/12/2018 8:37 PM

## 2018-07-12 NOTE — Discharge Instructions (Signed)
Third Trimester of Pregnancy The third trimester is from week 28 through week 40 (months 7 through 9). The third trimester is a time when the unborn baby (fetus) is growing rapidly. At the end of the ninth month, the fetus is about 20 inches in length and weighs 6-10 pounds. Body changes during your third trimester Your body will continue to go through many changes during pregnancy. The changes vary from woman to woman. During the third trimester:  Your weight will continue to increase. You can expect to gain 25-35 pounds (11-16 kg) by the end of the pregnancy.  You may begin to get stretch marks on your hips, abdomen, and breasts.  You may urinate more often because the fetus is moving lower into your pelvis and pressing on your bladder.  You may develop or continue to have heartburn. This is caused by increased hormones that slow down muscles in the digestive tract.  You may develop or continue to have constipation because increased hormones slow digestion and cause the muscles that push waste through your intestines to relax.  You may develop hemorrhoids. These are swollen veins (varicose veins) in the rectum that can itch or be painful.  You may develop swollen, bulging veins (varicose veins) in your legs.  You may have increased body aches in the pelvis, back, or thighs. This is due to weight gain and increased hormones that are relaxing your joints.  You may have changes in your hair. These can include thickening of your hair, rapid growth, and changes in texture. Some women also have hair loss during or after pregnancy, or hair that feels dry or thin. Your hair will most likely return to normal after your baby is born.  Your breasts will continue to grow and they will continue to become tender. A yellow fluid (colostrum) may leak from your breasts. This is the first milk you are producing for your baby.  Your belly button may stick out.  You may notice more swelling in your hands,  face, or ankles.  You may have increased tingling or numbness in your hands, arms, and legs. The skin on your belly may also feel numb.  You may feel short of breath because of your expanding uterus.  You may have more problems sleeping. This can be caused by the size of your belly, increased need to urinate, and an increase in your body's metabolism.  You may notice the fetus "dropping," or moving lower in your abdomen (lightening).  You may have increased vaginal discharge.  You may notice your joints feel loose and you may have pain around your pelvic bone. What to expect at prenatal visits You will have prenatal exams every 2 weeks until week 36. Then you will have weekly prenatal exams. During a routine prenatal visit:  You will be weighed to make sure you and the baby are growing normally.  Your blood pressure will be taken.  Your abdomen will be measured to track your baby's growth.  The fetal heartbeat will be listened to.  Any test results from the previous visit will be discussed.  You may have a cervical check near your due date to see if your cervix has softened or thinned (effaced).  You will be tested for Group B streptococcus. This happens between 35 and 37 weeks. Your health care provider may ask you:  What your birth plan is.  How you are feeling.  If you are feeling the baby move.  If you have had any abnormal   symptoms, such as leaking fluid, bleeding, severe headaches, or abdominal cramping.  If you are using any tobacco products, including cigarettes, chewing tobacco, and electronic cigarettes.  If you have any questions. Other tests or screenings that may be performed during your third trimester include:  Blood tests that check for low iron levels (anemia).  Fetal testing to check the health, activity level, and growth of the fetus. Testing is done if you have certain medical conditions or if there are problems during the pregnancy.  Nonstress test  (NST). This test checks the health of your baby to make sure there are no signs of problems, such as the baby not getting enough oxygen. During this test, a belt is placed around your belly. The baby is made to move, and its heart rate is monitored during movement. What is false labor? False labor is a condition in which you feel small, irregular tightenings of the muscles in the womb (contractions) that usually go away with rest, changing position, or drinking water. These are called Braxton Hicks contractions. Contractions may last for hours, days, or even weeks before true labor sets in. If contractions come at regular intervals, become more frequent, increase in intensity, or become painful, you should see your health care provider. What are the signs of labor?  Abdominal cramps.  Regular contractions that start at 10 minutes apart and become stronger and more frequent with time.  Contractions that start on the top of the uterus and spread down to the lower abdomen and back.  Increased pelvic pressure and dull back pain.  A watery or bloody mucus discharge that comes from the vagina.  Leaking of amniotic fluid. This is also known as your "water breaking." It could be a slow trickle or a gush. Let your health care provider know if it has a color or strange odor. If you have any of these signs, call your health care provider right away, even if it is before your due date. Follow these instructions at home: Medicines  Follow your health care provider's instructions regarding medicine use. Specific medicines may be either safe or unsafe to take during pregnancy.  Take a prenatal vitamin that contains at least 600 micrograms (mcg) of folic acid.  If you develop constipation, try taking a stool softener if your health care provider approves. Eating and drinking   Eat a balanced diet that includes fresh fruits and vegetables, whole grains, good sources of protein such as meat, eggs, or tofu,  and low-fat dairy. Your health care provider will help you determine the amount of weight gain that is right for you.  Avoid raw meat and uncooked cheese. These carry germs that can cause birth defects in the baby.  If you have low calcium intake from food, talk to your health care provider about whether you should take a daily calcium supplement.  Eat four or five small meals rather than three large meals a day.  Limit foods that are high in fat and processed sugars, such as fried and sweet foods.  To prevent constipation: ? Drink enough fluid to keep your urine clear or pale yellow. ? Eat foods that are high in fiber, such as fresh fruits and vegetables, whole grains, and beans. Activity  Exercise only as directed by your health care provider. Most women can continue their usual exercise routine during pregnancy. Try to exercise for 30 minutes at least 5 days a week. Stop exercising if you experience uterine contractions.  Avoid heavy lifting.  Do   not exercise in extreme heat or humidity, or at high altitudes.  Wear low-heel, comfortable shoes.  Practice good posture.  You may continue to have sex unless your health care provider tells you otherwise. Relieving pain and discomfort  Take frequent breaks and rest with your legs elevated if you have leg cramps or low back pain.  Take warm sitz baths to soothe any pain or discomfort caused by hemorrhoids. Use hemorrhoid cream if your health care provider approves.  Wear a good support bra to prevent discomfort from breast tenderness.  If you develop varicose veins: ? Wear support pantyhose or compression stockings as told by your healthcare provider. ? Elevate your feet for 15 minutes, 3-4 times a day. Prenatal care  Write down your questions. Take them to your prenatal visits.  Keep all your prenatal visits as told by your health care provider. This is important. Safety  Wear your seat belt at all times when driving.  Make  a list of emergency phone numbers, including numbers for family, friends, the hospital, and police and fire departments. General instructions  Avoid cat litter boxes and soil used by cats. These carry germs that can cause birth defects in the baby. If you have a cat, ask someone to clean the litter box for you.  Do not travel far distances unless it is absolutely necessary and only with the approval of your health care provider.  Do not use hot tubs, steam rooms, or saunas.  Do not drink alcohol.  Do not use any products that contain nicotine or tobacco, such as cigarettes and e-cigarettes. If you need help quitting, ask your health care provider.  Do not use any medicinal herbs or unprescribed drugs. These chemicals affect the formation and growth of the baby.  Do not douche or use tampons or scented sanitary pads.  Do not cross your legs for long periods of time.  To prepare for the arrival of your baby: ? Take prenatal classes to understand, practice, and ask questions about labor and delivery. ? Make a trial run to the hospital. ? Visit the hospital and tour the maternity area. ? Arrange for maternity or paternity leave through employers. ? Arrange for family and friends to take care of pets while you are in the hospital. ? Purchase a rear-facing car seat and make sure you know how to install it in your car. ? Pack your hospital bag. ? Prepare the baby's nursery. Make sure to remove all pillows and stuffed animals from the baby's crib to prevent suffocation.  Visit your dentist if you have not gone during your pregnancy. Use a soft toothbrush to brush your teeth and be gentle when you floss. Contact a health care provider if:  You are unsure if you are in labor or if your water has broken.  You become dizzy.  You have mild pelvic cramps, pelvic pressure, or nagging pain in your abdominal area.  You have lower back pain.  You have persistent nausea, vomiting, or  diarrhea.  You have an unusual or bad smelling vaginal discharge.  You have pain when you urinate. Get help right away if:  Your water breaks before 37 weeks.  You have regular contractions less than 5 minutes apart before 37 weeks.  You have a fever.  You are leaking fluid from your vagina.  You have spotting or bleeding from your vagina.  You have severe abdominal pain or cramping.  You have rapid weight loss or weight gain.  You have   shortness of breath with chest pain.  You notice sudden or extreme swelling of your face, hands, ankles, feet, or legs.  Your baby makes fewer than 10 movements in 2 hours.  You have severe headaches that do not go away when you take medicine.  You have vision changes. Summary  The third trimester is from week 28 through week 40, months 7 through 9. The third trimester is a time when the unborn baby (fetus) is growing rapidly.  During the third trimester, your discomfort may increase as you and your baby continue to gain weight. You may have abdominal, leg, and back pain, sleeping problems, and an increased need to urinate.  During the third trimester your breasts will keep growing and they will continue to become tender. A yellow fluid (colostrum) may leak from your breasts. This is the first milk you are producing for your baby.  False labor is a condition in which you feel small, irregular tightenings of the muscles in the womb (contractions) that eventually go away. These are called Braxton Hicks contractions. Contractions may last for hours, days, or even weeks before true labor sets in.  Signs of labor can include: abdominal cramps; regular contractions that start at 10 minutes apart and become stronger and more frequent with time; watery or bloody mucus discharge that comes from the vagina; increased pelvic pressure and dull back pain; and leaking of amniotic fluid. This information is not intended to replace advice given to you by your  health care provider. Make sure you discuss any questions you have with your health care provider. Document Released: 03/02/2001 Document Revised: 04/13/2016 Document Reviewed: 04/13/2016 [employer] Interactive Patient Education  2019 [employer]  

## 2018-07-14 LAB — CULTURE, BETA STREP (GROUP B ONLY): Strep Gp B Culture: NEGATIVE

## 2018-07-19 ENCOUNTER — Ambulatory Visit (INDEPENDENT_AMBULATORY_CARE_PROVIDER_SITE_OTHER): Payer: Managed Care, Other (non HMO)

## 2018-07-19 ENCOUNTER — Telehealth: Payer: Self-pay | Admitting: Obstetrics and Gynecology

## 2018-07-19 ENCOUNTER — Other Ambulatory Visit: Payer: Self-pay

## 2018-07-19 DIAGNOSIS — Z0130 Encounter for examination of blood pressure without abnormal findings: Secondary | ICD-10-CM

## 2018-07-19 MED ORDER — CYCLOBENZAPRINE HCL 5 MG PO TABS: 5.0000 mg | ORAL_TABLET | Freq: Three times a day (TID) | ORAL | 0 refills | Status: DC | PRN

## 2018-07-19 NOTE — Telephone Encounter (Addendum)
Called pt and pt informed me that she has been having headaches since 17th of and on and the medication prescribed is not working.  I asked pt if she had a BP cuff at home to check her BP she stated "no, they never sent me one and I was tired of dealing with it."  I asked pt if she would be able to come in for a BP check.  Pt stated that she would be able to come today at 1330.  Pt also requested to a different medication for headaches.  Per Dr. Rosana Hoes pt can have Flexeril for management of headaches.  [place] office notified to place pt on schedule for today.

## 2018-07-19 NOTE — Progress Notes (Signed)
Pt here today for BP check due to c/o headaches.  BP 123/88.  Pt reports currently having a headache.  I explained to the pt to continue to monitor for HTN sx's.  I also informed pt that the provider has prescribed Flexeril 5 mg for headaches and that is has been sent to her [facility] on [address].  Pt verbalized understanding.

## 2018-07-19 NOTE — Telephone Encounter (Signed)
Opened in error

## 2018-07-20 ENCOUNTER — Encounter (HOSPITAL_COMMUNITY): Payer: Self-pay | Admitting: *Deleted

## 2018-07-20 ENCOUNTER — Inpatient Hospital Stay (HOSPITAL_COMMUNITY): Payer: Managed Care, Other (non HMO) | Admitting: Anesthesiology

## 2018-07-20 ENCOUNTER — Inpatient Hospital Stay (EMERGENCY_DEPARTMENT_HOSPITAL)
Admission: AD | Admit: 2018-07-20 | Discharge: 2018-07-20 | Disposition: A | Discharge status: Home / Self Care | Payer: Managed Care, Other (non HMO) | Source: Home / Self Care | Attending: Family Medicine | Admitting: Family Medicine

## 2018-07-20 ENCOUNTER — Inpatient Hospital Stay (HOSPITAL_COMMUNITY)
Admission: AD | Admit: 2018-07-20 | Discharge: 2018-07-23 | DRG: 807 | Disposition: A | Discharge status: Home / Self Care | Payer: Managed Care, Other (non HMO) | Attending: Obstetrics and Gynecology | Admitting: Obstetrics and Gynecology

## 2018-07-20 ENCOUNTER — Other Ambulatory Visit: Payer: Self-pay

## 2018-07-20 DIAGNOSIS — A6000 Herpesviral infection of urogenital system, unspecified: Secondary | ICD-10-CM | POA: Diagnosis present

## 2018-07-20 DIAGNOSIS — Z3A38 38 weeks gestation of pregnancy: Secondary | ICD-10-CM

## 2018-07-20 DIAGNOSIS — O9902 Anemia complicating childbirth: Secondary | ICD-10-CM | POA: Diagnosis not present

## 2018-07-20 DIAGNOSIS — O471 False labor at or after 37 completed weeks of gestation: Secondary | ICD-10-CM | POA: Insufficient documentation

## 2018-07-20 DIAGNOSIS — Z87891 Personal history of nicotine dependence: Secondary | ICD-10-CM

## 2018-07-20 DIAGNOSIS — O479 False labor, unspecified: Secondary | ICD-10-CM | POA: Diagnosis not present

## 2018-07-20 DIAGNOSIS — O9832 Other infections with a predominantly sexual mode of transmission complicating childbirth: Secondary | ICD-10-CM | POA: Diagnosis present

## 2018-07-20 DIAGNOSIS — O99214 Obesity complicating childbirth: Secondary | ICD-10-CM | POA: Diagnosis present

## 2018-07-20 DIAGNOSIS — O26893 Other specified pregnancy related conditions, third trimester: Secondary | ICD-10-CM | POA: Diagnosis present

## 2018-07-20 DIAGNOSIS — Z8619 Personal history of other infectious and parasitic diseases: Secondary | ICD-10-CM | POA: Diagnosis present

## 2018-07-20 DIAGNOSIS — D649 Anemia, unspecified: Secondary | ICD-10-CM | POA: Diagnosis not present

## 2018-07-20 DIAGNOSIS — O3430 Maternal care for cervical incompetence, unspecified trimester: Secondary | ICD-10-CM | POA: Diagnosis present

## 2018-07-20 LAB — CBC
HCT: 31.9 % — ABNORMAL LOW (ref 36.0–46.0)
Hemoglobin: 10 g/dL — ABNORMAL LOW (ref 12.0–15.0)
MCH: 25.3 pg — ABNORMAL LOW (ref 26.0–34.0)
MCHC: 31.3 g/dL (ref 30.0–36.0)
MCV: 80.6 fL (ref 80.0–100.0)
Platelets: 282 10*3/uL (ref 150–400)
RBC: 3.96 MIL/uL (ref 3.87–5.11)
RDW: 15.9 % — ABNORMAL HIGH (ref 11.5–15.5)
WBC: 9 10*3/uL (ref 4.0–10.5)
nRBC: 0 % (ref 0.0–0.2)

## 2018-07-20 LAB — TYPE AND SCREEN
ABO/RH(D): O POS
Antibody Screen: NEGATIVE

## 2018-07-20 MED ORDER — EPHEDRINE 5 MG/ML INJ: 10.0000 mg | INTRAVENOUS | Status: DC | PRN

## 2018-07-20 MED ORDER — OXYTOCIN 40 UNITS IN NORMAL SALINE INFUSION - SIMPLE MED
2.5000 [IU]/h | INTRAVENOUS | Status: DC
  Administered 2018-07-21: 2.5 [IU]/h via INTRAVENOUS
  Filled 2018-07-20: qty 1000

## 2018-07-20 MED ORDER — FENTANYL-BUPIVACAINE-NACL 0.5-0.125-0.9 MG/250ML-% EP SOLN
12.0000 mL/h | EPIDURAL | Status: DC | PRN
  Filled 2018-07-20: qty 250

## 2018-07-20 MED ORDER — OXYCODONE-ACETAMINOPHEN 5-325 MG PO TABS: 1.0000 | ORAL_TABLET | ORAL | Status: DC | PRN

## 2018-07-20 MED ORDER — SOD CITRATE-CITRIC ACID 500-334 MG/5ML PO SOLN: 30.0000 mL | ORAL | Status: DC | PRN

## 2018-07-20 MED ORDER — LACTATED RINGERS IV SOLN
500.0000 mL | Freq: Once | INTRAVENOUS | Status: AC
  Administered 2018-07-20: 500 mL via INTRAVENOUS

## 2018-07-20 MED ORDER — OXYTOCIN BOLUS FROM INFUSION
500.0000 mL | Freq: Once | INTRAVENOUS | Status: AC
  Administered 2018-07-21: 500 mL via INTRAVENOUS

## 2018-07-20 MED ORDER — LACTATED RINGERS IV SOLN: 500.0000 mL | INTRAVENOUS | Status: DC | PRN

## 2018-07-20 MED ORDER — SODIUM CHLORIDE (PF) 0.9 % IJ SOLN
INTRAMUSCULAR | Status: DC | PRN
  Administered 2018-07-20: 14 mL/h via EPIDURAL

## 2018-07-20 MED ORDER — FENTANYL CITRATE (PF) 100 MCG/2ML IJ SOLN: 100.0000 ug | INTRAMUSCULAR | Status: DC | PRN

## 2018-07-20 MED ORDER — DIPHENHYDRAMINE HCL 50 MG/ML IJ SOLN: 12.5000 mg | INTRAMUSCULAR | Status: DC | PRN

## 2018-07-20 MED ORDER — OXYCODONE-ACETAMINOPHEN 5-325 MG PO TABS: 2.0000 | ORAL_TABLET | ORAL | Status: DC | PRN

## 2018-07-20 MED ORDER — ONDANSETRON HCL 4 MG/2ML IJ SOLN
4.0000 mg | Freq: Four times a day (QID) | INTRAMUSCULAR | Status: DC | PRN
  Administered 2018-07-21: 4 mg via INTRAVENOUS
  Filled 2018-07-20: qty 2

## 2018-07-20 MED ORDER — LIDOCAINE HCL (PF) 1 % IJ SOLN
INTRAMUSCULAR | Status: DC | PRN
  Administered 2018-07-20: 4 mL via EPIDURAL
  Administered 2018-07-20: 5 mL via EPIDURAL

## 2018-07-20 MED ORDER — LIDOCAINE HCL (PF) 1 % IJ SOLN: 30.0000 mL | INTRAMUSCULAR | Status: DC | PRN

## 2018-07-20 MED ORDER — PHENYLEPHRINE 40 MCG/ML (10ML) SYRINGE FOR IV PUSH (FOR BLOOD PRESSURE SUPPORT): 80.0000 ug | PREFILLED_SYRINGE | INTRAVENOUS | Status: DC | PRN

## 2018-07-20 MED ORDER — ACETAMINOPHEN 325 MG PO TABS: 650.0000 mg | ORAL_TABLET | ORAL | Status: DC | PRN

## 2018-07-20 MED ORDER — FENTANYL-BUPIVACAINE-NACL 0.5-0.125-0.9 MG/250ML-% EP SOLN: 12.0000 mL/h | EPIDURAL | Status: DC | PRN

## 2018-07-20 MED ORDER — LACTATED RINGERS IV SOLN
INTRAVENOUS | Status: DC
  Administered 2018-07-20: 23:00:00 via INTRAVENOUS

## 2018-07-20 NOTE — MAU Note (Signed)
Pt C/O intense uc's since 0515, denies bleeding or LOF.  Reports good fetal movement.

## 2018-07-20 NOTE — Anesthesia Preprocedure Evaluation (Signed)
Anesthesia Evaluation  Patient identified by MRN, date of birth, ID band Patient awake    Reviewed: Allergy & Precautions, NPO status , Patient's Chart, lab work & pertinent test results  History of Anesthesia Complications Negative for: history of anesthetic complications  Airway Mallampati: II  TM Distance: >3 FB Neck ROM: Full    Dental   Pulmonary former smoker,    breath sounds clear to auscultation       Cardiovascular negative cardio ROS   Rhythm:Regular Rate:Normal     Neuro/Psych negative neurological ROS  negative psych ROS   GI/Hepatic negative GI ROS, Neg liver ROS,   Endo/Other  Morbid obesity  Renal/GU negative Renal ROS     Musculoskeletal negative musculoskeletal ROS (+)   Abdominal (+) + obese,   Peds  Hematology  (+) anemia ,   Anesthesia Other Findings HSV  Reproductive/Obstetrics (+) Pregnancy                             Anesthesia Physical Anesthesia Plan  ASA: III  Anesthesia Plan: Epidural   Post-op Pain Management:    Induction:   PONV Risk Score and Plan: 2 and Treatment may vary due to age or medical condition  Airway Management Planned: Natural Airway  Additional Equipment: None  Intra-op Plan:   Post-operative Plan:   Informed Consent: I have reviewed the patients History and Physical, chart, labs and discussed the procedure including the risks, benefits and alternatives for the proposed anesthesia with the patient or authorized representative who has indicated his/her understanding and acceptance.       Plan Discussed with: Anesthesiologist  Anesthesia Plan Comments: (Labs reviewed. Platelets acceptable, patient not taking any blood thinning medications. Per RN, FHR tracing reported to be stable enough for sitting procedure. Risks and benefits discussed with patient, including PDPH, backache, epidural hematoma, failed epidural, allergic  reaction, and nerve injury. Patient expressed understanding and wished to proceed.)        Anesthesia Quick Evaluation

## 2018-07-20 NOTE — MAU Provider Note (Signed)
S: Ms. Annette Greene is a 28 y.o. G2P0101 at [redacted]w[redacted]d  who presents to MAU today for labor evaluation.     Cervical exam by RN: no cervical change over 2 hours  Dilation: 3.5 Effacement (%): 60 Cervical Position: Posterior Station: -3 Presentation: Vertex Exam by:: Velna Ochs RN  Fetal Monitoring: Baseline: 150 Variability: moderate Accelerations: present Decelerations: one variable deceleration Contractions: 3-4  MDM Discussed patient with RN. NST reviewed.   A: SIUP at [redacted]w[redacted]d  False labor  P: Discharge home Labor precautions and kick counts included in AVS Patient to follow-up with [facility] as scheduled  Patient may return to MAU as needed or when in labor   Lajean Manes, CNM 07/20/2018 10:13 AM

## 2018-07-20 NOTE — H&P (Signed)
LABOR AND DELIVERY ADMISSION HISTORY AND PHYSICAL NOTE  RUTHY FORRY is a 28 y.o. female G97P0101 with IUP at [redacted]w[redacted]d by first trimester sono presenting for SOL. Patient was 3 cm on prior labor check this AM in MAU.  She reports positive fetal movement. She denies leakage of fluid or vaginal bleeding.  Prenatal History/Complications: PNC at [facility], transferred from [place]  Pregnancy complications:  - h/o previous preterm delivery at 26 weeks due to incompetent cervix, cerclage in place this pregnancy and was on 17-P injections  -h/o HSV on Valtrex  -h/o HPV   Past Medical History: Past Medical History:  Diagnosis Date  . Herpes   . Hx of chlamydia infection   . Infection    UTI  . Preterm labor     Past Surgical History: Past Surgical History:  Procedure Laterality Date  . CERVICAL CERCLAGE N/A 01/25/2018   Procedure: CERCLAGE CERVICAL;  Surgeon: Everett Graff, MD;  Location: [facility];  Service: Gynecology;  Laterality: N/A;  . TONSILLECTOMY AND ADENOIDECTOMY    . WISDOM TOOTH EXTRACTION     all 4 teeth    Obstetrical History: OB History    Gravida  2   Para  1   Term  0   Preterm  1   AB      Living  1     SAB      TAB      Ectopic      Multiple      Live Births  1           Social History: Social History   Socioeconomic History  . Marital status: Single    Spouse name: Not on file  . Number of children: Not on file  . Years of education: Not on file  . Highest education level: Not on file  Occupational History  . Not on file  Social Needs  . Financial resource strain: Not hard at all  . Food insecurity:    Worry: Never true    Inability: Never true  . Transportation needs:    Medical: No    Non-medical: No  Tobacco Use  . Smoking status: Former Smoker    Packs/day: 0.50    Types: Cigarettes    Last attempt to quit: 06/24/2016    Years since quitting: 2.0  . Smokeless tobacco: Never Used  . Tobacco comment: stopped since  pregnant  Substance and Sexual Activity  . Alcohol use: Not Currently    Comment: stopped with pregnancy  . Drug use: Not Currently  . Sexual activity: Not Currently    Birth control/protection: None  Lifestyle  . Physical activity:    Days per week: Not on file    Minutes per session: Not on file  . Stress: Only a little  Relationships  . Social connections:    Talks on phone: Not on file    Gets together: Not on file    Attends religious service: Not on file    Active member of club or organization: Not on file    Attends meetings of clubs or organizations: Not on file    Relationship status: Not on file  Other Topics Concern  . Not on file  Social History Narrative  . Not on file    Family History: Family History  Problem Relation Age of Onset  . Healthy Mother   . Hypertension Mother   . Healthy Father   . Healthy Sister   . Healthy Maternal  Grandmother   . Hypertension Maternal Grandmother   . Healthy Paternal Grandmother   . Healthy Paternal Grandfather   . Healthy Sister   . Drug abuse Maternal Aunt   . Hypertension Maternal Aunt     Allergies: No Known Allergies  Medications Prior to Admission  Medication Sig Dispense Refill Last Dose  . acetaminophen (TYLENOL) 500 MG tablet Take 500 mg by mouth every 6 (six) hours as needed for headache.   Unknown at Unknown time  . cyclobenzaprine (FLEXERIL) 5 MG tablet Take 1 tablet (5 mg total) by mouth 3 (three) times daily as needed for muscle spasms. 30 tablet 0 Unknown at Unknown time  . ondansetron (ZOFRAN ODT) 4 MG disintegrating tablet Take 1 tablet (4 mg total) by mouth every 8 (eight) hours as needed for nausea or vomiting. (Patient not taking: Reported on 07/10/2018) 20 tablet 0 Not Taking  . Prenatal Vit-Fe Fumarate-FA (MULTIVITAMIN-PRENATAL) 27-0.8 MG TABS tablet Take 1 tablet by mouth daily.   Unknown at Unknown time  . valACYclovir (VALTREX) 500 MG tablet Take 1 tablet (500 mg total) by mouth 2 (two) times  daily. (Patient not taking: Reported on 06/14/2018) 30 tablet 6 Not Taking     Review of Systems  All systems reviewed and negative except as stated in HPI  Physical Exam Blood pressure 133/72, pulse 92, temperature 99.3 F (37.4 C), temperature source Axillary, resp. rate 20, last menstrual period 10/13/2017, SpO2 100 %, unknown if currently breastfeeding. General appearance: alert, oriented, NAD Lungs: normal respiratory effort Heart: regular rate Abdomen: soft, non-tender; gravid, FH appropriate for GA Extremities: No calf swelling or tenderness Presentation: cephalic Fetal monitoring: 150 bpm, moderate variability, +acels, no decels  Uterine activity: q3-4 min  Dilation: 5 Effacement (%): 70 Station: -3 Exam by:: Esau Grew, RN  Prenatal labs: ABO, Rh: O/Positive/-- (10/25 0000) Antibody: Negative (10/25 0000) Rubella: Immune (10/25 0000) RPR: Non Reactive (02/12 0950)  HBsAg: Negative (10/25 0000)  HIV: Non Reactive (02/12 0950)  GC/Chlamydia: Negative  GBS:   Negative  2-hr GTT: Normal  Genetic screening:  Negative  Anatomy US: Normal   Prenatal Transfer Tool  Maternal Diabetes: No Genetic Screening: Normal Maternal Ultrasounds/Referrals: Normal Fetal Ultrasounds or other Referrals:  Referred to Materal Fetal Medicine  Maternal Substance Abuse:  No Significant Maternal Medications:  Meds include: Other: Valtrex  Significant Maternal Lab Results: Lab values include: Group B Strep negative  No results found for this or any previous visit (from the past 24 hour(s)).  Patient Active Problem List   Diagnosis Date Noted  . Human papilloma virus infection 06/02/2018  . Irregular periods 06/02/2018  . Abdominal pain affecting pregnancy 02/06/2018  . Obesity, morbid ([place]) 12/27/2017  . History of herpes simplex type 2 infection 12/27/2017  . Supervision of high risk pregnancy, antepartum 12/27/2017  . Premature delivery 12/10/2016  . Incompetent cervix in  pregnancy, antepartum 11/24/2016  . Cyst of ovary 08/17/2016    Assessment: SHAKOYA GILMORE is a 28 y.o. G2P0101 at [redacted]w[redacted]d here for SOL.   #Labor: Expectant management.  #Pain: Plans for epidural.  #FWB: Cat I   #ID:  GBS neg  #MOF: Breast  #MOC: Nexplanon   #Circ:  Yes, will decide inpatient vs. Outpatient   Melina Schools 07/20/2018, 8:46 PM

## 2018-07-20 NOTE — MAU Note (Signed)
Pt presents to MAU c/o ctx every 3-4 min. No bleeding or LOF. Pt reports loss of mucous plug today. +FM. Pt reports nausea.

## 2018-07-20 NOTE — Discharge Instructions (Signed)
Braxton Hicks Contractions Contractions of the uterus can occur throughout pregnancy, but they are not always a sign that you are in labor. You may have practice contractions called Braxton Hicks contractions. These false labor contractions are sometimes confused with true labor. What are [place] contractions? Braxton Hicks contractions are tightening movements that occur in the muscles of the uterus before labor. Unlike true labor contractions, these contractions do not result in opening (dilation) and thinning of the cervix. Toward the end of pregnancy (32-34 weeks), Braxton Hicks contractions can happen more often and may become stronger. These contractions are sometimes difficult to tell apart from true labor because they can be very uncomfortable. You should not feel embarrassed if you go to the hospital with false labor. Sometimes, the only way to tell if you are in true labor is for your health care provider to look for changes in the cervix. The health care provider will do a physical exam and may monitor your contractions. If you are not in true labor, the exam should show that your cervix is not dilating and your water has not broken. If there are no other health problems associated with your pregnancy, it is completely safe for you to be sent home with false labor. You may continue to have Braxton Hicks contractions until you go into true labor. How to tell the difference between true labor and false labor True labor  Contractions last 30-70 seconds.  Contractions become very regular.  Discomfort is usually felt in the top of the uterus, and it spreads to the lower abdomen and low back.  Contractions do not go away with walking.  Contractions usually become more intense and increase in frequency.  The cervix dilates and gets thinner. False labor  Contractions are usually shorter and not as strong as true labor contractions.  Contractions are usually irregular.  Contractions  are often felt in the front of the lower abdomen and in the groin.  Contractions may go away when you walk around or change positions while lying down.  Contractions get weaker and are shorter-lasting as time goes on.  The cervix usually does not dilate or become thin. Follow these instructions at home:   Take over-the-counter and prescription medicines only as told by your health care provider.  Keep up with your usual exercises and follow other instructions from your health care provider.  Eat and drink lightly if you think you are going into labor.  If Braxton Hicks contractions are making you uncomfortable: ? Change your position from lying down or resting to walking, or change from walking to resting. ? Sit and rest in a tub of warm water. ? Drink enough fluid to keep your urine pale yellow. Dehydration may cause these contractions. ? Do slow and deep breathing several times an hour.  Keep all follow-up prenatal visits as told by your health care provider. This is important. Contact a health care provider if:  You have a fever.  You have continuous pain in your abdomen. Get help right away if:  Your contractions become stronger, more regular, and closer together.  You have fluid leaking or gushing from your vagina.  You have bleeding from your vagina.  You have low back pain that you never had before.  You feel your babys head pushing down and causing pelvic pressure.  Your baby is not moving inside you as much as it used to. Summary  Contractions that occur before labor are called Braxton Hicks contractions, false labor, or  practice contractions.  Braxton Hicks contractions are usually shorter, weaker, farther apart, and less regular than true labor contractions. True labor contractions usually become progressively stronger and regular, and they become more frequent.  Manage discomfort from [facility] contractions by changing position, resting in a warm bath,  drinking plenty of water, or practicing deep breathing. This information is not intended to replace advice given to you by your health care provider. Make sure you discuss any questions you have with your health care provider. Document Released: 07/22/2016 Document Revised: 12/21/2016 Document Reviewed: 07/22/2016 Elsevier Interactive Patient Education  2019 [employer].

## 2018-07-20 NOTE — Anesthesia Procedure Notes (Signed)
Epidural Patient location during procedure: OB Start time: 07/20/2018 9:53 PM End time: 07/20/2018 9:57 PM  Staffing Anesthesiologist: Audry Pili, MD Performed: anesthesiologist   Preanesthetic Checklist Completed: patient identified, pre-op evaluation, timeout performed, IV checked, risks and benefits discussed and monitors and equipment checked  Epidural Patient position: sitting Prep: DuraPrep Patient monitoring: continuous pulse ox and blood pressure Approach: midline Location: L3-L4 Injection technique: LOR saline  Needle:  Needle type: Tuohy  Needle gauge: 17 G Needle length: 9 cm Needle insertion depth: 8 cm Catheter size: 19 Gauge Catheter at skin depth: 13 cm Test dose: negative and Other (1% lidocaine)  Assessment Events: blood not aspirated  Additional Notes Patient identified. Risks including, but not limited to, bleeding, infection, nerve damage, paralysis, inadequate analgesia, blood pressure changes, nausea, vomiting, allergic reaction, postpartum back pain, itching, and headache were discussed. Patient expressed understanding and wished to proceed. Sterile prep and drape, including hand hygiene, mask, and sterile gloves were used. The patient was positioned and the spine was prepped. The skin was anesthetized with lidocaine. No paraesthesia or other complication noted. The patient did not experience any signs of intravascular injection such as tinnitus or metallic taste in mouth, nor signs of intrathecal spread such as rapid motor block. Please see nursing notes for vital signs. The patient tolerated the procedure well.   Renold Don, MDReason for block:procedure for pain

## 2018-07-21 ENCOUNTER — Encounter (HOSPITAL_COMMUNITY): Payer: Self-pay | Admitting: *Deleted

## 2018-07-21 DIAGNOSIS — Z3A38 38 weeks gestation of pregnancy: Secondary | ICD-10-CM

## 2018-07-21 LAB — ABO/RH: ABO/RH(D): O POS

## 2018-07-21 LAB — SYPHILIS: RPR W/REFLEX TO RPR TITER AND TREPONEMAL ANTIBODIES, TRADITIONAL SCREENING AND DIAGNOSIS ALGORITHM: RPR Ser Ql: NONREACTIVE

## 2018-07-21 MED ORDER — ZOLPIDEM TARTRATE 5 MG PO TABS: 5.0000 mg | ORAL_TABLET | Freq: Every evening | ORAL | Status: DC | PRN

## 2018-07-21 MED ORDER — SIMETHICONE 80 MG PO CHEW: 80.0000 mg | CHEWABLE_TABLET | ORAL | Status: DC | PRN

## 2018-07-21 MED ORDER — BENZOCAINE-MENTHOL 20-0.5 % EX AERO
1.0000 "application " | INHALATION_SPRAY | CUTANEOUS | Status: DC | PRN
  Administered 2018-07-21: 1 via TOPICAL
  Filled 2018-07-21: qty 56

## 2018-07-21 MED ORDER — PRENATAL MULTIVITAMIN CH
1.0000 | ORAL_TABLET | Freq: Every day | ORAL | Status: DC
  Administered 2018-07-21 – 2018-07-23 (×3): 1 via ORAL
  Filled 2018-07-21 (×3): qty 1

## 2018-07-21 MED ORDER — ONDANSETRON HCL 4 MG/2ML IJ SOLN: 4.0000 mg | INTRAMUSCULAR | Status: DC | PRN

## 2018-07-21 MED ORDER — DIPHENHYDRAMINE HCL 25 MG PO CAPS: 25.0000 mg | ORAL_CAPSULE | Freq: Four times a day (QID) | ORAL | Status: DC | PRN

## 2018-07-21 MED ORDER — TETANUS-DIPHTH-ACELL PERTUSSIS 5-2.5-18.5 LF-MCG/0.5 IM SUSP: 0.5000 mL | Freq: Once | INTRAMUSCULAR | Status: DC

## 2018-07-21 MED ORDER — COCONUT OIL OIL
1.0000 "application " | TOPICAL_OIL | Status: DC | PRN
  Administered 2018-07-22: 1 via TOPICAL

## 2018-07-21 MED ORDER — WITCH HAZEL-GLYCERIN EX PADS: 1.0000 "application " | MEDICATED_PAD | CUTANEOUS | Status: DC | PRN

## 2018-07-21 MED ORDER — ONDANSETRON HCL 4 MG PO TABS: 4.0000 mg | ORAL_TABLET | ORAL | Status: DC | PRN

## 2018-07-21 MED ORDER — SENNOSIDES-DOCUSATE SODIUM 8.6-50 MG PO TABS
2.0000 | ORAL_TABLET | ORAL | Status: DC
  Administered 2018-07-22 (×2): 2 via ORAL
  Filled 2018-07-21 (×2): qty 2

## 2018-07-21 MED ORDER — IBUPROFEN 600 MG PO TABS
600.0000 mg | ORAL_TABLET | Freq: Four times a day (QID) | ORAL | Status: DC
  Administered 2018-07-21 – 2018-07-23 (×9): 600 mg via ORAL
  Filled 2018-07-21 (×9): qty 1

## 2018-07-21 MED ORDER — ACETAMINOPHEN 325 MG PO TABS
650.0000 mg | ORAL_TABLET | ORAL | Status: DC | PRN
  Administered 2018-07-21 – 2018-07-23 (×5): 650 mg via ORAL
  Filled 2018-07-21 (×5): qty 2

## 2018-07-21 MED ORDER — OXYCODONE HCL 5 MG PO TABS
5.0000 mg | ORAL_TABLET | ORAL | Status: DC | PRN
  Administered 2018-07-21 – 2018-07-23 (×6): 5 mg via ORAL
  Filled 2018-07-21 (×6): qty 1

## 2018-07-21 MED ORDER — DIBUCAINE (PERIANAL) 1 % EX OINT: 1.0000 "application " | TOPICAL_OINTMENT | CUTANEOUS | Status: DC | PRN

## 2018-07-21 MED ORDER — MEASLES, MUMPS & RUBELLA VAC IJ SOLR: 0.5000 mL | Freq: Once | INTRAMUSCULAR | Status: DC

## 2018-07-21 NOTE — Progress Notes (Signed)
10 Instruments 5 9*9 5 4*18 2 Injectables  

## 2018-07-21 NOTE — Progress Notes (Signed)
LABOR PROGRESS NOTE  Annette Greene is a 28 y.o. G2P0101 at [redacted]w[redacted]d  admitted for SOL.   Subjective: Patient comfortable with epidural. No concerns.   Objective: BP 115/71   Pulse 64   Temp 98.8 F (37.1 C)   Resp 14   LMP 10/13/2017 (Exact Date)   SpO2 100%  or  Vitals:   07/21/18 0301 07/21/18 0331 07/21/18 0401 07/21/18 0431  BP: (!) 105/54 (!) 101/52 (!) 100/45 115/71  Pulse: (!) 141 67 67 64  Resp: 14 14 14 14   Temp:      TempSrc:      SpO2:        Dilation: 7 Effacement (%): 90 Cervical Position: Middle Station: -1 Presentation: Vertex Exam by:: dr Ranee Peasley FHT: baseline rate 130, minimal to moderate varibility, +acel, no decel Toco: q3-5 min   Labs: Lab Results  Component Value Date   WBC 9.0 07/20/2018   HGB 10.0 (L) 07/20/2018   HCT 31.9 (L) 07/20/2018   MCV 80.6 07/20/2018   PLT 282 07/20/2018    Patient Active Problem List   Diagnosis Date Noted  . Indication for care in labor or delivery 07/20/2018  . Human papilloma virus infection 06/02/2018  . Irregular periods 06/02/2018  . Abdominal pain affecting pregnancy 02/06/2018  . Obesity, morbid ([place]) 12/27/2017  . History of herpes simplex type 2 infection 12/27/2017  . Supervision of high risk pregnancy, antepartum 12/27/2017  . Premature delivery 12/10/2016  . Incompetent cervix in pregnancy, antepartum 11/24/2016  . Cyst of ovary 08/17/2016    Assessment / Plan: 28 y.o. G2P0101 at [redacted]w[redacted]d here for SOL.   Labor: Progressing well. AROM performed at 0430 with clear fluid.  Fetal Wellbeing:  Cat I-II with periods of minimal variability but still having acels and no decels  Pain Control:  Epidural in place  Anticipated MOD:  NSVD   Phill Myron, D.O. OB Fellow  07/21/2018, 4:42 AM

## 2018-07-21 NOTE — Progress Notes (Signed)
I have reviewed the chart and agree with nursing staff's documentation of this patient's encounter.  Aletha Halim, MD 07/21/2018 8:10 AM

## 2018-07-21 NOTE — Anesthesia Postprocedure Evaluation (Signed)
Anesthesia Post Note  Patient: Annette Greene  Procedure(s) Performed: AN AD HOC LABOR EPIDURAL     Patient location during evaluation: Mother Baby Anesthesia Type: Epidural Level of consciousness: awake, awake and alert, oriented and patient cooperative Pain management: pain level controlled Vital Signs Assessment: post-procedure vital signs reviewed and stable Respiratory status: spontaneous breathing, nonlabored ventilation and respiratory function stable Cardiovascular status: stable Postop Assessment: no headache, no backache, no apparent nausea or vomiting and patient able to bend at knees Anesthetic complications: no Comments: Interview over the phone.    Last Vitals:  Vitals:   07/21/18 1050 07/21/18 1500  BP: 113/71 (!) 106/56  Pulse: 66 74  Resp: 18 18  Temp: (!) 36.2 C 36.8 C  SpO2: 98%     Last Pain:  Vitals:   07/21/18 1500  TempSrc: Oral  PainSc: 6    Pain Goal: Patients Stated Pain Goal: 3 (07/21/18 1500)                 [name] L

## 2018-07-21 NOTE — Lactation Note (Signed)
This note was copied from a baby's chart. Lactation Consultation Note  Patient Name: Boy Anelise Staron [id number] Date: 07/21/2018 Reason for consult: Initial assessment;Early term 77-38.6wks Baby is 6 hours old.  Mom reports that baby has been latching often.  Observed mom latch baby easily in cradle hold.  Mom supports breast well for deep latch.  Baby feeding well with swallows.  Mom states she knows how to hand express.  Discussed milk coming to volume.  Instructed to feed with cues and call for assist prn.  Breastfeeding consultation services and support information given and reviewed.  Maternal Data Has patient been taught Hand Expression?: Yes  Feeding Feeding Type: Breast Fed  LATCH Score Latch: Grasps breast easily, tongue down, lips flanged, rhythmical sucking.  Audible Swallowing: A few with stimulation  Type of Nipple: Everted at rest and after stimulation  Comfort (Breast/Nipple): Soft / non-tender  Hold (Positioning): No assistance needed to correctly position infant at breast.  LATCH Score: 9  Interventions Interventions: Breast feeding basics reviewed  Lactation Tools Discussed/Used     Consult Status Consult Status: Follow-up Date: 07/22/18 Follow-up type: In-patient    Ave Filter 07/21/2018, 2:03 PM

## 2018-07-21 NOTE — Discharge Summary (Deleted)
OB Discharge Summary     Patient Name: Annette Greene DOB: 1991/01/20 MRN: [id number]  Date of admission: 07/20/2018 Delivering MD: Nicolette Bang   Date of discharge: 07/22/2018  Admitting diagnosis: CTX 3 TO 4 MINS APART Intrauterine pregnancy: [redacted]w[redacted]d     Secondary diagnosis:  Active Problems:   Incompetent cervix in pregnancy, antepartum   Premature delivery   History of herpes simplex type 2 infection   Indication for care in labor or delivery   SVD (spontaneous vaginal delivery)      Discharge diagnosis: Term Pregnancy Delivered                                                                                                Post partum procedures:None  Augmentation: AROM  Complications: None  Hospital course:  Onset of Labor With Vaginal Delivery     28 y.o. yo G2P0101 at [redacted]w[redacted]d was admitted in Active Labor on 07/20/2018. Patient had an uncomplicated labor course as follows:  Membrane Rupture Time/Date: 4:29 AM ,07/21/2018   Intrapartum Procedures: Episiotomy: None [1]                                         Lacerations:  None [1]  Patient had a delivery of a Viable infant. 07/21/2018  Information for the patient's newborn:  Makaria, Poarch [[id number]]  Delivery Method: Vaginal, Spontaneous(Filed from Delivery Summary)    Pateint had an uncomplicated postpartum course.  She is ambulating, tolerating a regular diet, passing flatus, and urinating well. Patient is discharged home in stable condition on 07/22/18.   Physical exam  Vitals:   07/21/18 1500 07/21/18 1813 07/21/18 2141 07/22/18 0542  BP: (!) 106/56 (!) 101/38 116/72 123/76  Pulse: 74 75 72 76  Resp: 18 18 18 16   Temp: 98.3 F (36.8 C) 98.3 F (36.8 C) 98.2 F (36.8 C) 98.1 F (36.7 C)  TempSrc: Oral Oral Oral Oral  SpO2:    98%   General: alert and cooperative Lochia: appropriate Uterine Fundus: firm Incision: N/A DVT Evaluation: No evidence of DVT seen on physical exam. Labs: Lab Results   Component Value Date   WBC 9.0 07/20/2018   HGB 10.0 (L) 07/20/2018   HCT 31.9 (L) 07/20/2018   MCV 80.6 07/20/2018   PLT 282 07/20/2018   CMP Latest Ref Rng & Units 07/12/2018  Glucose 70 - 99 mg/dL 95  BUN 6 - 20 mg/dL 6  Creatinine 0.44 - 1.00 mg/dL 0.75  Sodium 135 - 145 mmol/L 135  Potassium 3.5 - 5.1 mmol/L 3.6  Chloride 98 - 111 mmol/L 104  CO2 22 - 32 mmol/L 21(L)  Calcium 8.9 - 10.3 mg/dL 8.8(L)  Total Protein 6.5 - 8.1 g/dL 6.2(L)  Total Bilirubin 0.3 - 1.2 mg/dL 0.3  Alkaline Phos 38 - 126 U/L 77  AST 15 - 41 U/L 24  ALT 0 - 44 U/L 18    Discharge instruction: per After Visit Summary and "Baby and Me Booklet".  After  visit meds:  Allergies as of 07/22/2018   No Known Allergies     Medication List    STOP taking these medications   valACYclovir 500 MG tablet Commonly known as:  VALTREX     TAKE these medications   acetaminophen 500 MG tablet Commonly known as:  TYLENOL Take 500 mg by mouth every 6 (six) hours as needed for headache.   cyclobenzaprine 5 MG tablet Commonly known as:  FLEXERIL Take 1 tablet (5 mg total) by mouth 3 (three) times daily as needed for muscle spasms.   ibuprofen 800 MG tablet Commonly known as:  ADVIL Take 1 tablet (800 mg total) by mouth every 8 (eight) hours as needed.   multivitamin-prenatal 27-0.8 MG Tabs tablet Take 1 tablet by mouth daily.   ondansetron 4 MG disintegrating tablet Commonly known as:  Zofran ODT Take 1 tablet (4 mg total) by mouth every 8 (eight) hours as needed for nausea or vomiting.   senna-docusate 8.6-50 MG tablet Commonly known as:  Senokot-S Take 2 tablets by mouth daily. Start taking on:  Jul 23, 2018       Diet: routine diet  Activity: Advance as tolerated. Pelvic rest for 6 weeks.   Outpatient follow up:4 weeks Follow up Appt: Future Appointments  Date Time Provider [place]  07/24/2018  3:55 PM Emily Filbert, MD [facility]   Follow up Visit:No follow-ups on  file. Please schedule this patient for Postpartum visit in: 4 weeks with the following provider: Any provider For C/S patients schedule nurse incision check in weeks 2 weeks: no High risk pregnancy complicated by: previous PTD with cerclage and 17-P injections, HSV on valtrex  Delivery mode:  SVD Anticipated Birth Control:  Nexplanon PP Procedures needed: None  Schedule Integrated BH visit: no    Postpartum contraception: Nexplanon  Newborn Data: Live born female  Birth Weight:   APGAR: 66, 9  Newborn Delivery   Birth date/time:  07/21/2018 08:00:00 Delivery type:  Vaginal, Spontaneous     Baby Feeding: Breast Disposition:home with mother   07/22/2018 Melina Schools, DO

## 2018-07-22 MED ORDER — SENNOSIDES-DOCUSATE SODIUM 8.6-50 MG PO TABS: 2.0000 | ORAL_TABLET | ORAL | 0 refills | Status: DC

## 2018-07-22 MED ORDER — CYCLOBENZAPRINE HCL 5 MG PO TABS: 5.0000 mg | ORAL_TABLET | Freq: Three times a day (TID) | ORAL | 0 refills | Status: DC | PRN

## 2018-07-22 MED ORDER — IBUPROFEN 800 MG PO TABS: 800.0000 mg | ORAL_TABLET | Freq: Three times a day (TID) | ORAL | 1 refills | Status: DC | PRN

## 2018-07-22 NOTE — Discharge Instructions (Signed)
Vaginal Delivery, Care After °Refer to this sheet in the next few weeks. These instructions provide you with information about caring for yourself after vaginal delivery. Your health care provider may also give you more specific instructions. Your treatment has been planned according to current medical practices, but problems sometimes occur. Call your health care provider if you have any problems or questions. °What can I expect after the procedure? °After vaginal delivery, it is common to have: °· Some bleeding from your vagina. °· Soreness in your abdomen, your vagina, and the area of skin between your vaginal opening and your anus (perineum). °· Pelvic cramps. °· Fatigue. °Follow these instructions at home: °Medicines °· Take over-the-counter and prescription medicines only as told by your health care provider. °· If you were prescribed an antibiotic medicine, take it as told by your health care provider. Do not stop taking the antibiotic until it is finished. °Driving ° °· Do not drive or operate heavy machinery while taking prescription pain medicine. °· Do not drive for 24 hours if you received a sedative. °Lifestyle °· Do not drink alcohol. This is especially important if you are breastfeeding or taking medicine to relieve pain. °· Do not use tobacco products, including cigarettes, chewing tobacco, or e-cigarettes. If you need help quitting, ask your health care provider. °Eating and drinking °· Drink at least 8 eight-ounce glasses of water every day unless you are told not to by your health care provider. If you choose to breastfeed your baby, you may need to drink more water than this. °· Eat high-fiber foods every day. These foods may help prevent or relieve constipation. High-fiber foods include: °? Whole grain cereals and breads. °? Brown rice. °? Beans. °? Fresh fruits and vegetables. °Activity °· Return to your normal activities as told by your health care provider. Ask your health care provider what  activities are safe for you. °· Rest as much as possible. Try to rest or take a nap when your baby is sleeping. °· Do not lift anything that is heavier than your baby or 10 lb (4.5 kg) until your health care provider says that it is safe. °· Talk with your health care provider about when you can engage in sexual activity. This may depend on your: °? Risk of infection. °? Rate of healing. °? Comfort and desire to engage in sexual activity. °Vaginal Care °· If you have an episiotomy or a vaginal tear, check the area every day for signs of infection. Check for: °? More redness, swelling, or pain. °? More fluid or blood. °? Warmth. °? Pus or a bad smell. °· Do not use tampons or douches until your health care provider says this is safe. °· Watch for any blood clots that may pass from your vagina. These may look like clumps of dark red, brown, or black discharge. °General instructions °· Keep your perineum clean and dry as told by your health care provider. °· Wear loose, comfortable clothing. °· Wipe from front to back when you use the toilet. °· Ask your health care provider if you can shower or take a bath. If you had an episiotomy or a perineal tear during labor and delivery, your health care provider may tell you not to take baths for a certain length of time. °· Wear a bra that supports your breasts and fits you well. °· If possible, have someone help you with household activities and help care for your baby for at least a few days after you   leave the hospital. °· Keep all follow-up visits for you and your baby as told by your health care provider. This is important. °Contact a health care provider if: °· You have: °? Vaginal discharge that has a bad smell. °? Difficulty urinating. °? Pain when urinating. °? A sudden increase or decrease in the frequency of your bowel movements. °? More redness, swelling, or pain around your episiotomy or vaginal tear. °? More fluid or blood coming from your episiotomy or vaginal  tear. °? Pus or a bad smell coming from your episiotomy or vaginal tear. °? A fever. °? A rash. °? Little or no interest in activities you used to enjoy. °? Questions about caring for yourself or your baby. °· Your episiotomy or vaginal tear feels warm to the touch. °· Your episiotomy or vaginal tear is separating or does not appear to be healing. °· Your breasts are painful, hard, or turn red. °· You feel unusually sad or worried. °· You feel nauseous or you vomit. °· You pass large blood clots from your vagina. If you pass a blood clot from your vagina, save it to show to your health care provider. Do not flush blood clots down the toilet without having your health care provider look at them. °· You urinate more than usual. °· You are dizzy or light-headed. °· You have not breastfed at all and you have not had a menstrual period for 12 weeks after delivery. °· You have stopped breastfeeding and you have not had a menstrual period for 12 weeks after you stopped breastfeeding. °Get help right away if: °· You have: °? Pain that does not go away or does not get better with medicine. °? Chest pain. °? Difficulty breathing. °? Blurred vision or spots in your vision. °? Thoughts about hurting yourself or your baby. °· You develop pain in your abdomen or in one of your legs. °· You develop a severe headache. °· You faint. °· You bleed from your vagina so much that you fill two sanitary pads in one hour. °This information is not intended to replace advice given to you by your health care provider. Make sure you discuss any questions you have with your health care provider. °Document Released: 03/05/2000 Document Revised: 08/20/2015 Document Reviewed: 03/23/2015 °Elsevier Interactive Patient Education © 2019 [employer] ° °

## 2018-07-22 NOTE — Progress Notes (Signed)
Post Partum Day 1 Subjective: Patient was discharged this morning by Dr. Phill Myron. Infant not to be discharged and patient requesting to stay.  Objective: Blood pressure 136/72, pulse (!) 57, temperature 98.3 F (36.8 C), temperature source Oral, resp. rate 15, last menstrual period 10/13/2017, SpO2 100 %, unknown if currently breastfeeding.  Physical Exam: *Exam per Dr. Jobe Gibbon from discharge summary initially signed 07/22/18 am* General: alert and cooperative Lochia: appropriate Uterine Fundus: firm Incision: N/A DVT Evaluation: No evidence of DVT seen on physical exam.  Recent Labs    07/20/18 2114  HGB 10.0*  HCT 31.9*    Assessment/Plan: Okay to stay until PPD#2 Discharge order cancelled Routine postpartum care   LOS: 2 days   Glenice Bow, DO 07/22/2018, 3:42 PM

## 2018-07-22 NOTE — Discharge Summary (Signed)
OB Discharge Summary     Patient Name: Annette Greene DOB: 1990/09/23 MRN: [id number]  Date of admission: 07/20/2018 Delivering MD: Nicolette Bang   Date of discharge: 07/23/2018  Admitting diagnosis: CTX 3 TO 4 MINS APART Intrauterine pregnancy: 100w5d     Secondary diagnosis:  Active Problems:   Incompetent cervix in pregnancy, antepartum   Premature delivery   History of herpes simplex type 2 infection   Indication for care in labor or delivery   SVD (spontaneous vaginal delivery)      Discharge diagnosis: Term Pregnancy Delivered                                                                                                Post partum procedures:None  Augmentation: AROM  Complications: None  Hospital course:  Onset of Labor With Vaginal Delivery     28 y.o. yo G2P0101 at [redacted]w[redacted]d was admitted in Active Labor on 07/20/2018. Patient had an uncomplicated labor course as follows:  Membrane Rupture Time/Date: 4:29 AM ,07/21/2018   Intrapartum Procedures: Episiotomy: None [1]                                         Lacerations:  None [1]  Patient had a delivery of a Viable infant. 07/21/2018  Information for the patient's newborn:  Shalom, Ware [[id number]]  Delivery Method: Vaginal, Spontaneous(Filed from Delivery Summary)    Pateint had an uncomplicated postpartum course.  She is ambulating, tolerating a regular diet, passing flatus, and urinating well. Patient is discharged home in stable condition on 07/23/18.   Physical exam  Vitals:   07/21/18 1500 07/21/18 1813 07/21/18 2141 07/22/18 0542  BP: (!) 106/56 (!) 101/38 116/72 123/76  Pulse: 74 75 72 76  Resp: 18 18 18 16   Temp: 98.3 F (36.8 C) 98.3 F (36.8 C) 98.2 F (36.8 C) 98.1 F (36.7 C)  TempSrc: Oral Oral Oral Oral  SpO2:    98%   General: alert and cooperative Lochia: appropriate Uterine Fundus: firm Incision: N/A DVT Evaluation: No evidence of DVT seen on physical exam. Labs: Lab Results   Component Value Date   WBC 9.0 07/20/2018   HGB 10.0 (L) 07/20/2018   HCT 31.9 (L) 07/20/2018   MCV 80.6 07/20/2018   PLT 282 07/20/2018   CMP Latest Ref Rng & Units 07/12/2018  Glucose 70 - 99 mg/dL 95  BUN 6 - 20 mg/dL 6  Creatinine 0.44 - 1.00 mg/dL 0.75  Sodium 135 - 145 mmol/L 135  Potassium 3.5 - 5.1 mmol/L 3.6  Chloride 98 - 111 mmol/L 104  CO2 22 - 32 mmol/L 21(L)  Calcium 8.9 - 10.3 mg/dL 8.8(L)  Total Protein 6.5 - 8.1 g/dL 6.2(L)  Total Bilirubin 0.3 - 1.2 mg/dL 0.3  Alkaline Phos 38 - 126 U/L 77  AST 15 - 41 U/L 24  ALT 0 - 44 U/L 18    Discharge instruction: per After Visit Summary and "Baby and Me Booklet".  After  visit meds:  Allergies as of 07/22/2018   No Known Allergies     Medication List    STOP taking these medications   valACYclovir 500 MG tablet Commonly known as:  VALTREX     TAKE these medications   acetaminophen 500 MG tablet Commonly known as:  TYLENOL Take 500 mg by mouth every 6 (six) hours as needed for headache.   cyclobenzaprine 5 MG tablet Commonly known as:  FLEXERIL Take 1 tablet (5 mg total) by mouth 3 (three) times daily as needed for muscle spasms.   ibuprofen 800 MG tablet Commonly known as:  ADVIL Take 1 tablet (800 mg total) by mouth every 8 (eight) hours as needed.   multivitamin-prenatal 27-0.8 MG Tabs tablet Take 1 tablet by mouth daily.   ondansetron 4 MG disintegrating tablet Commonly known as:  Zofran ODT Take 1 tablet (4 mg total) by mouth every 8 (eight) hours as needed for nausea or vomiting.   senna-docusate 8.6-50 MG tablet Commonly known as:  Senokot-S Take 2 tablets by mouth daily. Start taking on:  Jul 23, 2018       Diet: routine diet  Activity: Advance as tolerated. Pelvic rest for 6 weeks.   Outpatient follow up:4 weeks Follow up Appt: Future Appointments  Date Time Provider [place]  07/24/2018  3:55 PM Emily Filbert, MD [facility]   Follow up Visit:No follow-ups on  file. Please schedule this patient for Postpartum visit in: 4 weeks with the following provider: Any provider For C/S patients schedule nurse incision check in weeks 2 weeks: no High risk pregnancy complicated by: previous PTD with cerclage and 17-P injections, HSV on valtrex  Delivery mode:  SVD Anticipated Birth Control:  Nexplanon PP Procedures needed: None  Schedule Integrated BH visit: no    Postpartum contraception: Nexplanon  Newborn Data: Live born female  Birth Weight:   APGAR: 72, 9  Newborn Delivery   Birth date/time:  07/21/2018 08:00:00 Delivery type:  Vaginal, Spontaneous     Baby Feeding: Breast Disposition:home with mother  Phill Myron, D.O. [facility] Family Medicine Fellow, [facility] for [facility], [place] 07/23/2018, 8:09 AM

## 2018-07-23 NOTE — Lactation Note (Signed)
This note was copied from a baby's chart. Lactation Consultation Note  Patient Name: Annette Greene [id number]'Q Date: 07/23/2018 Reason for consult: Follow-up assessment;Infant weight loss  Baby is 71 hours old  As LC entered the room mom resting in bed holding abby and he was starting to wake up.  LC offered to assist with pillow support. Mom desired to latch on the breast / football/  LC worked on latching and depth. Per mom that is deeper than the baby has latched. Increased  Swallows noted and especially with compressions. LC discussed the importance of nutritive vs non- nutritive  Feeding patterns and watching the baby for hanging out latched. Importance of STS feedings until the baby is back to  Birth weight and gaining steadily.  Sore nipple and engorgement prevention and tx reviewed.  Per mom has a DEBP at home and is active with [facility] .  Mom aware of the virtual BFSG and to sign up if interested/ the breast feeding phone line and the [place] O/P by appt.  Has the [facility] services pamphlet.   Mom receptive to teaching / latch assist/ and expressed appreciation of assistance.   Maternal Data Has patient been taught Hand Expression?: Yes Does the patient have breastfeeding experience prior to this delivery?: Yes  Feeding Feeding Type: Breast Fed  LATCH Score Latch: Grasps breast easily, tongue down, lips flanged, rhythmical sucking.  Audible Swallowing: Spontaneous and intermittent  Type of Nipple: Everted at rest and after stimulation  Comfort (Breast/Nipple): Soft / non-tender  Hold (Positioning): Assistance needed to correctly position infant at breast and maintain latch.  LATCH Score: 9  Interventions Interventions: Breast feeding basics reviewed;Assisted with latch;Skin to skin;Breast massage;Hand express;Breast compression;Reverse pressure;Adjust position;Support pillows;Position options;Expressed milk  Lactation Tools Discussed/Used WIC Program: Yes Pump Review: Milk  Storage   Consult Status Consult Status: Complete Date: 07/23/18    Myer Haff 07/23/2018, 9:20 AM

## 2018-07-24 ENCOUNTER — Ambulatory Visit: Payer: Medicaid Other | Admitting: Obstetrics & Gynecology

## 2018-07-24 DIAGNOSIS — O3430 Maternal care for cervical incompetence, unspecified trimester: Secondary | ICD-10-CM

## 2018-07-24 DIAGNOSIS — Z8619 Personal history of other infectious and parasitic diseases: Secondary | ICD-10-CM

## 2018-07-24 DIAGNOSIS — O0990 Supervision of high risk pregnancy, unspecified, unspecified trimester: Secondary | ICD-10-CM

## 2018-07-24 NOTE — Progress Notes (Signed)
DNKA- delivered 07-21-18

## 2018-07-24 NOTE — Patient Instructions (Signed)
Places to have your son circumcised:                                                                      [facility]     [phone] while you are in hospital         [facility]              [phone]   $269 by 4 wks                      [facility]                     [phone]   $269 by 7 days [facility]                    [phone]   $269 by 4 wks [facility]             [phone]   $175 by 2 wks    These prices sometimes change but are roughly what you can expect to pay. Please call and confirm pricing.   Circumcision is considered an elective/non-medically necessary procedure. There are many reasons parents decide to have their sons circumsized. During the first year of life circumcised males have a reduced risk of urinary tract infections but after this year the rates between circumcised males and uncircumcised males are the same.  It is safe to have your son circumcised outside of the hospital and the places above perform them regularly.   Deciding about Circumcision in Baby Boys  (Up-to-date The Basics)  What is circumcision?   Circumcision is a surgery that removes the skin that covers the tip of the penis, called the "foreskin" Circumcision is usually done when a boy is between 69 and 65 days old. In the [place], circumcision is common. In some other countries, fewer boys are circumcised. Circumcision is a common tradition in some religions.  Should I have my baby boy circumcised?   There is no easy answer. Circumcision has some benefits. But it also has risks. After talking with your doctor, you will have to decide for yourself what is right for your family.  What are the benefits of circumcision?   Circumcised boys seem to have slightly lower rates of: ?Urinary tract infections ?Swelling of the opening at the tip of the penis Circumcised men seem to have slightly lower rates of: ?Urinary tract infections ?Swelling of the opening at the tip of the penis ?Penis cancer ?HIV  and other infections that you catch during sex ?Cervical cancer in the women they have sex with Even so, in the [place], the risks of these problems are small - even in boys and men who have not been circumcised. Plus, boys and men who are not circumcised can reduce these extra risks by: ?Cleaning their penis well ?Using condoms during sex  What are the risks of circumcision?  Risks include: ?Bleeding or infection from the surgery ?Damage to or amputation of the penis ?A chance that the doctor will cut off too much or not enough of the foreskin ?A chance that sex won't feel as good later in life Only about 1 out of every 200  circumcisions leads to problems. There is also a chance that your health insurance won't pay for circumcision.  How is circumcision done in baby boys?  First, the baby gets medicine for pain relief. This might be a cream on the skin or a shot into the base of the penis. Next, the doctor cleans the baby's penis well. Then he or she uses special tools to cut off the foreskin. Finally, the doctor wraps a bandage (called gauze) around the baby's penis. If you have your baby circumcised, his doctor or nurse will give you instructions on how to care for him after the surgery. It is important that you follow those instructions carefully.

## 2018-08-18 ENCOUNTER — Ambulatory Visit: Payer: Medicaid Other | Admitting: Obstetrics & Gynecology

## 2018-08-18 ENCOUNTER — Ambulatory Visit: Payer: Medicaid Other | Admitting: Obstetrics and Gynecology

## 2018-08-22 ENCOUNTER — Ambulatory Visit: Payer: Medicaid Other | Admitting: Student

## 2018-08-22 ENCOUNTER — Telehealth: Payer: Self-pay

## 2018-08-22 NOTE — Telephone Encounter (Signed)
The patient called in to cancel the appointment. She stated she just left the hospital with her child who is very sick. She will call at a later time to reschedule.

## 2018-08-25 ENCOUNTER — Other Ambulatory Visit: Payer: Self-pay

## 2018-08-25 ENCOUNTER — Emergency Department (HOSPITAL_BASED_OUTPATIENT_CLINIC_OR_DEPARTMENT_OTHER)
Admission: EM | Admit: 2018-08-25 | Discharge: 2018-08-25 | Disposition: A | Discharge status: Home / Self Care | Payer: Managed Care, Other (non HMO) | Attending: Emergency Medicine | Admitting: Emergency Medicine

## 2018-08-25 ENCOUNTER — Encounter (HOSPITAL_BASED_OUTPATIENT_CLINIC_OR_DEPARTMENT_OTHER): Payer: Self-pay | Admitting: Emergency Medicine

## 2018-08-25 DIAGNOSIS — L0231 Cutaneous abscess of buttock: Secondary | ICD-10-CM | POA: Insufficient documentation

## 2018-08-25 DIAGNOSIS — Z87891 Personal history of nicotine dependence: Secondary | ICD-10-CM | POA: Diagnosis not present

## 2018-08-25 DIAGNOSIS — Z79899 Other long term (current) drug therapy: Secondary | ICD-10-CM | POA: Insufficient documentation

## 2018-08-25 DIAGNOSIS — L02412 Cutaneous abscess of left axilla: Secondary | ICD-10-CM | POA: Diagnosis not present

## 2018-08-25 DIAGNOSIS — L0291 Cutaneous abscess, unspecified: Secondary | ICD-10-CM

## 2018-08-25 DIAGNOSIS — R2232 Localized swelling, mass and lump, left upper limb: Secondary | ICD-10-CM | POA: Diagnosis present

## 2018-08-25 MED ORDER — DOXYCYCLINE HYCLATE 100 MG PO CAPS: 100.0000 mg | ORAL_CAPSULE | Freq: Two times a day (BID) | ORAL | 0 refills | Status: DC

## 2018-08-25 NOTE — ED Triage Notes (Signed)
Abscess to L axilla and buttocks.

## 2018-08-25 NOTE — ED Notes (Signed)
Chaperone for Dr Tomi Bamberger for evaluation of areas left axilla and central buttocks

## 2018-08-25 NOTE — ED Provider Notes (Signed)
[facility] Provider Note   CSN: [id number] Arrival date & time: 08/25/18  1135    History   Chief Complaint Chief Complaint  Patient presents with  . Abscess    HPI Annette Greene is a 28 y.o. female.     HPI Patient presents to the emergency room for evaluation of 2 areas of swelling.  Patient noticed an area of tenderness and swelling in her left armpit.  She also noticed another area at the very top of her gluteal cleft.  Patient states she has had this type of trouble before.  Her mother is also had similar issues.  Patient's had small areas of swelling in her armpit that in the past spontaneously drained.  She has noticed the symptoms over the last several days.  It is becoming more tender.  She denies any fevers or chills.  No drainage. Past Medical History:  Diagnosis Date  . Herpes   . Hx of chlamydia infection   . Infection    UTI  . Preterm labor     Patient Active Problem List   Diagnosis Date Noted  . SVD (spontaneous vaginal delivery) 07/21/2018  . Indication for care in labor or delivery 07/20/2018  . Human papilloma virus infection 06/02/2018  . Irregular periods 06/02/2018  . Abdominal pain affecting pregnancy 02/06/2018  . Obesity, morbid ([place]) 12/27/2017  . History of herpes simplex type 2 infection 12/27/2017  . Supervision of high risk pregnancy, antepartum 12/27/2017  . Premature delivery 12/10/2016  . Incompetent cervix in pregnancy, antepartum 11/24/2016  . Cyst of ovary 08/17/2016    Past Surgical History:  Procedure Laterality Date  . CERVICAL CERCLAGE N/A 01/25/2018   Procedure: CERCLAGE CERVICAL;  Surgeon: Everett Graff, MD;  Location: [facility];  Service: Gynecology;  Laterality: N/A;  . TONSILLECTOMY AND ADENOIDECTOMY    . WISDOM TOOTH EXTRACTION     all 4 teeth     OB History    Gravida  2   Para  2   Term  1   Preterm  1   AB      Living  2     SAB      TAB      Ectopic     Multiple  0   Live Births  2            Home Medications    Prior to Admission medications   Medication Sig Start Date End Date Taking? Authorizing Provider  acetaminophen (TYLENOL) 500 MG tablet Take 500 mg by mouth every 6 (six) hours as needed for headache.    [provider]  cyclobenzaprine (FLEXERIL) 5 MG tablet Take 1 tablet (5 mg total) by mouth 3 (three) times daily as needed for muscle spasms. Patient not taking: Reported on 07/24/2018 07/22/18   Nicolette Bang, DO  doxycycline (VIBRAMYCIN) 100 MG capsule Take 1 capsule (100 mg total) by mouth 2 (two) times daily. 08/25/18   Dorie Rank, MD  ibuprofen (ADVIL) 800 MG tablet Take 1 tablet (800 mg total) by mouth every 8 (eight) hours as needed. Patient not taking: Reported on 07/24/2018 07/22/18   Nicolette Bang, DO  ondansetron (ZOFRAN ODT) 4 MG disintegrating tablet Take 1 tablet (4 mg total) by mouth every 8 (eight) hours as needed for nausea or vomiting. Patient not taking: Reported on 07/24/2018 04/20/18   Julianne Handler, CNM  Prenatal Vit-Fe Fumarate-FA (MULTIVITAMIN-PRENATAL) 27-0.8 MG TABS tablet Take 1 tablet by mouth  daily.    [provider]  senna-docusate (SENOKOT-S) 8.6-50 MG tablet Take 2 tablets by mouth daily. Patient not taking: Reported on 07/24/2018 07/23/18   Nicolette Bang, DO    Family History Family History  Problem Relation Age of Onset  . Healthy Mother   . Hypertension Mother   . Healthy Father   . Healthy Sister   . Healthy Maternal Grandmother   . Hypertension Maternal Grandmother   . Healthy Paternal Grandmother   . Healthy Paternal Grandfather   . Healthy Sister   . Drug abuse Maternal Aunt   . Hypertension Maternal Aunt     Social History Social History   Tobacco Use  . Smoking status: Former Smoker    Packs/day: 0.50    Types: Cigarettes    Last attempt to quit: 06/24/2016    Years since quitting: 2.1  . Smokeless tobacco: Never Used  .  Tobacco comment: stopped since pregnant  Substance Use Topics  . Alcohol use: Not Currently    Comment: stopped with pregnancy  . Drug use: Not Currently     Allergies   Patient has no known allergies.   Review of Systems Review of Systems  All other systems reviewed and are negative.    Physical Exam Updated Vital Signs BP 129/79 (BP Location: Right Arm)   Pulse 72   Temp 98.2 F (36.8 C) (Oral)   Resp 20   Ht 1.702 m (5\' 7" )   Wt 123.8 kg   SpO2 100%   BMI 42.76 kg/m   Physical Exam Vitals signs and nursing note reviewed.  Constitutional:      General: She is not in acute distress.    Appearance: She is well-developed.  HENT:     Head: Normocephalic and atraumatic.     Right Ear: External ear normal.     Left Ear: External ear normal.  Eyes:     General: No scleral icterus.       Right eye: No discharge.        Left eye: No discharge.     Conjunctiva/sclera: Conjunctivae normal.  Neck:     Musculoskeletal: Neck supple.     Trachea: No tracheal deviation.  Cardiovascular:     Rate and Rhythm: Normal rate.  Pulmonary:     Effort: Pulmonary effort is normal. No respiratory distress.     Breath sounds: No stridor.  Abdominal:     General: There is no distension.  Musculoskeletal:        General: No swelling or deformity.     Comments: Left axillary region some evidence of scar tissue suggestive of prior infections and possible hidradenitis, small pea-sized area of fluctuance, no erythema; superior aspect of gluteal cleft with an area approximately 1 to 2 cm of induration, no significant fluctuance, erythema noted  Skin:    General: Skin is warm and dry.     Findings: No rash.  Neurological:     Mental Status: She is alert.     Cranial Nerves: Cranial nerve deficit: no gross deficits.      ED Treatments / Results  Labs (all labs ordered are listed, but only abnormal results are displayed) Labs Reviewed - No data to display  EKG None  Radiology  No results found.  Procedures Procedures (including critical care time)  Medications Ordered in ED Medications - No data to display   Initial Impression / Assessment and Plan / ED Course  I have reviewed the triage vital signs  and the nursing notes.  Pertinent labs & imaging results that were available during my care of the patient were reviewed by me and considered in my medical decision making (see chart for details).   Brief bedside ultrasound performed.  There does appear to be a small approximately 1 cm fluid collection noted.  I discussed doing incision and drainage with the patient.  She would prefer to try antibiotics initially.  Explained her is possible that this will not get better and she will need to return for I&D.  Patient understands  Final Clinical Impressions(s) / ED Diagnoses   Final diagnoses:  Abscess    ED Discharge Orders         Ordered    doxycycline (VIBRAMYCIN) 100 MG capsule  2 times daily     08/25/18 1211           Dorie Rank, MD 08/25/18 1214

## 2018-08-25 NOTE — Discharge Instructions (Addendum)
Take the antibiotics as prescribed.  Soak in warm tub and apply warm compresses to the area of tenderness and swelling.  Return to the emergency room for reevaluation if the symptoms are not improving in the next few days.

## 2018-08-25 NOTE — ED Notes (Signed)
Pt verbalized understanding of dc instructions.

## 2018-08-25 NOTE — ED Notes (Addendum)
Pt denies fever, denies drainage

## 2018-08-27 ENCOUNTER — Other Ambulatory Visit: Payer: Self-pay

## 2018-08-27 ENCOUNTER — Emergency Department (HOSPITAL_BASED_OUTPATIENT_CLINIC_OR_DEPARTMENT_OTHER)
Admission: EM | Admit: 2018-08-27 | Discharge: 2018-08-27 | Disposition: A | Discharge status: Home / Self Care | Payer: Managed Care, Other (non HMO) | Attending: Emergency Medicine | Admitting: Emergency Medicine

## 2018-08-27 ENCOUNTER — Encounter (HOSPITAL_BASED_OUTPATIENT_CLINIC_OR_DEPARTMENT_OTHER): Payer: Self-pay | Admitting: *Deleted

## 2018-08-27 DIAGNOSIS — Z87891 Personal history of nicotine dependence: Secondary | ICD-10-CM | POA: Insufficient documentation

## 2018-08-27 DIAGNOSIS — L0501 Pilonidal cyst with abscess: Secondary | ICD-10-CM | POA: Diagnosis not present

## 2018-08-27 DIAGNOSIS — L02412 Cutaneous abscess of left axilla: Secondary | ICD-10-CM | POA: Diagnosis not present

## 2018-08-27 MED ORDER — LIDOCAINE HCL (PF) 1 % IJ SOLN
10.0000 mL | Freq: Once | INTRAMUSCULAR | Status: AC
  Administered 2018-08-27: 10 mL
  Filled 2018-08-27: qty 10

## 2018-08-27 MED ORDER — NAPROXEN 500 MG PO TABS: 500.0000 mg | ORAL_TABLET | Freq: Two times a day (BID) | ORAL | 0 refills | Status: DC

## 2018-08-27 NOTE — Discharge Instructions (Addendum)
You were seen in the emergency department for a skin abscess- please see the attached handout for further information regarding this diagnoses. This buttock area was incised and drained to help release the bacteria. We would like you to apply warm compresses and warm flushes to this area 4-5 times per day to help facilitate further draining as needed. Please also apply warm compresses to the left arm pit that we did not drain as this may need to be drained as well. Continue your doxycycline.  We are also sending you home with naproxen to help with pain.   - Naproxen is a nonsteroidal anti-inflammatory medication that will help with pain and swelling. Be sure to take this medication as prescribed with food, 1 pill every 12 hours,  It should be taken with food, as it can cause stomach upset, and more seriously, stomach bleeding. Do not take other nonsteroidal anti-inflammatory medications with this such as Advil, Motrin, Aleve, Mobic, Goodie Powder, or Motrin.      You make take Tylenol per over the counter dosing with these medications.   We have prescribed you new medication(s) today. Discuss the medications prescribed today with your pharmacist as they can have adverse effects and interactions with your other medicines including over the counter and prescribed medications. Seek medical evaluation if you start to experience new or abnormal symptoms after taking one of these medicines, seek care immediately if you start to experience difficulty breathing, feeling of your throat closing, facial swelling, or rash as these could be indications of a more serious allergic reaction  We would like you to have this area rechecked within 48 hours- please return to the ER , go to an urgent care, or see your primary care provider for this. Return to the ER sooner for new or worsening symptoms including, but not limited to increased pain, spreading redness, fevers, inability to keep fluids down, or any other concerns  that you may have.

## 2018-08-27 NOTE — ED Triage Notes (Addendum)
Pt reports she has a "bump" under her arm and in the middle of her back for over 2 weeks. Pt was here 2 days ago for same thing but states they are worse. States she is taking doxy as prescribed

## 2018-08-27 NOTE — ED Provider Notes (Signed)
[facility] Provider Note   CSN: [id number] Arrival date & time: 08/27/18  1940    History   Chief Complaint Chief Complaint  Patient presents with  . Abscess    HPI Annette Greene is a 28 y.o. female with a hx of herpes, obesity, and ovarian cysts returns to the ER for worsening pain/swelling to the upper portion of gluteal cleft. Patient seen in the ER 2 days prior w/ complaints of pain & swelling to the upper gluteal cleft & the L axilla for 1-2 weeks. Bedside US showed small fluid collection to the gluteal cleft, she was offered I&D but declined at that time and elected for trial of abx. She has been taking doxycycline as prescribed. Area to the upper gluteal cleft has seemed to worse in size & discomfort, L axillary area is fairly similar maybe slightly worse. Worse w/ palpation, no alleviating factors. No drainage from the areas. Denies fever, chills, nausea, or vomiting. She has a hx of similar to the L axilla which typically drain spontaneously, no prior hx of similar to the gluteal cleft.     HPI  Past Medical History:  Diagnosis Date  . Herpes   . Hx of chlamydia infection   . Infection    UTI  . Preterm labor     Patient Active Problem List   Diagnosis Date Noted  . SVD (spontaneous vaginal delivery) 07/21/2018  . Indication for care in labor or delivery 07/20/2018  . Human papilloma virus infection 06/02/2018  . Irregular periods 06/02/2018  . Abdominal pain affecting pregnancy 02/06/2018  . Obesity, morbid ([place]) 12/27/2017  . History of herpes simplex type 2 infection 12/27/2017  . Supervision of high risk pregnancy, antepartum 12/27/2017  . Premature delivery 12/10/2016  . Incompetent cervix in pregnancy, antepartum 11/24/2016  . Cyst of ovary 08/17/2016    Past Surgical History:  Procedure Laterality Date  . CERVICAL CERCLAGE N/A 01/25/2018   Procedure: CERCLAGE CERVICAL;  Surgeon: Everett Graff, MD;  Location: [facility];  Service: Gynecology;  Laterality: N/A;  . TONSILLECTOMY AND ADENOIDECTOMY    . WISDOM TOOTH EXTRACTION     all 4 teeth     OB History    Gravida  2   Para  2   Term  1   Preterm  1   AB      Living  2     SAB      TAB      Ectopic      Multiple  0   Live Births  2            Home Medications    Prior to Admission medications   Medication Sig Start Date End Date Taking? Authorizing Provider  doxycycline (VIBRAMYCIN) 100 MG capsule Take 1 capsule (100 mg total) by mouth 2 (two) times daily. 08/25/18  Yes Dorie Rank, MD  Prenatal Vit-Fe Fumarate-FA (MULTIVITAMIN-PRENATAL) 27-0.8 MG TABS tablet Take 1 tablet by mouth daily.   Yes [provider]  acetaminophen (TYLENOL) 500 MG tablet Take 500 mg by mouth every 6 (six) hours as needed for headache.    [provider]  cyclobenzaprine (FLEXERIL) 5 MG tablet Take 1 tablet (5 mg total) by mouth 3 (three) times daily as needed for muscle spasms. Patient not taking: Reported on 07/24/2018 07/22/18   Nicolette Bang, DO  ibuprofen (ADVIL) 800 MG tablet Take 1 tablet (800 mg total) by mouth every 8 (eight) hours as  needed. Patient not taking: Reported on 07/24/2018 07/22/18   Nicolette Bang, DO  ondansetron (ZOFRAN ODT) 4 MG disintegrating tablet Take 1 tablet (4 mg total) by mouth every 8 (eight) hours as needed for nausea or vomiting. Patient not taking: Reported on 07/24/2018 04/20/18   Julianne Handler, CNM  senna-docusate (SENOKOT-S) 8.6-50 MG tablet Take 2 tablets by mouth daily. Patient not taking: Reported on 07/24/2018 07/23/18   Nicolette Bang, DO    Family History Family History  Problem Relation Age of Onset  . Healthy Mother   . Hypertension Mother   . Healthy Father   . Healthy Sister   . Healthy Maternal Grandmother   . Hypertension Maternal Grandmother   . Healthy Paternal Grandmother   . Healthy Paternal Grandfather   . Healthy Sister   . Drug abuse  Maternal Aunt   . Hypertension Maternal Aunt     Social History Social History   Tobacco Use  . Smoking status: Former Smoker    Packs/day: 0.50    Types: Cigarettes    Last attempt to quit: 06/24/2016    Years since quitting: 2.1  . Smokeless tobacco: Never Used  . Tobacco comment: stopped since pregnant  Substance Use Topics  . Alcohol use: Not Currently    Comment: stopped with pregnancy  . Drug use: Not Currently     Allergies   Patient has no known allergies.   Review of Systems Review of Systems  Constitutional: Negative for chills and fever.  Gastrointestinal: Negative for nausea and vomiting.  Skin: Positive for color change.       + for painful areas of swelling to upper gluteal cleft & L axilla  Neurological: Negative for weakness and numbness.     Physical Exam Updated Vital Signs BP (!) 111/57 (BP Location: Left Arm)   Pulse 89   Temp 98.8 F (37.1 C) (Oral)   Resp 18   Ht 5\' 7"  (1.702 m)   Wt 123.8 kg   SpO2 98%   Breastfeeding Yes   BMI 42.76 kg/m   Physical Exam Vitals signs and nursing note reviewed.  Constitutional:      General: She is not in acute distress.    Appearance: She is well-developed. She is not toxic-appearing.  HENT:     Head: Normocephalic and atraumatic.  Eyes:     General:        Right eye: No discharge.        Left eye: No discharge.     Conjunctiva/sclera: Conjunctivae normal.  Neck:     Musculoskeletal: Neck supple.  Cardiovascular:     Rate and Rhythm: Normal rate and regular rhythm.     Pulses:          Radial pulses are 2+ on the right side and 2+ on the left side.  Pulmonary:     Effort: Pulmonary effort is normal. No respiratory distress.     Breath sounds: Normal breath sounds. No wheezing, rhonchi or rales.  Abdominal:     General: There is no distension.     Palpations: Abdomen is soft.     Tenderness: There is no abdominal tenderness.  Musculoskeletal:     Comments: UEs: Intact AROM. No point/focal  bony tenderness.   Skin:    General: Skin is warm and dry.          Comments: L axilla: 2.5 cm area of swelling w/ fluctuance. Area is tender to palpation. No significant overlying erythema/warmth. Mild surrounding  induration.   Neurological:     General: No focal deficit present.     Mental Status: She is alert.     Comments: Clear speech.   Psychiatric:        Behavior: Behavior normal.      ED Treatments / Results  Labs (all labs ordered are listed, but only abnormal results are displayed) Labs Reviewed - No data to display  EKG None  Radiology No results found.  Procedures .Marland KitchenIncision and Drainage Date/Time: 08/27/2018 9:33 PM Performed by: Amaryllis Dyke, PA-C Authorized by: Amaryllis Dyke, PA-C   Consent:    Consent obtained:  Verbal   Consent given by:  Patient   Risks discussed:  Bleeding, damage to other organs, infection, pain and incomplete drainage   Alternatives discussed:  Alternative treatment and no treatment Location:    Type:  Abscess   Location:  Anogenital   Anogenital location:  Pilonidal Pre-procedure details:    Skin preparation:  Betadine Anesthesia (see MAR for exact dosages):    Anesthesia method:  Local infiltration   Local anesthetic:  Lidocaine 1% w/o epi Procedure type:    Complexity:  Simple Procedure details:    Incision types:  Stab incision   Scalpel blade:  11   Wound management:  Probed and deloculated, irrigated with saline and extensive cleaning (Suction utilized to ensure evacuation of pus)   Drainage:  Bloody and purulent   Drainage amount:  Copious   Wound treatment:  Wound left open   Packing materials:  1/4 in iodoform gauze Post-procedure details:    Patient tolerance of procedure:  Tolerated well, no immediate complications   (including critical care time)  Medications Ordered in ED Medications - No data to display   Initial Impression / Assessment and Plan / ED Course  I have reviewed the  triage vital signs and the nursing notes.  Pertinent labs & imaging results that were available during my care of the patient were reviewed by me and considered in my medical decision making (see chart for details).    Patient returns to the ED for increased pain/swelling to the pilonidal area, some mild worsening sxs to L axilla as well, currently on doxycycline. Exam & bedside US confirm abscess in each location. Ultimately patient elected for I&D of pilonidal abscess, but refused I&D to L axillary abscess despite my recommendation as she has had abscesses in this location before that she has managed at home, she understands she ultimately may need to return for I&D of the L axilla as well. Dr. Lita Mains evaluated pilonidal area w/ [place] as well prior to procedure to help ensure proper procedure site. Procedure per note above, packing placed will need to be removed within 48 hours. Continue doxycycline. Naproxen for pain. Recommended application of warm compresses/soaks/flushing. Will have patient return for wound recheck in 2 days. I discussed treatment plan, need for follow-up, and return precautions with the patient. Provided opportunity for questions, patient confirmed understanding and is in agreement with plan.   Final Clinical Impressions(s) / ED Diagnoses   Final diagnoses:  Pilonidal abscess    ED Discharge Orders         Ordered    naproxen (NAPROSYN) 500 MG tablet  2 times daily     08/27/18 2124           Amaryllis Dyke, PA-C 08/27/18 2139    Julianne Rice, MD 08/27/18 2235

## 2018-09-07 ENCOUNTER — Other Ambulatory Visit: Payer: Self-pay

## 2018-09-07 ENCOUNTER — Telehealth (INDEPENDENT_AMBULATORY_CARE_PROVIDER_SITE_OTHER): Payer: Medicaid Other | Admitting: Obstetrics and Gynecology

## 2018-09-07 NOTE — Progress Notes (Signed)
I connected with  Annette Greene on 09/07/18 at 10:35 AM EDT by telephone and verified that I am speaking with the correct person using two identifiers.   I discussed the limitations, risks, security and privacy concerns of performing an evaluation and management service by telephone and the availability of in person appointments. I also discussed with the patient that there may be a patient responsible charge related to this service. The patient expressed understanding and agreed to proceed.  Aviva Signs Tazaria Dlugosz, CMA 09/07/2018  10:37 AM    SVD Both breast & Bottle Enfamil Neo Pro Staining  Bladder and Bowel Ok Nexplanon Not Sexually Active yet Flavia Shipper *Normal

## 2018-09-07 NOTE — Progress Notes (Signed)
TELEHEALTH VIRTUAL POSTPARTUM VISIT ENCOUNTER NOTE  I connected with@ on 09/07/18 at 10:35 AM EDT by telephone at home and verified that I am speaking with the correct person using two identifiers.   I discussed the limitations, risks, security and privacy concerns of performing an evaluation and management service by telephone and the availability of in person appointments. I also discussed with the patient that there may be a patient responsible charge related to this service. The patient expressed understanding and agreed to proceed.  Appointment Date: 09/07/2018  [facility]N [facility]: [facility]   Chief Complaint:  Chief Complaint  Patient presents with  . Postpartum Care    History of Present Illness: TIOMBE TOMEO is a 28 y.o. African-American [id number] (No LMP recorded.), seen for the above chief complaint. Her past medical history is significant for Incompetent cervix, herpes, HPV.   She is s/p vaginal delivery on 38  weeks; she was discharged to home on 07/22/2018.  Pregnancy complicated by . Baby is doing well.   Complains of vaginal bleeding that waxes and wanes. Occasional dizziness.   Vaginal bleeding or discharge: Yes  Mode of feeding infant: Bottle and Breast Intercourse: No  Contraception: Desires Nexplanon PP depression s/s: No .  Any bowel or bladder issues: No  Pap smear: no abnormalities (date: 2018)  Review of Systems: Positive for vaginal bleeding. Her 12 point review of systems is negative or as noted in the History of Present Illness.  Patient Active Problem List   Diagnosis Date Noted  . SVD (spontaneous vaginal delivery) 07/21/2018  . Indication for care in labor or delivery 07/20/2018  . Human papilloma virus infection 06/02/2018  . Irregular periods 06/02/2018  . Abdominal pain affecting pregnancy 02/06/2018  . Obesity, morbid ([place]) 12/27/2017  . History of herpes simplex type 2 infection 12/27/2017  . Supervision of high risk pregnancy, antepartum 12/27/2017   . Premature delivery 12/10/2016  . Incompetent cervix in pregnancy, antepartum 11/24/2016  . Cyst of ovary 08/17/2016    Medications Vikki N. Criger had no medications administered during this visit. Current Outpatient Medications  Medication Sig Dispense Refill  . acetaminophen (TYLENOL) 500 MG tablet Take 500 mg by mouth every 6 (six) hours as needed for headache.    . cyclobenzaprine (FLEXERIL) 5 MG tablet Take 1 tablet (5 mg total) by mouth 3 (three) times daily as needed for muscle spasms. (Patient not taking: Reported on 07/24/2018) 30 tablet 0  . doxycycline (VIBRAMYCIN) 100 MG capsule Take 1 capsule (100 mg total) by mouth 2 (two) times daily. 20 capsule 0  . ibuprofen (ADVIL) 800 MG tablet Take 1 tablet (800 mg total) by mouth every 8 (eight) hours as needed. (Patient not taking: Reported on 07/24/2018) 60 tablet 1  . naproxen (NAPROSYN) 500 MG tablet Take 1 tablet (500 mg total) by mouth 2 (two) times daily. 10 tablet 0  . ondansetron (ZOFRAN ODT) 4 MG disintegrating tablet Take 1 tablet (4 mg total) by mouth every 8 (eight) hours as needed for nausea or vomiting. (Patient not taking: Reported on 07/24/2018) 20 tablet 0  . Prenatal Vit-Fe Fumarate-FA (MULTIVITAMIN-PRENATAL) 27-0.8 MG TABS tablet Take 1 tablet by mouth daily.    Marland Kitchen senna-docusate (SENOKOT-S) 8.6-50 MG tablet Take 2 tablets by mouth daily. (Patient not taking: Reported on 07/24/2018) 20 tablet 0   No current facility-administered medications for this visit.     Allergies Patient has no known allergies.  Physical Exam:  General:  Alert, oriented and cooperative.  Mental Status: Normal mood and affect perceived. Normal judgment and thought content.  Rest of physical exam deferred due to type of encounter  PP Depression Screening:   Edinburgh Postnatal Depression Scale - 09/07/18 1042      Edinburgh Postnatal Depression Scale:  In the Past 7 Days   I have been able to laugh and see the funny side of things.  0    I  have looked forward with enjoyment to things.  0    I have blamed myself unnecessarily when things went wrong.  0    I have been anxious or worried for no good reason.  0    I have felt scared or panicky for no good reason.  0    Things have been getting on top of me.  0    I have been so unhappy that I have had difficulty sleeping.  0    I have felt sad or miserable.  0    I have been so unhappy that I have been crying.  0    The thought of harming myself has occurred to me.  0    Edinburgh Postnatal Depression Scale Total  0       Assessment:Patient is a 28 y.o. [id number] who is 5 weeks postpartum from a vaginal delivery.  She is doing well.   Plan:  Patient desires nexplanon, will have office call to scheduled If bleeding continues past 6-8 weeks she will need office visit.  Continue prenatal vitamins, add daily iron supplement.    RTC for nexplanon insertion.   I discussed the assessment and treatment plan with the patient. The patient was provided an opportunity to ask questions and all were answered. The patient agreed with the plan and demonstrated an understanding of the instructions.   The patient was advised to call back or seek an in-person evaluation/go to the ED for any concerning postpartum symptoms.  I provided 11 minutes of non-face-to-face time during this encounter.   Noni Saupe, NP [facility], [place]

## 2018-09-12 ENCOUNTER — Ambulatory Visit: Payer: Medicaid Other | Admitting: Medical

## 2018-10-02 IMAGING — US US MFM OB TRANSVAGINAL
1 series · 13 of 28 positions shown · non-contrast
Comparison: none

[Series 1: us mfm ob transvaginal · 13 of 65 slices shown]
[im 3/65]
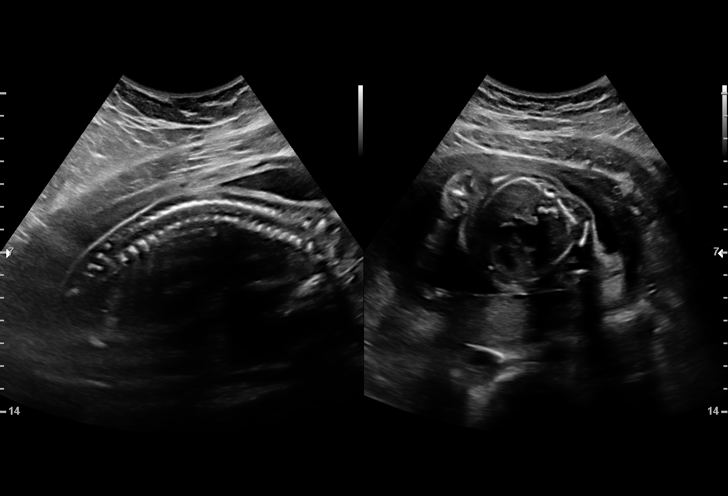
[im 8/65]
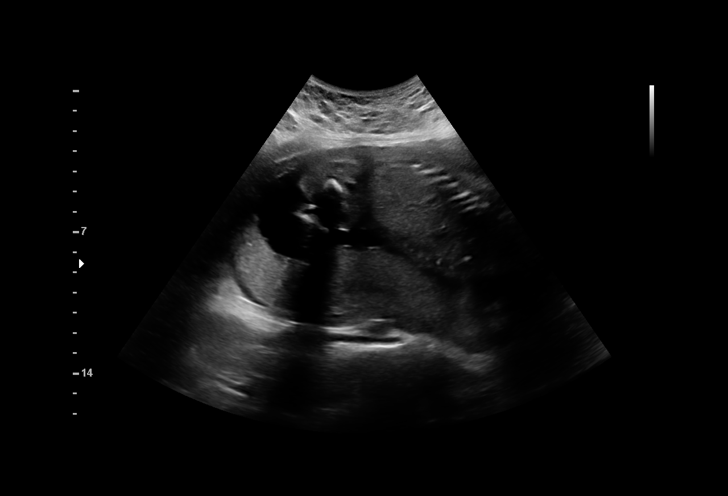
[im 12/65]
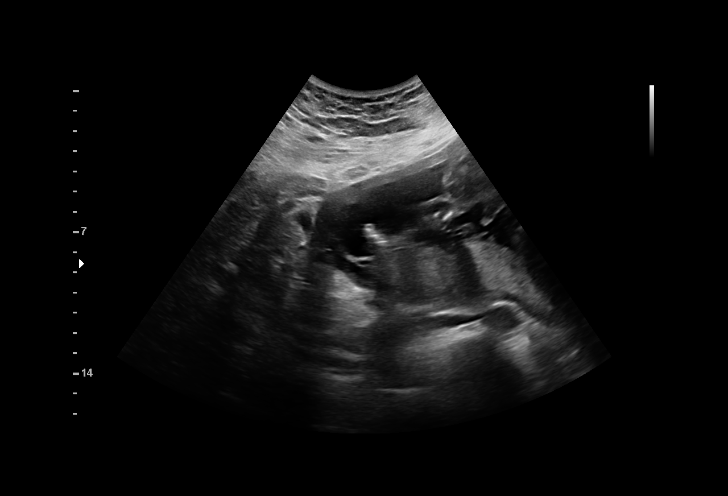
[im 17/65]
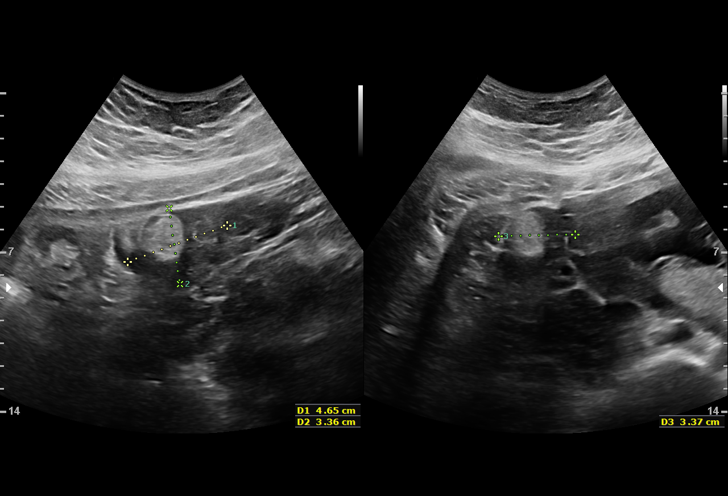
[im 22/65]
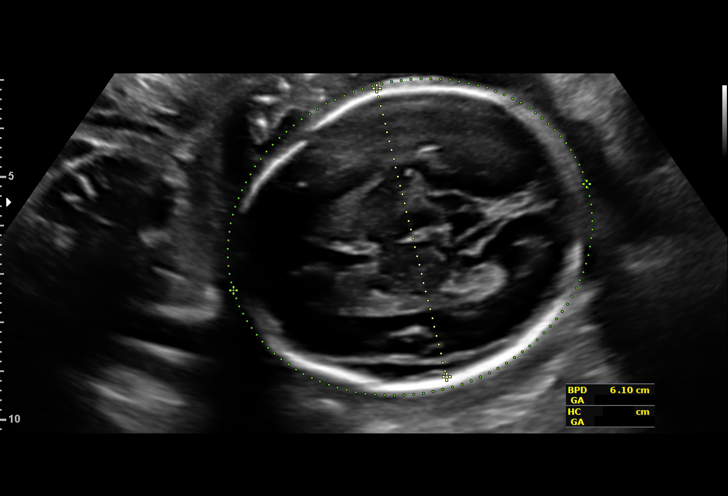
[im 27/65]
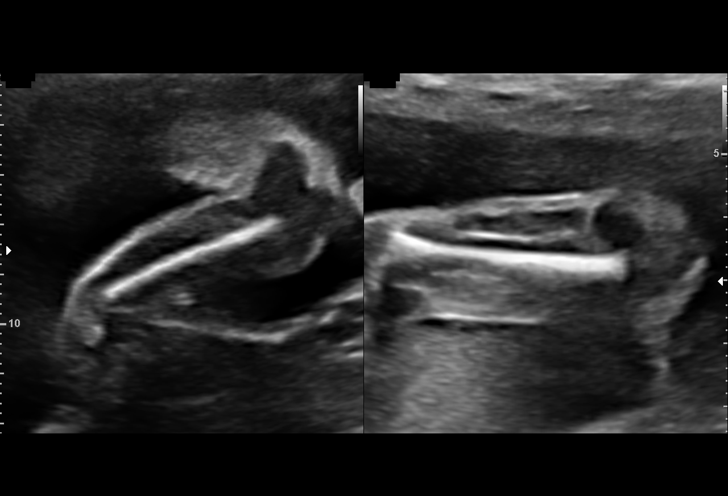
[im 34/65]
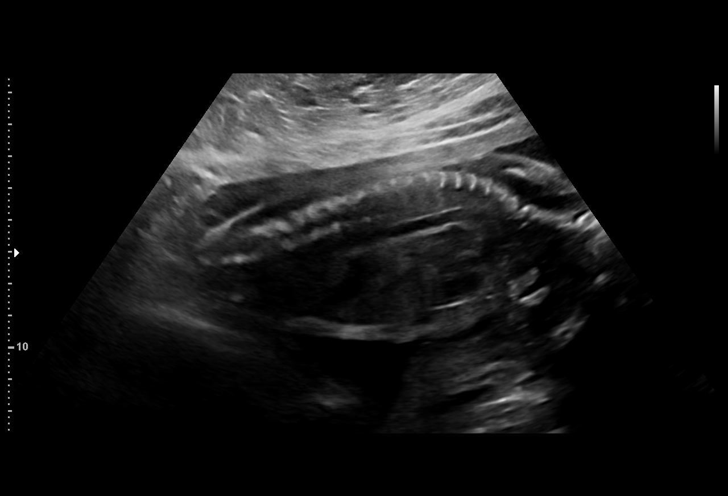
[im 38/65]
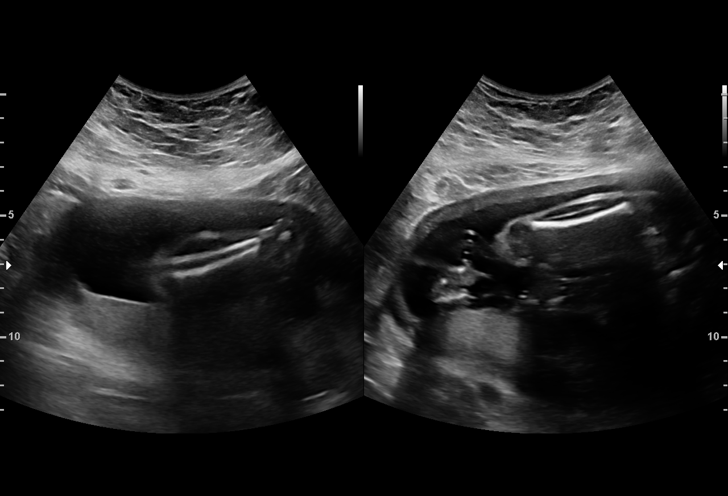
[im 43/65]
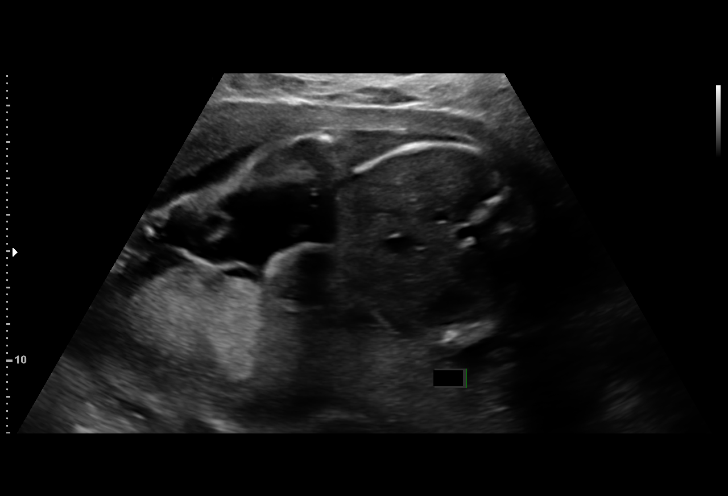
[im 48/65]
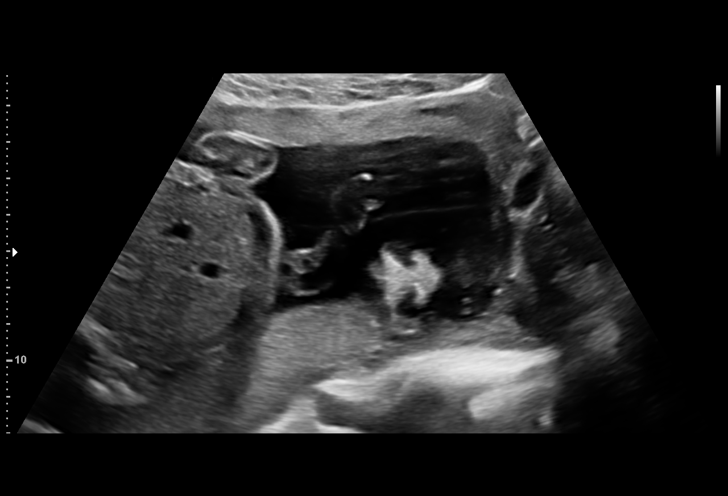
[im 53/65]
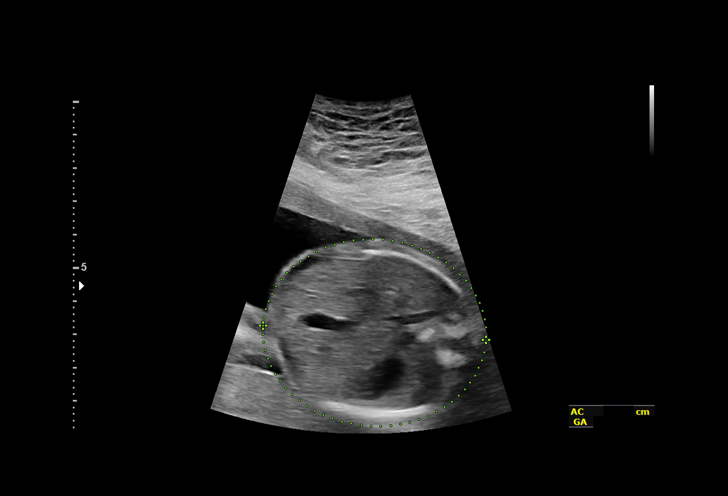
[im 57/65]
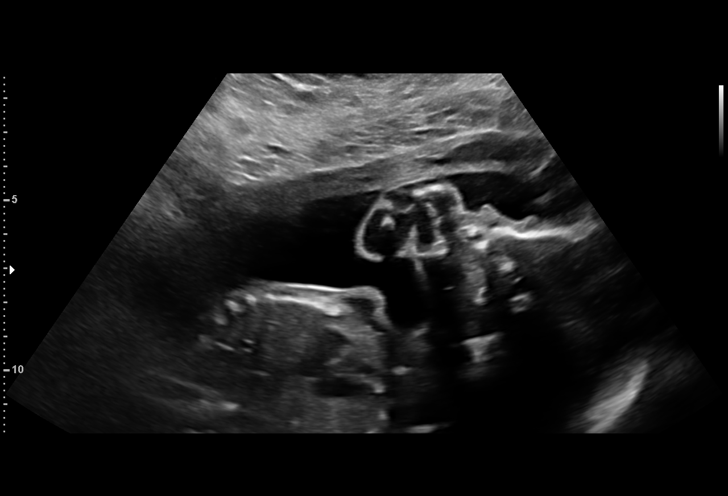
[im 62/65]
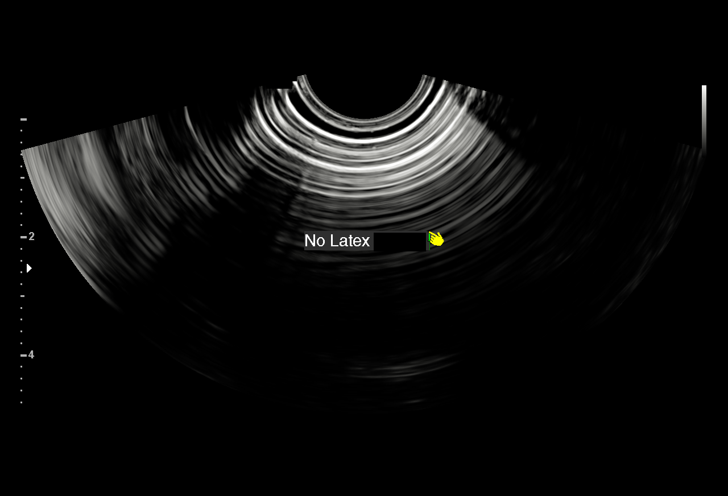

[13 of 28 positions shown; findings below may reference images not displayed]

Obstetrics &
Gynecology
[address]
[place].
MAU/Triage

1  SHERINE ALMANZAR            [id number]      [id number]     [id number]
2  SHERINE ALMANZAR            [id number]      [id number]     [id number]
Indications

24 weeks gestation of pregnancy
Encounter for fetal anatomic survey
Obesity complicating pregnancy, second
trimester
Cervical insufficiency, 2nd
OB History

Gravidity:    1         Term:   0        Prem:   0        SAB:   0
TOP:          0       Ectopic:  0        Living: 0
Fetal Evaluation

Num Of Fetuses:     1
Fetal Heart         168
Rate(bpm):
Cardiac Activity:   Observed
Presentation:       Cephalic
Placenta:           Posterior, above cervical os
P. Cord Insertion:  Not well visualized

Amniotic Fluid
AFI FV:      Subjectively within normal limits

Largest Pocket(cm)
5.26
Biometry

BPD:      60.4  mm     G. Age:  24w 4d         55  %    CI:        75.22   %   70 - 86
FL/HC:      19.0   %   18.7 -
HC:      220.9  mm     G. Age:  24w 1d         26  %    HC/AC:      1.14       1.05 -
AC:      194.3  mm     G. Age:  24w 1d         37  %    FL/BPD:     69.5   %   71 - 87
FL:         42  mm     G. Age:  23w 5d         20  %    FL/AC:      21.6   %   20 - 24
HUM:        38  mm     G. Age:  23w 3d         21  %

Est. FW:     649  gm      1 lb 7 oz     46  %
Gestational Age

LMP:           24w 2d       Date:   06/07/16                 EDD:   03/14/17
U/S Today:     24w 1d                                        EDD:   03/15/17
Best:          24w 2d    Det. By:   LMP  (06/07/16)          EDD:   03/14/17
Anatomy

Cranium:               Appears normal         Aortic Arch:            Appears normal
Cavum:                 Appears normal         Ductal Arch:            Appears normal
Ventricles:            Appears normal         Diaphragm:              Appears normal
Choroid Plexus:        Appears normal         Stomach:                Appears normal, left
sided
Cerebellum:            Appears normal         Abdomen:                Appears normal
Posterior Fossa:       Appears normal         Abdominal Wall:         Appears nml (cord
insert, abd wall)
Nuchal Fold:           Not applicable (>20    Cord Vessels:           Appears normal (3
wks GA)                                        vessel cord)
Face:                  Orbits nl; profile not Kidneys:                Appear normal
well visualized
Lips:                  Appears normal         Bladder:                Appears normal
Thoracic:              Appears normal         Spine:                  Appears normal
Heart:                 Appears normal         Upper Extremities:      Appears normal
(4CH, axis, and
situs)
RVOT:                  Not well visualized    Lower Extremities:      Appears normal
LVOT:                  Appears normal

Other:  Fetus appears to be a male. Heels visualized. Nasal bone visualized.
Technically difficult due to maternal habitus and fetal position.
Cervix Uterus Adnexa

Cervix
Dilated

Uterus
No abnormality visualized.

Left Ovary
Dermoid noted measuring 1.5 x 1.1 x 1.1 cm.

Right Ovary
Dermoid noted measuring 2.3 x 2.1 x 1.9 cm.

Cul De Sac:   No free fluid seen.

Adnexa:       No abnormality visualized.
Impression

Singleton intrauterine pregnancy at 24+2 with suspected
cervical change
Review of the anatomy shows no sonographic markers for
aneuploidy or structural anomalies
However, cardiac and facial evaluations should be
considered suboptimal secondary to maternal body habitus
and fetal position
Amniotic fluid volume is normal
Estimated fetal weight is 649g which is growth in the 46th
percentile
The cervix is open with funneling of the membranes. no
closed cervix is seen. Internal os is dilated 1.8 cm
Recommendations

Conitnue clinical evaluation and managment

## 2018-10-08 ENCOUNTER — Other Ambulatory Visit: Payer: Self-pay

## 2018-10-08 ENCOUNTER — Emergency Department (HOSPITAL_COMMUNITY)
Admission: EM | Admit: 2018-10-08 | Discharge: 2018-10-08 | Disposition: A | Discharge status: Home / Self Care | Payer: Medicaid Other | Attending: Emergency Medicine | Admitting: Emergency Medicine

## 2018-10-08 ENCOUNTER — Encounter (HOSPITAL_COMMUNITY): Payer: Self-pay | Admitting: *Deleted

## 2018-10-08 DIAGNOSIS — Z87891 Personal history of nicotine dependence: Secondary | ICD-10-CM | POA: Diagnosis not present

## 2018-10-08 DIAGNOSIS — R109 Unspecified abdominal pain: Secondary | ICD-10-CM | POA: Diagnosis present

## 2018-10-08 DIAGNOSIS — K8500 Idiopathic acute pancreatitis without necrosis or infection: Secondary | ICD-10-CM | POA: Diagnosis not present

## 2018-10-08 DIAGNOSIS — R0789 Other chest pain: Secondary | ICD-10-CM | POA: Insufficient documentation

## 2018-10-08 DIAGNOSIS — R0602 Shortness of breath: Secondary | ICD-10-CM | POA: Diagnosis not present

## 2018-10-08 DIAGNOSIS — I4510 Unspecified right bundle-branch block: Secondary | ICD-10-CM | POA: Diagnosis not present

## 2018-10-08 DIAGNOSIS — R1110 Vomiting, unspecified: Secondary | ICD-10-CM | POA: Insufficient documentation

## 2018-10-08 LAB — CBC
HCT: 37.5 % (ref 36.0–46.0)
Hemoglobin: 11.6 g/dL — ABNORMAL LOW (ref 12.0–15.0)
MCH: 25.8 pg — ABNORMAL LOW (ref 26.0–34.0)
MCHC: 30.9 g/dL (ref 30.0–36.0)
MCV: 83.3 fL (ref 80.0–100.0)
Platelets: 301 10*3/uL (ref 150–400)
RBC: 4.5 MIL/uL (ref 3.87–5.11)
RDW: 16.7 % — ABNORMAL HIGH (ref 11.5–15.5)
WBC: 7.3 10*3/uL (ref 4.0–10.5)
nRBC: 0 % (ref 0.0–0.2)

## 2018-10-08 LAB — COMPREHENSIVE METABOLIC PANEL
ALT: 22 U/L (ref 0–44)
AST: 29 U/L (ref 15–41)
Albumin: 3.8 g/dL (ref 3.5–5.0)
Alkaline Phosphatase: 53 U/L (ref 38–126)
Anion gap: 8 (ref 5–15)
BUN: 13 mg/dL (ref 6–20)
CO2: 29 mmol/L (ref 22–32)
Calcium: 9.1 mg/dL (ref 8.9–10.3)
Chloride: 102 mmol/L (ref 98–111)
Creatinine, Ser: 0.84 mg/dL (ref 0.44–1.00)
GFR calc Af Amer: >60 mL/min (ref >60)
GFR calc non Af Amer: >60 mL/min (ref >60)
Glucose, Bld: 94 mg/dL (ref 70–99)
Potassium: 3.8 mmol/L (ref 3.5–5.1)
Sodium: 139 mmol/L (ref 135–145)
Total Bilirubin: 0.3 mg/dL (ref 0.3–1.2)
Total Protein: 7.3 g/dL (ref 6.5–8.1)

## 2018-10-08 LAB — URINALYSIS, ROUTINE W REFLEX MICROSCOPIC
Bilirubin Urine: NEGATIVE
Glucose, UA: NEGATIVE mg/dL
Hgb urine dipstick: NEGATIVE
Ketones, ur: NEGATIVE mg/dL
Leukocytes,Ua: NEGATIVE
Nitrite: NEGATIVE
Protein, ur: NEGATIVE mg/dL
Specific Gravity, Urine: 1.028 (ref 1.005–1.030)
pH: 7 (ref 5.0–8.0)

## 2018-10-08 LAB — I-STAT BETA HCG BLOOD, ED (MC, WL, AP ONLY): I-stat hCG, quantitative: <5 m[IU]/mL (ref <5)

## 2018-10-08 LAB — LIPASE, BLOOD: Lipase: 1274 U/L — ABNORMAL HIGH (ref 11–51)

## 2018-10-08 MED ORDER — SODIUM CHLORIDE 0.9% FLUSH
3.0000 mL | Freq: Once | INTRAVENOUS | Status: AC
  Administered 2018-10-08: 3 mL via INTRAVENOUS

## 2018-10-08 MED ORDER — HYDROCODONE-ACETAMINOPHEN 5-325 MG PO TABS: 1.0000 | ORAL_TABLET | Freq: Four times a day (QID) | ORAL | 0 refills | Status: DC | PRN

## 2018-10-08 NOTE — ED Triage Notes (Signed)
Pt woke up this morning with chest pain, sob, and emesis x1. Reports chest pain and sob have resolved, c/o abd cramping with nausea.

## 2018-10-08 NOTE — ED Provider Notes (Signed)
[facility] EMERGENCY DEPARTMENT Provider Note   CSN: [id number] Arrival date & time: 10/08/18  0247     History   Chief Complaint Chief Complaint  Patient presents with   Abdominal Cramping   Shortness of Breath    HPI Annette Greene is a 28 y.o. female.     The history is provided by the patient.  Abdominal Cramping This is a new problem. The current episode started more than 2 days ago. The problem occurs daily. The problem has been resolved. Associated symptoms include chest pain, abdominal pain and shortness of breath. Nothing aggravates the symptoms. Nothing relieves the symptoms. She has tried nothing for the symptoms.  Shortness of Breath Associated symptoms: abdominal pain, chest pain and vomiting   Associated symptoms: no fever   Patient presents for abdominal pain.  She reports for the past week she has had abdominal cramping and back pain.  She also reports dry heaving.  She reports tonight she woke up with worsening abdominal pain that went to her chest and she felt short of breath.  She also has been vomiting nonbloody emesis.  No fevers.  No lower abdominal pain, no vaginal bleeding.  No dysuria.  Since she was in the ER all of her pain has resolved.  She denies alcohol abuse.  No recent medications.  No history of any gallbladder disease.  She is otherwise healthy  Past Medical History:  Diagnosis Date   Herpes    Hx of chlamydia infection    Infection    UTI   Preterm labor     Patient Active Problem List   Diagnosis Date Noted   Postpartum exam 09/07/2018   SVD (spontaneous vaginal delivery) 07/21/2018   Indication for care in labor or delivery 07/20/2018   Human papilloma virus infection 06/02/2018   Irregular periods 06/02/2018   Abdominal pain affecting pregnancy 02/06/2018   Obesity, morbid ([place]) 12/27/2017   History of herpes simplex type 2 infection 12/27/2017   Supervision of high risk pregnancy, antepartum  12/27/2017   Premature delivery 12/10/2016   Incompetent cervix in pregnancy, antepartum 11/24/2016   Cyst of ovary 08/17/2016    Past Surgical History:  Procedure Laterality Date   CERVICAL CERCLAGE N/A 01/25/2018   Procedure: CERCLAGE CERVICAL;  Surgeon: Everett Graff, MD;  Location: [facility];  Service: Gynecology;  Laterality: N/A;   TONSILLECTOMY AND ADENOIDECTOMY     WISDOM TOOTH EXTRACTION     all 4 teeth     OB History    Gravida  2   Para  2   Term  1   Preterm  1   AB      Living  2     SAB      TAB      Ectopic      Multiple  0   Live Births  2            Home Medications    Prior to Admission medications   Medication Sig Start Date End Date Taking? Authorizing Provider  acetaminophen (TYLENOL) 500 MG tablet Take 500 mg by mouth every 6 (six) hours as needed for headache.    [provider]  HYDROcodone-acetaminophen (NORCO/VICODIN) 5-325 MG tablet Take 1 tablet by mouth every 6 (six) hours as needed. 10/08/18   Ripley Fraise, MD  naproxen (NAPROSYN) 500 MG tablet Take 1 tablet (500 mg total) by mouth 2 (two) times daily. 08/27/18   Petrucelli, Glynda Jaeger, PA-C  Prenatal Vit-Fe Fumarate-FA (MULTIVITAMIN-PRENATAL) 27-0.8 MG TABS tablet Take 1 tablet by mouth daily.    [provider]  senna-docusate (SENOKOT-S) 8.6-50 MG tablet Take 2 tablets by mouth daily. Patient not taking: Reported on 07/24/2018 07/23/18   Nicolette Bang, DO    Family History Family History  Problem Relation Age of Onset   Healthy Mother    Hypertension Mother    Healthy Father    Healthy Sister    Healthy Maternal Grandmother    Hypertension Maternal Grandmother    Healthy Paternal Grandmother    Healthy Paternal Grandfather    Healthy Sister    Drug abuse Maternal Aunt    Hypertension Maternal Aunt     Social History Social History   Tobacco Use   Smoking status: Former Smoker    Packs/day: 0.50     Types: Cigarettes    Quit date: 06/24/2016    Years since quitting: 2.2   Smokeless tobacco: Never Used   Tobacco comment: stopped since pregnant  Substance Use Topics   Alcohol use: Not Currently    Comment: stopped with pregnancy   Drug use: Not Currently     Allergies   Patient has no known allergies.   Review of Systems Review of Systems  Constitutional: Negative for fever.  Respiratory: Positive for shortness of breath.   Cardiovascular: Positive for chest pain.  Gastrointestinal: Positive for abdominal pain and vomiting. Negative for blood in stool, constipation and diarrhea.  Genitourinary: Negative for vaginal bleeding.  All other systems reviewed and are negative.    Physical Exam Updated Vital Signs BP 119/71    Pulse 67    Resp 18    LMP 09/24/2018    SpO2 100%   Physical Exam CONSTITUTIONAL: Well developed/well nourished HEAD: Normocephalic/atraumatic EYES: EOMI/PERRL ENMT: Mucous membranes moist NECK: supple no meningeal signs SPINE/BACK:entire spine nontender CV: S1/S2 noted, no murmurs/rubs/gallops noted LUNGS: Lungs are clear to auscultation bilaterally, no apparent distress ABDOMEN: soft, nontender, no rebound or guarding, bowel sounds noted throughout abdomen GU:no cva tenderness NEURO: Pt is awake/alert/appropriate, moves all extremitiesx4.  No facial droop.   EXTREMITIES: pulses normal/equal, full ROM SKIN: warm, color normal PSYCH: no abnormalities of mood noted, alert and oriented to situation   ED Treatments / Results  Labs (all labs ordered are listed, but only abnormal results are displayed) Labs Reviewed  LIPASE, BLOOD - Abnormal; Notable for the following components:      Result Value   Lipase 1,274 (*)    All other components within normal limits  CBC - Abnormal; Notable for the following components:   Hemoglobin 11.6 (*)    MCH 25.8 (*)    RDW 16.7 (*)    All other components within normal limits  COMPREHENSIVE METABOLIC  PANEL  URINALYSIS, ROUTINE W REFLEX MICROSCOPIC  I-STAT BETA HCG BLOOD, ED ([facility], [facility], [facility] ONLY)    EKG EKG Interpretation  Date/Time:  Sunday October 08 2018 02:56:48 EDT Ventricular Rate:  69 PR Interval:  186 QRS Duration: 98 QT Interval:  400 QTC Calculation: 428 R Axis:   3 Text Interpretation:  Normal sinus rhythm Incomplete right bundle branch block Borderline ECG Confirmed by Ripley Fraise [phone]) on 10/08/2018 3:09:46 AM   Radiology No results found.  Procedures Procedures   Medications Ordered in ED Medications  sodium chloride flush (NS) 0.9 % injection 3 mL (3 mLs Intravenous Given 10/08/18 0607)     Initial Impression / Assessment and Plan / ED Course  I have  reviewed the triage vital signs and the nursing notes.  Pertinent labs  results that were available during my care of the patient were reviewed by me and considered in my medical decision making (see chart for details).        She presented with abdominal pain for the past week abruptly worsened tonight it also triggered chest pain shortness of breath.  She is now chest pain/abdominal pain-free.  No pleuritic pain, tachycardia or hypoxia.  Low suspicion for ACS/PE.   She appears to have pancreatitis of unclear etiology. She denies alcohol abuse She is well-appearing.  She declines further testing the ER.  She will be referred to a gastroenterologist. She does request pain medications, advised if she is breast-feeding she would need to pump/dump the milk when taking pain meds We discussed diet changes Discussed strict ER return precautions  Final Clinical Impressions(s) / ED Diagnoses   Final diagnoses:  Idiopathic acute pancreatitis without infection or necrosis    ED Discharge Orders         Ordered    HYDROcodone-acetaminophen (NORCO/VICODIN) 5-325 MG tablet  Every 6 hours PRN     10/08/18 0631           Ripley Fraise, MD 10/08/18 [phone]

## 2018-10-15 IMAGING — US US MFM OB LIMITED
1 series · 15 of 16 positions shown · non-contrast
Comparison: none

[Series 1: us mfm ob limited · 16 acquisitions, 15 frames shown]
[im 1/16]
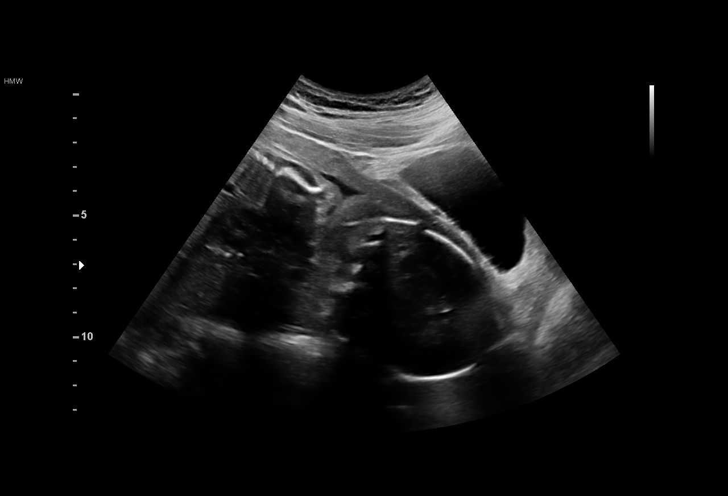
[im 2/16]
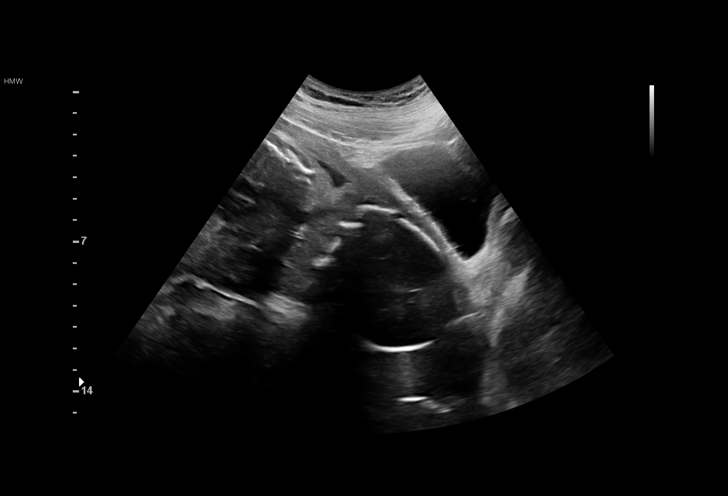
[im 3/16]
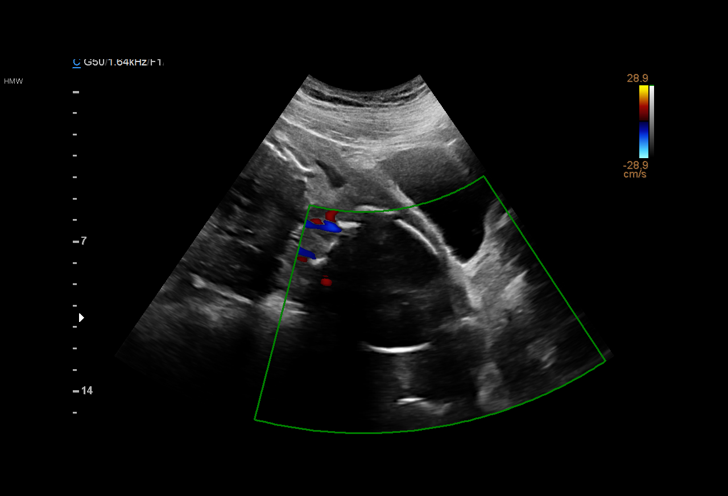
[im 4/16]
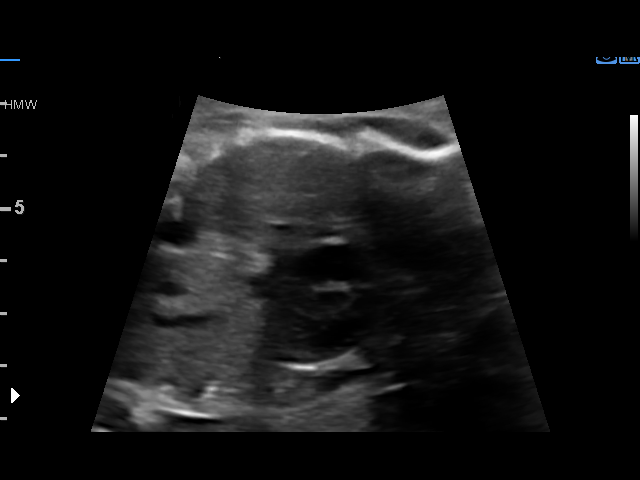
[im 5/16]
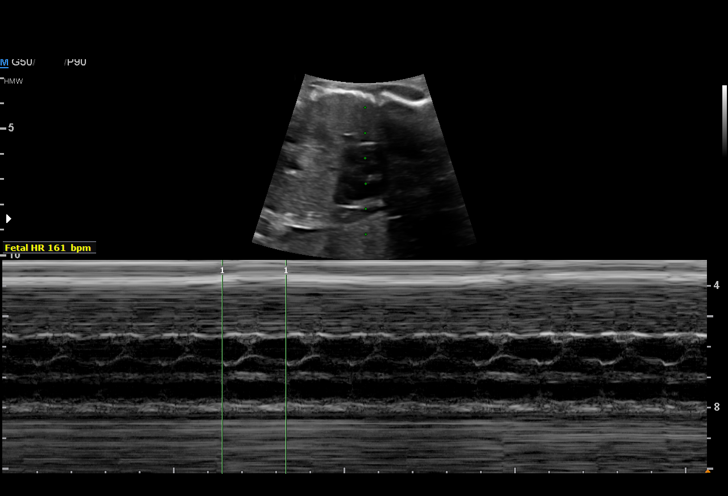
[im 6/16]
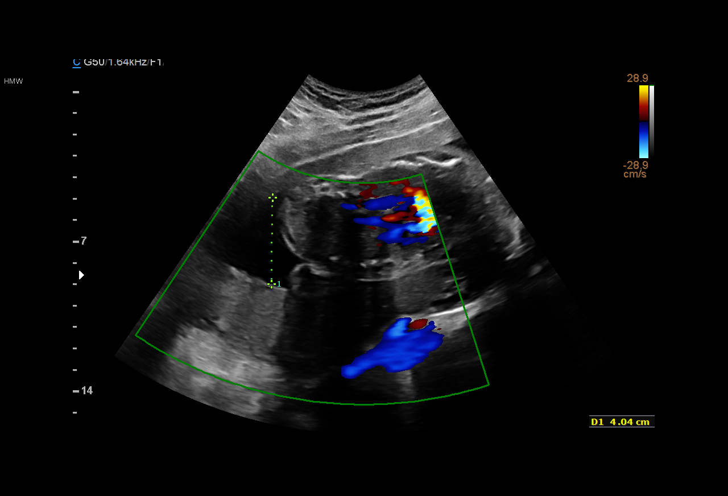
[im 7/16]
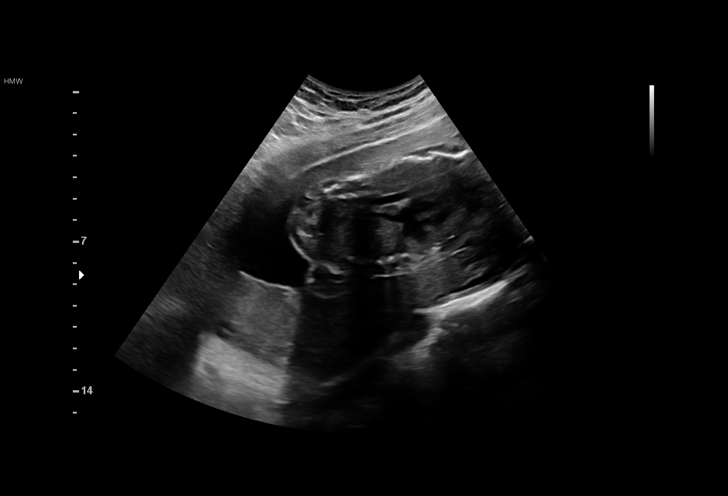
[im 9/16]
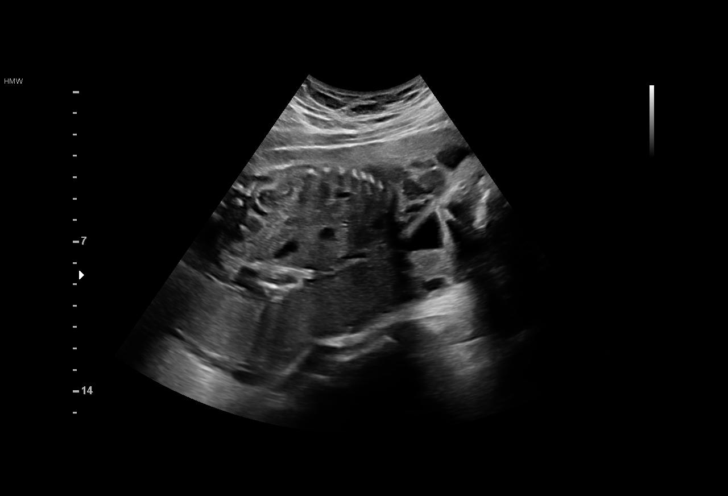
[im 10/16]
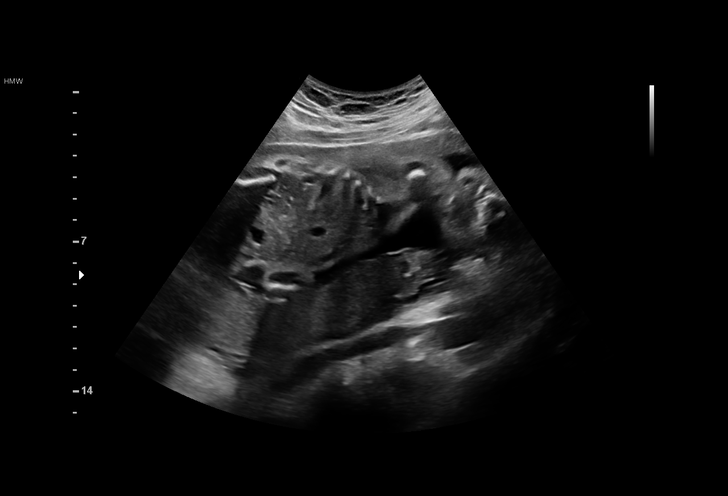
[im 11/16]
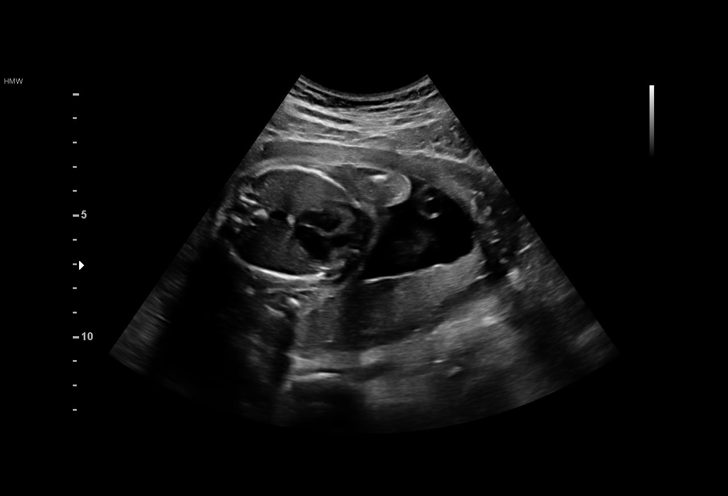
[im 12/16]
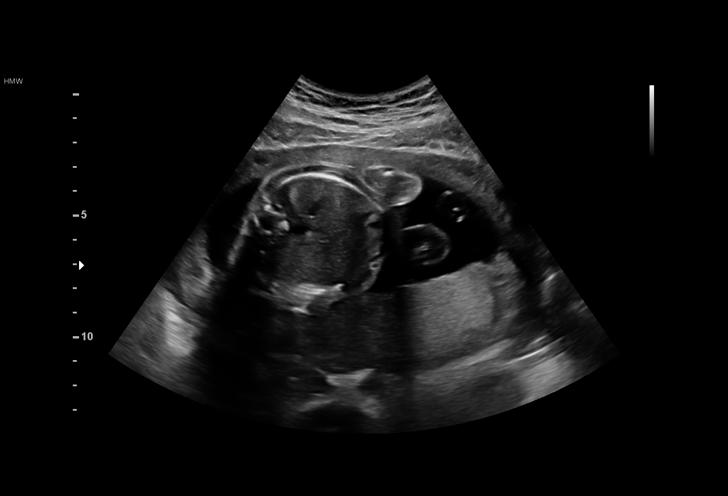
[im 13/16]
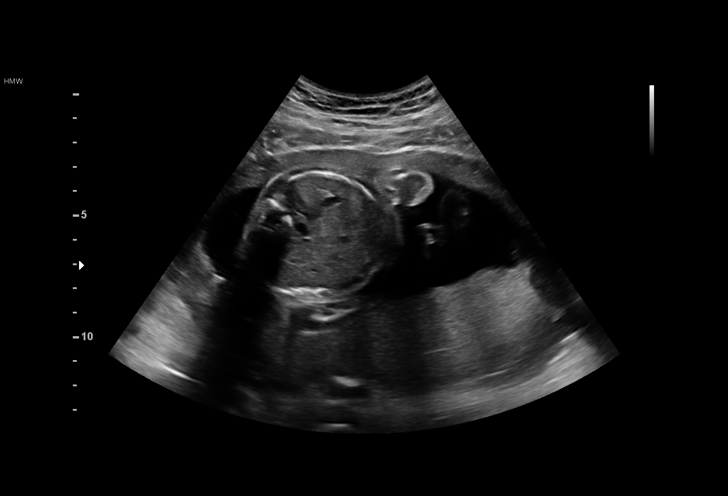
[im 14/16]
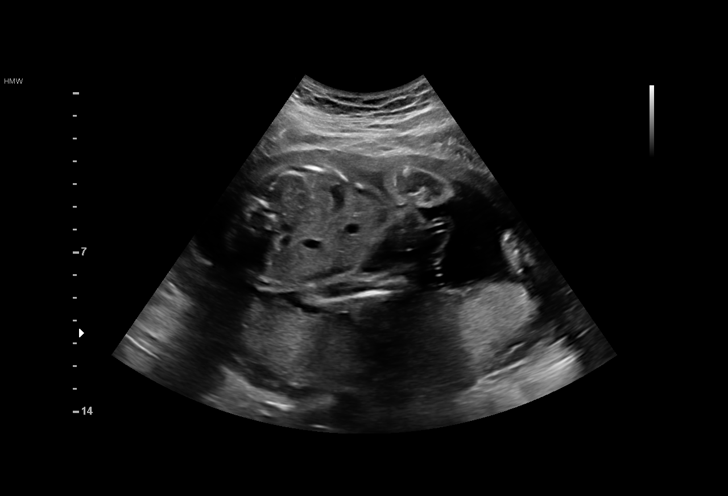
[im 15/16]
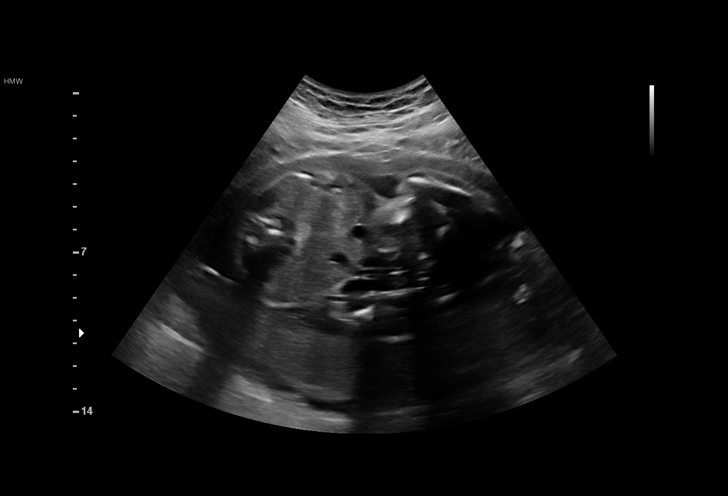
[im 16/16]
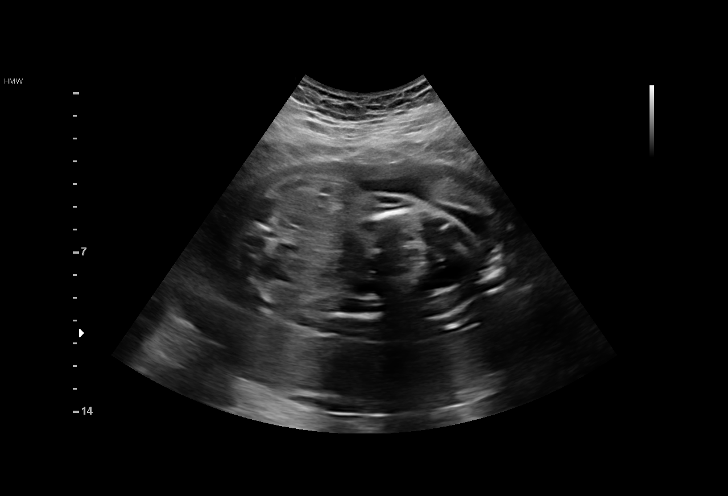

[15 of 16 positions shown; findings below may reference images not displayed]

Performed By:     Nelsi Koshy          Secondary Phy.:   3[facility] Nursing- [facility]
OB
Obstetrics &
Gynecology
[address]
[place].
Obstetrics &
Gynecology
[address]
[place].

1  HENG LPZ             [id number]      [id number]     [id number]
Indications

26 weeks gestation of pregnancy
Determine Fetal presentation by ultrasound
Cervical insufficiency, 2nd
Obesity complicating pregnancy, second
trimester
OB History

Gravidity:    1         Term:   0        Prem:   0        SAB:   0
TOP:          0       Ectopic:  0        Living: 0
Fetal Evaluation

Num Of Fetuses:     1
Fetal Heart         161
Rate(bpm):
Cardiac Activity:   Observed
Presentation:       Cephalic
Placenta:           Posterior, above cervical os
P. Cord Insertion:  Previously Visualized

Amniotic Fluid
AFI FV:      Subjectively within normal limits

Largest Pocket(cm)
4.0
Gestational Age

LMP:           26w 1d        Date:  06/07/16                 EDD:   03/14/17
Best:          26w 1d     Det. By:  LMP  (06/07/16)          EDD:   03/14/17
Cervix Uterus Adnexa

Cervix
Dilated

Uterus
No abnormality visualized.

Left Ovary
No adnexal mass visualized.

Right Ovary
No adnexal mass visualized.

Cul De Sac:   No free fluid seen.

Adnexa:       No abnormality visualized.
Impression

IUP at 26+1 weeks with preterm contractions and preterm
cervical dilation; request for fetal lie
Cephalic presentation
Normal fetal movement and cardiac activity
Normal placentation
Recommendations

Continue clinical evaluation and management

## 2018-10-17 ENCOUNTER — Other Ambulatory Visit: Payer: Self-pay

## 2018-10-17 ENCOUNTER — Ambulatory Visit (INDEPENDENT_AMBULATORY_CARE_PROVIDER_SITE_OTHER): Payer: Medicaid Other | Admitting: Internal Medicine

## 2018-10-17 ENCOUNTER — Encounter: Payer: Self-pay | Admitting: Internal Medicine

## 2018-10-17 VITALS — BP 148/85 | HR 72 | Temp 99.2°F | Ht 67.0 in | Wt 281.5 lb

## 2018-10-17 DIAGNOSIS — K861 Other chronic pancreatitis: Secondary | ICD-10-CM

## 2018-10-17 DIAGNOSIS — K8500 Idiopathic acute pancreatitis without necrosis or infection: Secondary | ICD-10-CM | POA: Diagnosis not present

## 2018-10-17 DIAGNOSIS — K859 Acute pancreatitis without necrosis or infection, unspecified: Secondary | ICD-10-CM

## 2018-10-17 MED ORDER — ONDANSETRON HCL 4 MG PO TABS: 4.0000 mg | ORAL_TABLET | Freq: Every day | ORAL | 0 refills | Status: AC | PRN

## 2018-10-17 NOTE — Progress Notes (Signed)
   CC: ED visit follow up for pancreatitis  HPI:  [name] is a 28 y.o. with PMHx as documented below, presented for follow up of abdominal pain 2/2 pancreatitis and for a follow up of recent ED visit he had Please refer to problem based charting for further details and assessment of plan of current problem and chronic medical conditions.   Past Medical History:  Diagnosis Date  . Herpes   . Hx of chlamydia infection   . Infection    UTI  . Preterm labor    Review of Systems:  Review of Systems  Constitutional: Positive for weight loss.  Gastrointestinal: Positive for abdominal pain and nausea. Negative for blood in stool, constipation, diarrhea and melena.    Physical Exam:  There were no vitals filed for this visit. Physical Exam  Constitutional: Well-developed and well-nourished. No acute distress.  Cardiovascular:  Irregular, no murmur Respiratory: Effort normal, breath sound nl GI: Soft. Bowel sounds are normal. No distension. There is no tenderness.   Assessment & Plan:   See Encounters Tab for problem based charting.  Patient discussed with Dr. Evette Doffing

## 2018-10-17 NOTE — Patient Instructions (Signed)
It was our pleasure taking care of you in our clinic today.  You were seen for follow up of recent hospitalization. I order some blood work and call you about the result if abnormal. I also order ultrasound for you to evaluate your gallbladder.  You can take Zofran once daily for nausea. (try to minimize taking that when breastfeed) Please come back to clinic in 1 week or earlier if your symptoms get worse or not improved. As always, if having severe symptoms, please seek medical attention at emergency room.  Thank you

## 2018-10-18 LAB — CBC WITH DIFFERENTIAL/PLATELET
Basophils Absolute: 0 10*3/uL (ref 0.0–0.2)
Basos: 0 %
EOS (ABSOLUTE): 0.2 10*3/uL (ref 0.0–0.4)
Eos: 3 %
Hematocrit: 35.1 % (ref 34.0–46.6)
Hemoglobin: 11.3 g/dL (ref 11.1–15.9)
Immature Grans (Abs): 0 10*3/uL (ref 0.0–0.1)
Immature Granulocytes: 0 %
Lymphocytes Absolute: 1.7 10*3/uL (ref 0.7–3.1)
Lymphs: 28 %
MCH: 26.7 pg (ref 26.6–33.0)
MCHC: 32.2 g/dL (ref 31.5–35.7)
MCV: 83 fL (ref 79–97)
Monocytes Absolute: 0.4 10*3/uL (ref 0.1–0.9)
Monocytes: 7 %
Neutrophils Absolute: 3.9 10*3/uL (ref 1.4–7.0)
Neutrophils: 62 %
Platelets: 327 10*3/uL (ref 150–450)
RBC: 4.24 x10E6/uL (ref 3.77–5.28)
RDW: 16 % — ABNORMAL HIGH (ref 11.7–15.4)
WBC: 6.1 10*3/uL (ref 3.4–10.8)

## 2018-10-18 LAB — CMP14 + ANION GAP
ALT: 20 IU/L (ref 0–32)
AST: 18 IU/L (ref 0–40)
Albumin/Globulin Ratio: 1.4 (ref 1.2–2.2)
Albumin: 4.1 g/dL (ref 3.9–5.0)
Alkaline Phosphatase: 59 IU/L (ref 39–117)
Anion Gap: 16 mmol/L (ref 10.0–18.0)
BUN/Creatinine Ratio: 12 (ref 9–23)
BUN: 13 mg/dL (ref 6–20)
Bilirubin Total: 0.2 mg/dL (ref 0.0–1.2)
CO2: 22 mmol/L (ref 20–29)
Calcium: 8.9 mg/dL (ref 8.7–10.2)
Chloride: 104 mmol/L (ref 96–106)
Creatinine, Ser: 1.07 mg/dL — ABNORMAL HIGH (ref 0.57–1.00)
GFR calc Af Amer: 83 mL/min/{1.73_m2} (ref >59)
GFR calc non Af Amer: 72 mL/min/{1.73_m2} (ref >59)
Globulin, Total: 2.9 g/dL (ref 1.5–4.5)
Glucose: 80 mg/dL (ref 65–99)
Potassium: 4.3 mmol/L (ref 3.5–5.2)
Sodium: 142 mmol/L (ref 134–144)
Total Protein: 7 g/dL (ref 6.0–8.5)

## 2018-10-18 LAB — LIPID PANEL
Chol/HDL Ratio: 2.8 ratio (ref 0.0–4.4)
Cholesterol, Total: 169 mg/dL (ref 100–199)
HDL: 61 mg/dL (ref >39)
LDL Calculated: 94 mg/dL (ref 0–99)
Triglycerides: 68 mg/dL (ref 0–149)
VLDL Cholesterol Cal: 14 mg/dL (ref 5–40)

## 2018-10-19 ENCOUNTER — Encounter: Payer: Self-pay | Admitting: Internal Medicine

## 2018-10-19 DIAGNOSIS — K859 Acute pancreatitis without necrosis or infection, unspecified: Secondary | ICD-10-CM | POA: Insufficient documentation

## 2018-10-19 NOTE — Progress Notes (Signed)
Internal Medicine Clinic Attending  Case discussed with Dr. Masoudi  at the time of the visit.  We reviewed the resident's history and exam and pertinent patient test results.  I agree with the assessment, diagnosis, and plan of care documented in the resident's note.  

## 2018-10-19 NOTE — Assessment & Plan Note (Signed)
Annette Greene presented for follow-up of recent ED visit due to chest discomfort and abdominal pain (secondary to idiopathic pancreatitis, with elevated lipase at around 1200)  Her symptoms improved at ED, she did not get further work-up and came home.  And referred to PCP or GI. She is currently feeling okay without pain or nausea but mentions that orally she still has abdominal pain and vomiting some days.  Her pain is not related with eating however she feels that her appetite decreased since past few weeks and also she lost some weight. No history of previous similar symptoms, she does not drink alcohol, no history of abnormal lipid profile. On exam she is afebrile, in no acute distress, abdominal exam is unremarkable. (Except uncertain hepatomegaly due to body habitus).  Will work up for common etiology including cholelithiasis, hyper TG,..  -RUQ [place] -Lipid profile -CBC with diff -CMP -F/u with GI (has an appointment (first encounter) next month) -Zofran PRN (try to minimize since patient is breast feeding) -ED visit precautions provided -F/u in clinic in 1 week if symptoms get worse

## 2018-10-25 ENCOUNTER — Ambulatory Visit (HOSPITAL_COMMUNITY): Payer: Medicaid Other

## 2018-11-01 ENCOUNTER — Ambulatory Visit (HOSPITAL_COMMUNITY): Payer: Medicaid Other

## 2018-11-09 ENCOUNTER — Ambulatory Visit: Payer: Medicaid Other | Admitting: Gastroenterology

## 2018-11-22 ENCOUNTER — Ambulatory Visit: Payer: Medicaid Other | Admitting: Nurse Practitioner

## 2018-11-22 ENCOUNTER — Other Ambulatory Visit: Payer: Self-pay

## 2018-11-22 ENCOUNTER — Encounter: Payer: Self-pay | Admitting: Nurse Practitioner

## 2018-11-22 VITALS — BP 102/68 | HR 72 | Temp 98.3°F | Ht 67.0 in | Wt 281.8 lb

## 2018-11-22 DIAGNOSIS — K5900 Constipation, unspecified: Secondary | ICD-10-CM | POA: Diagnosis not present

## 2018-11-22 DIAGNOSIS — R0789 Other chest pain: Secondary | ICD-10-CM | POA: Diagnosis not present

## 2018-11-22 DIAGNOSIS — K859 Acute pancreatitis without necrosis or infection, unspecified: Secondary | ICD-10-CM | POA: Diagnosis not present

## 2018-11-22 DIAGNOSIS — K649 Unspecified hemorrhoids: Secondary | ICD-10-CM | POA: Diagnosis not present

## 2018-11-22 NOTE — Progress Notes (Signed)
ASSESSMENT / PLAN:   65. 28 yo female with acute pancreatitis mid July. No abdominal imaging in ED but lipase 1274. Normal liver tests. Non-drinker. Normal triglycerides. Doesn't take any medications, just prenatal vitamins. No [place] of pancreatic diseases / cancers. Etiology of acute pancreatitis unclear. Symptoms have resolved. RUQ [place] already scheduled though it appears it was cancelled a couple of times?? Will reschedule it for her .  -obtain RUQ [place] -if negative [place] may check IgG4   2. Chest discomfort, intermittent, present since March. Feels like a golf ball in her chest. No heartburn or regurgitation. No dysphagia. At onset patient attributed discomfort to indigestion related to pregnancy but she delivered in May without improvement in discomfort Of note, sometimes has associated SOB. Used son's inhaler a couple of times and symptoms improved.  -she is weaning baby. I would like to give her a short trial of Pepcid AC. I don't think there is risk of fetal harm but she will check with OB. If Pepcid helpful then would continue it for now. Otherwise, if no improvement she will stop Pepcid as GERD seems unlikely   3. Hemorrhoid. She feels like an internal hemorrhoid protrudes from rectum at times. No associated bleeding or pain. She asks about removing the hemorrhoids. Problem temporarily resolves with steroid cream (externally). Sometimes strains to have BM. She doubts her diet is rich in fiber.  -Citrucel daily -Stays thirsty, frequently feels dehydrated. Drink 6-8 glasses water daily -Prep H -apply internally with applicator tip bid x 10 days -If no improvement then happy to see her back for DRE and discuss banding if hemorrhoids are internal.  4 Incomplete RBBB on EKG in ED   HPI:    Chief Complaint:  pancreatitis   Annette Greene is a 28 yo female referred by Dr. Myrtie Hawk for pancreatitis. Around 11pm on 7/19 Annette Greene developed chest pain and upper abdominal cramping radiating  through to her back with associated vomiting.  Felt like an elephant was sitting on her chest. She went to ED around 3pm . Lipase markedly elevated. Pain resolved in ED, discharged with Hydrocodone. Saw PCP 7/28. Trig level normal. RUQ [place] in process of being scheduled. Referred to [place] in the interim.   Annette Greene does not consume Etoh. No [place] of pancreatic diseases / cancers She had a baby boy in May. Weaning baby from breastfeeding. She takes prenatal vitamins, no medications. She hasn't had any recent abdominal pain nor nausea. Eating regular diet. No weight loss, she is gaining.   Patient described chest discomfort since March. Thought it was indigestion related to pregnancy but didn't improve with delivery. Feel like a golf ball in her chest. Sometimes gets SOB. Used son's albuterol inhaler a few times and it helped. No heartburn, regurgitation or dysphagia.    Data Reviewed:   ED 10/07/18 Lipase 1274 Liver tests nl WBC nl hgb 11.6, MCV 83 HCV negative  10/17/18 Trig 68  Past Medical History:  Diagnosis Date  . Herpes   . Hx of chlamydia infection   . Infection    UTI  . Preterm labor      Past Surgical History:  Procedure Laterality Date  . CERVICAL CERCLAGE N/A 01/25/2018   Procedure: CERCLAGE CERVICAL;  Surgeon: Everett Graff, MD;  Location: [place];  Service: Gynecology;  Laterality: N/A;  . TONSILLECTOMY AND ADENOIDECTOMY    . WISDOM TOOTH EXTRACTION     all  4 teeth   Family History  Problem Relation Age of Onset  . Healthy Mother   . Hypertension Mother   . Healthy Father   . Healthy Sister   . Healthy Maternal Grandmother   . Hypertension Maternal Grandmother   . Healthy Paternal Grandmother   . Healthy Paternal Grandfather   . Healthy Sister   . Drug abuse Maternal Aunt   . Hypertension Maternal Aunt    Social History   Tobacco Use  . Smoking status: Former Smoker    Packs/day: 0.50    Types: Cigarettes    Quit date: 06/24/2016    Years since  quitting: 2.4  . Smokeless tobacco: Never Used  . Tobacco comment: stopped since pregnant  Substance Use Topics  . Alcohol use: Not Currently    Comment: stopped with pregnancy  . Drug use: Not Currently   Current Outpatient Medications  Medication Sig Dispense Refill  . acetaminophen (TYLENOL) 500 MG tablet Take 500 mg by mouth every 6 (six) hours as needed for headache.    Marland Kitchen HYDROcodone-acetaminophen (NORCO/VICODIN) 5-325 MG tablet Take 1 tablet by mouth every 6 (six) hours as needed. 10 tablet 0  . naproxen (NAPROSYN) 500 MG tablet Take 1 tablet (500 mg total) by mouth 2 (two) times daily. 10 tablet 0  . ondansetron (ZOFRAN) 4 MG tablet Take 1 tablet (4 mg total) by mouth daily as needed for nausea or vomiting. 5 tablet 0  . Prenatal Vit-Fe Fumarate-FA (MULTIVITAMIN-PRENATAL) 27-0.8 MG TABS tablet Take 1 tablet by mouth daily.    Marland Kitchen senna-docusate (SENOKOT-S) 8.6-50 MG tablet Take 2 tablets by mouth daily. (Patient not taking: Reported on 07/24/2018) 20 tablet 0   No current facility-administered medications for this visit.    No Known Allergies   Review of Systems: All systems reviewed and negative except where noted in HPI.   Creatinine clearance cannot be calculated (Patient's most recent lab result is older than the maximum 21 days allowed.)   Physical Exam:    Wt Readings from Last 3 Encounters:  11/22/18 281 lb 12.8 oz (127.8 kg)  10/17/18 281 lb 8 oz (127.7 kg)  08/27/18 273 lb (123.8 kg)    Wt 281 lb 12.8 oz (127.8 kg)   BMI 44.14 kg/m  Constitutional:  Pleasant female in no acute distress. Psychiatric: Normal mood and affect. Behavior is normal. EENT: Pupils normal.  Conjunctivae are normal. No scleral icterus. Neck supple.  Cardiovascular: Normal rate, regular rhythm. No edema Pulmonary/chest: Effort normal and breath sounds normal. No wheezing, rales or rhonchi. Abdominal: Soft, nondistended, nontender. Bowel sounds active throughout. There are no masses  palpable. No hepatomegaly. Neurological: Alert and oriented to person place and time. Skin: Skin is warm and dry. No rashes noted.  Tye Savoy, NP  11/22/2018, 2:10 PM  Cc:  Masoudi, [place], *

## 2018-11-22 NOTE — Patient Instructions (Addendum)
If you are age 26 or older, your body mass index should be between 23-30. Your Body mass index is 44.14 kg/m. If this is out of the aforementioned range listed, please consider follow up with your Primary Care Provider.  If you are age 88 or younger, your body mass index should be between 19-25. Your Body mass index is 44.14 kg/m. If this is out of the aformentioned range listed, please consider follow up with your Primary Care Provider.   You have been scheduled for an abdominal ultrasound at [facility] (1st floor of hospital) on 11/28/18 at 10 am. Please arrive 15 minutes prior to your appointment for registration. Make certain not to have anything to eat or drink 6 after midnight the night prior to your appointment. Should you need to reschedule your appointment, please contact radiology at [phone]. This test typically takes about 30 minutes to perform.  Drink 6-8 glasses of water daily.  Start Preparation H (inside) twice daily for ten days. ( this is over-the-counter)  Start Citrucel once daily. (this is over-the-counter)  Try Pepcid AC before breakfast for one week (this is over-the-counter) Dump breast milk If this helps then continue but also ask OB physician about it.  Start Colace one at bedtime.  (this is over-the-counter)  We will call you with results.  Thank you for choosing me and [facility].   Tye Savoy, NP

## 2018-11-22 NOTE — Progress Notes (Signed)
I agree with the above note, plan 

## 2018-11-28 ENCOUNTER — Ambulatory Visit (HOSPITAL_COMMUNITY): Payer: Medicaid Other

## 2018-12-05 ENCOUNTER — Other Ambulatory Visit: Payer: Self-pay | Admitting: Registered"

## 2018-12-05 DIAGNOSIS — R6889 Other general symptoms and signs: Secondary | ICD-10-CM | POA: Diagnosis not present

## 2018-12-05 DIAGNOSIS — Z20822 Contact with and (suspected) exposure to covid-19: Secondary | ICD-10-CM

## 2018-12-07 LAB — NOVEL CORONAVIRUS, NAA: SARS-CoV-2, NAA: NOT DETECTED

## 2018-12-18 DIAGNOSIS — H5213 Myopia, bilateral: Secondary | ICD-10-CM | POA: Diagnosis not present

## 2018-12-19 ENCOUNTER — Encounter: Payer: Self-pay | Admitting: Internal Medicine

## 2018-12-19 ENCOUNTER — Other Ambulatory Visit: Payer: Self-pay

## 2018-12-19 ENCOUNTER — Ambulatory Visit (INDEPENDENT_AMBULATORY_CARE_PROVIDER_SITE_OTHER): Payer: Medicaid Other | Admitting: Internal Medicine

## 2018-12-19 VITALS — BP 130/71 | HR 92 | Temp 98.8°F | Wt 269.5 lb

## 2018-12-19 DIAGNOSIS — L732 Hidradenitis suppurativa: Secondary | ICD-10-CM | POA: Insufficient documentation

## 2018-12-19 DIAGNOSIS — H5213 Myopia, bilateral: Secondary | ICD-10-CM | POA: Diagnosis not present

## 2018-12-19 MED ORDER — CLINDAMYCIN PHOSPHATE 1 % EX GEL: Freq: Two times a day (BID) | CUTANEOUS | 1 refills | Status: DC

## 2018-12-19 NOTE — Patient Instructions (Addendum)
Annette Greene,  It was nice meeting you today. I believe your underarm wound is related to a disease process called Hidradenitis Suppurativa. Continue to use warm compresses on the area while it heals. Additionally, I have prescribed clindamycin gel to apply to any affected areas (your underarms, groin, and buttock) twice a day. Please continue to follow up with your primary doctor here in clinic for routine care.    Hidradenitis Suppurativa Hidradenitis suppurativa is a long-term (chronic) skin disease. It is similar to a severe form of acne, but it affects areas of the body where acne would be unusual, especially areas of the body where skin rubs against skin and becomes moist. These include:  Underarms.  Groin.  Genital area.  Buttocks.  Upper thighs.  Breasts. Hidradenitis suppurativa may start out as small lumps or pimples caused by blocked sweat glands or hair follicles. Pimples may develop into deep sores that break open (rupture) and drain pus. Over time, affected areas of skin may thicken and become scarred. This condition is rare and does not spread from person to person (non-contagious). What are the causes? The exact cause of this condition is not known. It may be related to:  Female and female hormones.  An overactive disease-fighting system (immune system). The immune system may over-react to blocked hair follicles or sweat glands and cause swelling and pus-filled sores. What increases the risk? You are more likely to develop this condition if you:  Are female.  Are 66-66 years old.  Have a family history of hidradenitis suppurativa.  Have a personal history of acne.  Are overweight.  Smoke.  Take the medicine lithium. What are the signs or symptoms? The first symptoms are usually painful bumps in the skin, similar to pimples. The condition may get worse over time (progress), or it may only cause mild symptoms. If the disease progresses, symptoms may include:  Skin  bumps getting bigger and growing deeper into the skin.  Bumps rupturing and draining pus.  Itchy, infected skin.  Skin getting thicker and scarred.  Tunnels under the skin (fistulas) where pus drains from a bump.  Pain during daily activities, such as pain during walking if your groin area is affected.  Emotional problems, such as stress or depression. This condition may affect your appearance and your ability or willingness to wear certain clothes or do certain activities. How is this diagnosed? This condition is diagnosed by a health care provider who specializes in skin diseases (dermatologist). You may be diagnosed based on:  Your symptoms and medical history.  A physical exam.  Testing a pus sample for infection.  Blood tests. How is this treated? Your treatment will depend on how severe your symptoms are. The same treatment will not work for everybody with this condition. You may need to try several treatments to find what works best for you. Treatment may include:  Cleaning and bandaging (dressing) your wounds as needed.  Lifestyle changes, such as new skin care routines.  Taking medicines, such as: ? Antibiotics. ? Acne medicines. ? Medicines to reduce the activity of the immune system. ? A diabetes medicine (metformin). ? Birth control pills, for women. ? Steroids to reduce swelling and pain.  Working with a mental health care provider, if you experience emotional distress due to this condition. If you have severe symptoms that do not get better with medicine, you may need surgery. Surgery may involve:  Using a laser to clear the skin and remove hair follicles.  Opening and  draining deep sores.  Removing the areas of skin that are diseased and scarred. Follow these instructions at home: Medicines   Take over-the-counter and prescription medicines only as told by your health care provider.  If you were prescribed an antibiotic medicine, take it as told by  your health care provider. Do not stop taking the antibiotic even if your condition improves. Skin care  If you have open wounds, cover them with a clean dressing as told by your health care provider. Keep wounds clean by washing them gently with soap and water when you bathe.  Do not shave the areas where you get hidradenitis suppurativa.  Do not wear deodorant.  Wear loose-fitting clothes.  Try to avoid getting overheated or sweaty. If you get sweaty or wet, change into clean, dry clothes as soon as you can.  To help relieve pain and itchiness, cover sore areas with a warm, clean washcloth (warm compress) for 5-10 minutes as often as needed.  If told by your health care provider, take a bleach bath twice a week: ? Fill your bathtub halfway with water. ? Pour in  cup of unscented household bleach. ? Soak in the tub for 5-10 minutes. ? Only soak from the neck down. Avoid water on your face and hair. ? Shower to rinse off the bleach from your skin. General instructions  Learn as much as you can about your disease so that you have an active role in your treatment. Work closely with your health care provider to find treatments that work for you.  If you are overweight, work with your health care provider to lose weight as recommended.  Do not use any products that contain nicotine or tobacco, such as cigarettes and e-cigarettes. If you need help quitting, ask your health care provider.  If you struggle with living with this condition, talk with your health care provider or work with a mental health care provider as recommended.  Keep all follow-up visits as told by your health care provider. This is important. Where to find more information  Hidradenitis [place].: https://www.hs-foundation.org/ Contact a health care provider if you have:  A flare-up of hidradenitis suppurativa.  A fever or chills.  Trouble controlling your symptoms at home.  Trouble doing  your daily activities because of your symptoms.  Trouble dealing with emotional problems related to your condition. Summary  Hidradenitis suppurativa is a long-term (chronic) skin disease. It is similar to a severe form of acne, but it affects areas of the body where acne would be unusual.  The first symptoms are usually painful bumps in the skin, similar to pimples. The condition may get worse over time (progress), or it may only cause mild symptoms.  If you have open wounds, cover them with a clean dressing as told by your health care provider. Keep wounds clean by washing them gently with soap and water when you bathe.  Besides skin care, treatment may include medicines, laser treatment, and surgery. This information is not intended to replace advice given to you by your health care provider. Make sure you discuss any questions you have with your health care provider. Document Released: 10/21/2003 Document Revised: 03/16/2017 Document Reviewed: 03/16/2017 Elsevier Patient Education  2020 [employer].

## 2018-12-19 NOTE — Progress Notes (Signed)
   CC: underarm abscess   HPI:  AnnetteAnnette Greene is a 28 y.o. F with significant PMH as outlined below who presents with a wound under her L arm. Please see problem-based charting for additional information.   Past Medical History:  Diagnosis Date  . Herpes   . Hx of chlamydia infection   . Infection    UTI  . Preterm labor    Review of Systems:   Review of Systems  Constitutional: Negative for chills and fever.  Respiratory: Negative for shortness of breath.   Cardiovascular: Negative for chest pain.  Gastrointestinal: Negative for abdominal pain, nausea and vomiting.  Musculoskeletal: Negative for joint pain.  Skin: Negative for rash.       L underarm abscess, minimal drainage.   Physical Exam:  Vitals:   12/19/18 1520  BP: 130/71  Pulse: 92  Temp: 98.8 F (37.1 C)  TempSrc: Oral  SpO2: 99%  Weight: 269 lb 8 oz (122.2 kg)   Physical Exam Vitals signs and nursing note reviewed.  Constitutional:      General: She is not in acute distress.    Appearance: She is obese.  Cardiovascular:     Rate and Rhythm: Normal rate and regular rhythm.     Heart sounds: Normal heart sounds.  Pulmonary:     Effort: Pulmonary effort is normal.     Breath sounds: Normal breath sounds.  Abdominal:     General: Abdomen is flat. Bowel sounds are normal.     Palpations: Abdomen is soft.  Skin:    Comments: L underarm lesion with 1cm area of ulceration and surrounding induration. Entire underarm tender to palpation. No underlying areas of fluctuance appreciated.   Neurological:     Mental Status: She is alert.         Assessment & Plan:   See Encounters Tab for problem based charting.  Patient seen with Dr. Lynnae January

## 2018-12-25 NOTE — Progress Notes (Signed)
Internal Medicine Clinic Attending  I saw and evaluated the patient.  I personally confirmed the key portions of the history and exam documented by Dr. Jones and I reviewed pertinent patient test results.  The assessment, diagnosis, and plan were formulated together and I agree with the documentation in the resident's note.     

## 2018-12-25 NOTE — Assessment & Plan Note (Signed)
Pt presents with L underarm wound that began 3-4 days ago. Denies fevers, chills, nausea, or vomiting at home. States the wound "broke up" this morning before her appointment. Pt endorses these wounds as foul smelling and painful. She has a prior history of wounds like this one in both her underarms, groin, and gluteal cleft for the past 3 years. Most recently treated in June of this year with PO doxycycline and I&D. On exam, pt has a wound under her L arm with a small area of ulceration and surrounding induration that is tender. No appreciable fluctuance or drainage.    When asked if the pt had been told of a diagnosis of hidradenitis suppurativa in the past, she stated she heard about it but did not remember the details. Discussed with her how hidradenitis is a chronic autoimmune disease where treatment is focused on the severity of symptoms. Because of pt's increasing frequency of symptomatic lesions, she is interested in additional treatment options at this time.  Assessment - Acute symptomatic lesion of hidradenitis suppurativa  Plan - warm compresses to the area - pain control with over the counter NSAIDs as needed - will prescribe topical clindamycin gel to hopefully reduce disease burden - would recommend derm referral if disease progresses  Discussed risk of using clindamycin gel while breastfeeding as it is not known if topical clindamycin is present in breast milk. Instructed pt that it is likely there is minimal systemic absorption with the gel which would minimize exposure to her infant, but this is an hypothesis. Discussed that it is recommended that systemic clindamycin be avoided in breastfeeding women. Pt expressed understanding in the risks of this medication was interested in using the clindamycin gel stating that she can supplement with formula as needed. Educated the pt to monitor her infant for GI disturbances, bloody stools, or diarrhea and stop the clindamycin and notify her  pediatrician if present.

## 2018-12-26 ENCOUNTER — Other Ambulatory Visit: Payer: Self-pay

## 2018-12-26 ENCOUNTER — Ambulatory Visit (HOSPITAL_COMMUNITY)
Admission: RE | Admit: 2018-12-26 | Discharge: 2018-12-26 | Disposition: A | Discharge status: Home / Self Care | Payer: Medicaid Other | Source: Ambulatory Visit | Attending: Nurse Practitioner | Admitting: Nurse Practitioner

## 2018-12-26 ENCOUNTER — Encounter (HOSPITAL_COMMUNITY): Payer: Self-pay

## 2018-12-26 DIAGNOSIS — K859 Acute pancreatitis without necrosis or infection, unspecified: Secondary | ICD-10-CM

## 2019-01-04 ENCOUNTER — Telehealth: Payer: Self-pay | Admitting: *Deleted

## 2019-01-04 NOTE — Telephone Encounter (Signed)
N C Tracks- Patient's insurance does not cover Clindamycin Phosphate 1% Gel.  Does cover the Clindamycin Phosphate  Pledgets (Cleocin-T) , Clindamycin-Benzoyl Perixide Gel(Duac), Message sent to Dr. Myrtie Hawk to  consider a change. Sander Nephew, RN 01/04/2019 9:26 AM

## 2019-01-10 ENCOUNTER — Other Ambulatory Visit: Payer: Self-pay | Admitting: Internal Medicine

## 2019-01-10 DIAGNOSIS — L732 Hidradenitis suppurativa: Secondary | ICD-10-CM

## 2019-01-10 MED ORDER — CLINDAMYCIN PHOSPHATE 1 % EX SOLN: CUTANEOUS | 0 refills | Status: DC

## 2019-01-17 DIAGNOSIS — H5213 Myopia, bilateral: Secondary | ICD-10-CM | POA: Diagnosis not present

## 2019-01-24 ENCOUNTER — Other Ambulatory Visit: Payer: Self-pay

## 2019-01-24 ENCOUNTER — Ambulatory Visit (INDEPENDENT_AMBULATORY_CARE_PROVIDER_SITE_OTHER): Payer: Medicaid Other | Admitting: Advanced Practice Midwife

## 2019-01-24 ENCOUNTER — Encounter: Payer: Self-pay | Admitting: Advanced Practice Midwife

## 2019-01-24 VITALS — BP 125/68 | HR 87 | Ht 67.0 in | Wt 282.5 lb

## 2019-01-24 DIAGNOSIS — N912 Amenorrhea, unspecified: Secondary | ICD-10-CM

## 2019-01-24 DIAGNOSIS — Z0000 Encounter for general adult medical examination without abnormal findings: Secondary | ICD-10-CM | POA: Diagnosis not present

## 2019-01-24 LAB — POCT PREGNANCY, URINE: Preg Test, Ur: NEGATIVE

## 2019-01-24 NOTE — Progress Notes (Signed)
GYNECOLOGY PROBLEM NOTE  History:     Annette Greene is a 28 y.o. G29P1102 female here for a routine annual gynecologic exam.  Current complaints: patient endorses two month history of amenorrhea. She is s/p SVD 07/21/18 and endorses two days of intermittent spotting during both July and August. She is breastfeeding her baby every 3 hours. She also supplements with formula but states she only uses one can of formula each month. She is not sexually active and does not desire pregnancy. Denies abnormal pain, abnormal vaginal discharge, pelvic pain, problems with intercourse or other gynecologic concerns.     Patient lives with her two children. She denies thoughts of self-harm, harm to others. Currently abstinent and denies concern for IPV.  Gynecologic History Patient's last menstrual period was 10/24/2018 (approximate). Contraception: none Last Pap: 08/26/2016 (per scanned [facility] records in [place], results were Normal   Obstetric History OB History  Gravida Para Term Preterm AB Living  2 2 1 1   2   SAB TAB Ectopic Multiple Live Births        0 2    # Outcome Date GA Lbr Len/2nd Weight Sex Delivery Anes PTL Lv  2 Term 07/21/18 [redacted]w[redacted]d 11:05 / 00:25 6 lb 12.8 oz (3.085 kg) M Vag-Spont EPI  LIV  1 Preterm 12/08/16 [redacted]w[redacted]d   M Vag-Spont None  LIV    Past Medical History:  Diagnosis Date  . Herpes   . Hx of chlamydia infection   . Infection    UTI  . Preterm labor     Past Surgical History:  Procedure Laterality Date  . CERVICAL CERCLAGE N/A 01/25/2018   Procedure: CERCLAGE CERVICAL;  Surgeon: Everett Graff, MD;  Location: [facility];  Service: Gynecology;  Laterality: N/A;  . TONSILLECTOMY AND ADENOIDECTOMY    . WISDOM TOOTH EXTRACTION     all 4 teeth    Current Outpatient Medications on File Prior to Visit  Medication Sig Dispense Refill  . acetaminophen (TYLENOL) 500 MG tablet Take 500 mg by mouth every 6 (six) hours as needed for headache.    . Prenatal Vit-Fe  Fumarate-FA (MULTIVITAMIN-PRENATAL) 27-0.8 MG TABS tablet Take 1 tablet by mouth daily.    . clindamycin (CLEOCIN-T) 1 % external solution Apply to affected area 2 times daily (Patient not taking: Reported on 01/24/2019) 60 mL 0  . HYDROcodone-acetaminophen (NORCO/VICODIN) 5-325 MG tablet Take 1 tablet by mouth every 6 (six) hours as needed. (Patient not taking: Reported on 01/24/2019) 10 tablet 0  . naproxen (NAPROSYN) 500 MG tablet Take 1 tablet (500 mg total) by mouth 2 (two) times daily. (Patient not taking: Reported on 01/24/2019) 10 tablet 0  . ondansetron (ZOFRAN) 4 MG tablet Take 1 tablet (4 mg total) by mouth daily as needed for nausea or vomiting. (Patient not taking: Reported on 01/24/2019) 5 tablet 0  . senna-docusate (SENOKOT-S) 8.6-50 MG tablet Take 2 tablets by mouth daily. (Patient not taking: Reported on 07/24/2018) 20 tablet 0   No current facility-administered medications on file prior to visit.     No Known Allergies  Social History:  reports that she quit smoking about 2 years ago. Her smoking use included cigarettes. She smoked 0.50 packs per day. She has never used smokeless tobacco. She reports previous alcohol use. She reports previous drug use.  Family History  Problem Relation Age of Onset  . Healthy Mother   . Hypertension Mother   . Healthy Father   . Healthy Sister   .  Healthy Maternal Grandmother   . Hypertension Maternal Grandmother   . Healthy Paternal Grandmother   . Healthy Paternal Grandfather   . Healthy Sister   . Drug abuse Maternal Aunt   . Hypertension Maternal Aunt     The following portions of the patient's history were reviewed and updated as appropriate: allergies, current medications, past family history, past medical history, past social history, past surgical history and problem list.  Review of Systems Pertinent items noted in HPI and remainder of comprehensive ROS otherwise negative.  Physical Exam:  BP 125/68   Pulse 87   Ht 5\' 7"   (1.702 m)   Wt 282 lb 8 oz (128.1 kg)   LMP 10/24/2018 (Approximate)   BMI 44.25 kg/m  CONSTITUTIONAL: Well-developed, well-nourished female in no acute distress.  HENT:  Normocephalic, atraumatic, External right and left ear normal. Oropharynx is clear and moist EYES: Conjunctivae and EOM are normal. Pupils are equal, round, and reactive to light. No scleral icterus.  NECK: Normal range of motion, supple, no masses.  Normal thyroid.  SKIN: Skin is warm and dry. No rash noted. Not diaphoretic. No erythema. No pallor. MUSCULOSKELETAL: Normal range of motion. No tenderness.  No cyanosis, clubbing, or edema.  2+ distal pulses. NEUROLOGIC: Alert and oriented to person, place, and time. Normal reflexes, muscle tone coordination. No cranial nerve deficit noted. PSYCHIATRIC: Normal mood and affect. Normal behavior. Normal judgment and thought content. CARDIOVASCULAR: Normal heart rate noted, regular rhythm RESPIRATORY:  Effort normal, no problems with respiration noted.   Assessment and Plan:    1. Amenorrhea - Discussed risk factors and possible workup for amenorrhea which dovetail with well woman appointment including CBC, TSH, HgbA1C . Patient declines  2. Lactating mother - Discussed lactational amenorrhea as possible causative agent  3. SVD (spontaneous vaginal delivery) - 07/21/2018  Routine preventative health maintenance measures emphasized. Please refer to After Visit Summary for other counseling recommendations.      Return to clinic 08/2019 for well woman with pap smear  Total visit time 30 minutes. Greater than 50% of visit spent in counseling and coordination of care.  Mallie Snooks, MSN, CNM Certified Nurse Midwife, [employer] for [employer], [facility] 01/24/19 4:12 PM

## 2019-01-24 NOTE — Patient Instructions (Signed)
Preventive Care 21-26 Years Old, Female Preventive care refers to visits with your health care provider and lifestyle choices that can promote health and wellness. This includes:  A yearly physical exam. This may also be called an annual well check.  Regular dental visits and eye exams.  Immunizations.  Screening for certain conditions.  Healthy lifestyle choices, such as eating a healthy diet, getting regular exercise, not using drugs or products that contain nicotine and tobacco, and limiting alcohol use. What can I expect for my preventive care visit? Physical exam Your health care provider will check your:  Height and weight. This may be used to calculate body mass index (BMI), which tells if you are at a healthy weight.  Heart rate and blood pressure.  Skin for abnormal spots. Counseling Your health care provider may ask you questions about your:  Alcohol, tobacco, and drug use.  Emotional well-being.  Home and relationship well-being.  Sexual activity.  Eating habits.  Work and work environment.  Method of birth control.  Menstrual cycle.  Pregnancy history. What immunizations do I need?  Influenza (flu) vaccine  This is recommended every year. Tetanus, diphtheria, and pertussis (Tdap) vaccine  You may need a Td booster every 10 years. Varicella (chickenpox) vaccine  You may need this if you have not been vaccinated. Human papillomavirus (HPV) vaccine  If recommended by your health care provider, you may need three doses over 6 months. Measles, mumps, and rubella (MMR) vaccine  You may need at least one dose of MMR. You may also need a second dose. Meningococcal conjugate (MenACWY) vaccine  One dose is recommended if you are age 19-21 years and a first-year college student living in a residence hall, or if you have one of several medical conditions. You may also need additional booster doses. Pneumococcal conjugate (PCV13) vaccine  You may need  this if you have certain conditions and were not previously vaccinated. Pneumococcal polysaccharide (PPSV23) vaccine  You may need one or two doses if you smoke cigarettes or if you have certain conditions. Hepatitis A vaccine  You may need this if you have certain conditions or if you travel or work in places where you may be exposed to hepatitis A. Hepatitis B vaccine  You may need this if you have certain conditions or if you travel or work in places where you may be exposed to hepatitis B. Haemophilus influenzae type b (Hib) vaccine  You may need this if you have certain conditions. You may receive vaccines as individual doses or as more than one vaccine together in one shot (combination vaccines). Talk with your health care provider about the risks and benefits of combination vaccines. What tests do I need?  Blood tests  Lipid and cholesterol levels. These may be checked every 5 years starting at age 20.  Hepatitis C test.  Hepatitis B test. Screening  Diabetes screening. This is done by checking your blood sugar (glucose) after you have not eaten for a while (fasting).  Sexually transmitted disease (STD) testing.  BRCA-related cancer screening. This may be done if you have a family history of breast, ovarian, tubal, or peritoneal cancers.  Pelvic exam and Pap test. This may be done every 3 years starting at age 21. Starting at age 30, this may be done every 5 years if you have a Pap test in combination with an HPV test. Talk with your health care provider about your test results, treatment options, and if necessary, the need for more tests.   Follow these instructions at home: Eating and drinking   Eat a diet that includes fresh fruits and vegetables, whole grains, lean protein, and low-fat dairy.  Take vitamin and mineral supplements as recommended by your health care provider.  Do not drink alcohol if: ? Your health care provider tells you not to drink. ? You are  pregnant, may be pregnant, or are planning to become pregnant.  If you drink alcohol: ? Limit how much you have to 0-1 drink a day. ? Be aware of how much alcohol is in your drink. In the [place] one drink equals one 12 oz bottle of beer (355 mL), one 5 oz glass of wine (148 mL), or one 1 oz glass of hard liquor (44 mL). Lifestyle  Take daily care of your teeth and gums.  Stay active. Exercise for at least 30 minutes on 5 or more days each week.  Do not use any products that contain nicotine or tobacco, such as cigarettes, e-cigarettes, and chewing tobacco. If you need help quitting, ask your health care provider.  If you are sexually active, practice safe sex. Use a condom or other form of birth control (contraception) in order to prevent pregnancy and STIs (sexually transmitted infections). If you plan to become pregnant, see your health care provider for a preconception visit. What's next?  Visit your health care provider once a year for a well check visit.  Ask your health care provider how often you should have your eyes and teeth checked.  Stay up to date on all vaccines. This information is not intended to replace advice given to you by your health care provider. Make sure you discuss any questions you have with your health care provider. Document Released: 05/04/2001 Document Revised: 11/17/2017 Document Reviewed: 11/17/2017 Elsevier Patient Education  2020 Elsevier Inc.  

## 2019-05-01 ENCOUNTER — Other Ambulatory Visit: Payer: Medicaid Other

## 2019-05-01 ENCOUNTER — Encounter: Payer: Self-pay | Admitting: *Deleted

## 2019-08-10 DIAGNOSIS — Z20828 Contact with and (suspected) exposure to other viral communicable diseases: Secondary | ICD-10-CM | POA: Diagnosis not present

## 2019-08-10 DIAGNOSIS — J018 Other acute sinusitis: Secondary | ICD-10-CM | POA: Diagnosis not present

## 2019-08-10 DIAGNOSIS — R05 Cough: Secondary | ICD-10-CM | POA: Diagnosis not present

## 2019-08-10 DIAGNOSIS — Z03818 Encounter for observation for suspected exposure to other biological agents ruled out: Secondary | ICD-10-CM | POA: Diagnosis not present

## 2019-08-14 ENCOUNTER — Other Ambulatory Visit: Payer: Self-pay

## 2019-08-14 ENCOUNTER — Emergency Department (HOSPITAL_BASED_OUTPATIENT_CLINIC_OR_DEPARTMENT_OTHER)
Admission: EM | Admit: 2019-08-14 | Discharge: 2019-08-14 | Disposition: A | Discharge status: Home / Self Care | Payer: Medicaid Other | Attending: Emergency Medicine | Admitting: Emergency Medicine

## 2019-08-14 ENCOUNTER — Encounter (HOSPITAL_BASED_OUTPATIENT_CLINIC_OR_DEPARTMENT_OTHER): Payer: Self-pay | Admitting: Emergency Medicine

## 2019-08-14 DIAGNOSIS — W268XXA Contact with other sharp object(s), not elsewhere classified, initial encounter: Secondary | ICD-10-CM | POA: Insufficient documentation

## 2019-08-14 DIAGNOSIS — Z87891 Personal history of nicotine dependence: Secondary | ICD-10-CM | POA: Insufficient documentation

## 2019-08-14 DIAGNOSIS — S81812A Laceration without foreign body, left lower leg, initial encounter: Secondary | ICD-10-CM | POA: Diagnosis not present

## 2019-08-14 DIAGNOSIS — Y929 Unspecified place or not applicable: Secondary | ICD-10-CM | POA: Insufficient documentation

## 2019-08-14 DIAGNOSIS — Z79899 Other long term (current) drug therapy: Secondary | ICD-10-CM | POA: Insufficient documentation

## 2019-08-14 DIAGNOSIS — S8992XA Unspecified injury of left lower leg, initial encounter: Secondary | ICD-10-CM | POA: Diagnosis present

## 2019-08-14 DIAGNOSIS — Y999 Unspecified external cause status: Secondary | ICD-10-CM | POA: Insufficient documentation

## 2019-08-14 DIAGNOSIS — Y939 Activity, unspecified: Secondary | ICD-10-CM | POA: Insufficient documentation

## 2019-08-14 MED ORDER — LIDOCAINE HCL 2 % IJ SOLN
10.0000 mL | Freq: Once | INTRAMUSCULAR | Status: AC
  Administered 2019-08-14: 200 mg
  Filled 2019-08-14: qty 20

## 2019-08-14 NOTE — Discharge Instructions (Addendum)
Follow-up with your doctor or go to urgent care in 10 days to have the sutures removed.

## 2019-08-14 NOTE — ED Triage Notes (Signed)
Patient arrived via POV c/o left ankle laceration. Patient states she caught it on a wire cage and it "cut her". Patient has 1" laceration to poster left ankle. Patient is AO x 4, VS WDL, limping gait.

## 2019-08-14 NOTE — ED Notes (Signed)
Suture cart and lidocaine at bedside. Provider initiating procedure.

## 2019-08-14 NOTE — ED Provider Notes (Signed)
[facility] EMERGENCY DEPARTMENT Provider Note   CSN: [id number] Arrival date & time: 08/14/19  [id number]     History Chief Complaint  Patient presents with  . Extremity Laceration    Annette Greene is a 29 y.o. female.  Patient presents to the emergency department for evaluation of laceration in the back of her left lower leg.  Patient reports that she struck her leg on a piece of metal wire and it cut her.  She has an up-to-date tetanus status.        Past Medical History:  Diagnosis Date  . Herpes   . Hx of chlamydia infection   . Infection    UTI  . Preterm labor     Patient Active Problem List   Diagnosis Date Noted  . Hidradenitis suppurativa 12/19/2018  . Pancreatitis 10/19/2018  . Postpartum exam 09/07/2018  . SVD (spontaneous vaginal delivery) 07/21/2018  . Indication for care in labor or delivery 07/20/2018  . Human papilloma virus infection 06/02/2018  . Irregular periods 06/02/2018  . Abdominal pain affecting pregnancy 02/06/2018  . Obesity, morbid ([place]) 12/27/2017  . History of herpes simplex type 2 infection 12/27/2017  . Supervision of high risk pregnancy, antepartum 12/27/2017  . Premature delivery 12/10/2016  . Incompetent cervix in pregnancy, antepartum 11/24/2016  . Cyst of ovary 08/17/2016    Past Surgical History:  Procedure Laterality Date  . CERVICAL CERCLAGE N/A 01/25/2018   Procedure: CERCLAGE CERVICAL;  Surgeon: Everett Graff, MD;  Location: [facility];  Service: Gynecology;  Laterality: N/A;  . TONSILLECTOMY AND ADENOIDECTOMY    . WISDOM TOOTH EXTRACTION     all 4 teeth     OB History    Gravida  2   Para  2   Term  1   Preterm  1   AB      Living  2     SAB      TAB      Ectopic      Multiple  0   Live Births  2           Family History  Problem Relation Age of Onset  . Healthy Mother   . Hypertension Mother   . Healthy Father   . Healthy Sister   . Healthy Maternal Grandmother   .  Hypertension Maternal Grandmother   . Healthy Paternal Grandmother   . Healthy Paternal Grandfather   . Healthy Sister   . Drug abuse Maternal Aunt   . Hypertension Maternal Aunt     Social History   Tobacco Use  . Smoking status: Former Smoker    Packs/day: 0.50    Types: Cigarettes    Quit date: 06/24/2016    Years since quitting: 3.1  . Smokeless tobacco: Never Used  . Tobacco comment: stopped since pregnant  Substance Use Topics  . Alcohol use: Not Currently    Comment: stopped with pregnancy  . Drug use: Not Currently    Home Medications Prior to Admission medications   Medication Sig Start Date End Date Taking? Authorizing Provider  acetaminophen (TYLENOL) 500 MG tablet Take 500 mg by mouth every 6 (six) hours as needed for headache.    [provider]  clindamycin (CLEOCIN-T) 1 % external solution Apply to affected area 2 times daily Patient not taking: Reported on 01/24/2019 01/10/19 01/10/20  Masoudi, Dorthula Rue, MD  HYDROcodone-acetaminophen (NORCO/VICODIN) 5-325 MG tablet Take 1 tablet by mouth every 6 (six) hours as needed. Patient not  taking: Reported on 01/24/2019 10/08/18   Ripley Fraise, MD  naproxen (NAPROSYN) 500 MG tablet Take 1 tablet (500 mg total) by mouth 2 (two) times daily. Patient not taking: Reported on 01/24/2019 08/27/18   Petrucelli, Samantha R, PA-C  ondansetron (ZOFRAN) 4 MG tablet Take 1 tablet (4 mg total) by mouth daily as needed for nausea or vomiting. Patient not taking: Reported on 01/24/2019 10/17/18 10/17/19  Masoudi, Dorthula Rue, MD  Prenatal Vit-Fe Fumarate-FA (MULTIVITAMIN-PRENATAL) 27-0.8 MG TABS tablet Take 1 tablet by mouth daily.    [provider]  senna-docusate (SENOKOT-S) 8.6-50 MG tablet Take 2 tablets by mouth daily. Patient not taking: Reported on 07/24/2018 07/23/18   Nicolette Bang, DO    Allergies    Patient has no known allergies.  Review of Systems   Review of Systems  Skin: Positive for  wound.  Neurological: Negative.     Physical Exam Updated Vital Signs BP 134/78 (BP Location: Right Arm)   Pulse 69   Temp 97.9 F (36.6 C) (Oral)   Resp 18   Ht 5\' 7"  (1.702 m)   Wt 115.2 kg   LMP 08/13/2019 (Exact Date)   SpO2 100%   BMI 39.78 kg/m   Physical Exam Vitals and nursing note reviewed.  Constitutional:      Appearance: Normal appearance.  HENT:     Head: Atraumatic.  Musculoskeletal:     Left lower leg: Laceration (posterior) present.  Skin:    Findings: Laceration present.  Neurological:     Mental Status: She is alert.     Sensory: Sensation is intact.     Motor: Motor function is intact.     ED Results / Procedures / Treatments   Labs (all labs ordered are listed, but only abnormal results are displayed) Labs Reviewed - No data to display  EKG None  Radiology No results found.  Procedures .Marland KitchenLaceration Repair  Date/Time: 08/14/2019 6:09 AM Performed by: Orpah Greek, MD Authorized by: Orpah Greek, MD   Consent:    Consent obtained:  Verbal   Consent given by:  Patient   Risks discussed:  Infection, pain and poor cosmetic result Universal protocol:    Procedure explained and questions answered to patient or proxy's satisfaction: yes     Site/side marked: yes     Immediately prior to procedure, a time out was called: yes     Patient identity confirmed:  Verbally with patient Anesthesia (see MAR for exact dosages):    Anesthesia method:  Local infiltration   Local anesthetic:  Lidocaine 2% w/o epi Laceration details:    Location:  Leg   Leg location:  L lower leg   Length (cm):  2 Repair type:    Repair type:  Simple Pre-procedure details:    Preparation:  Patient was prepped and draped in usual sterile fashion Exploration:    Hemostasis achieved with:  Direct pressure   Wound exploration: wound explored through full range of motion     Contaminated: no   Treatment:    Area cleansed with:  Betadine    Irrigation solution:  Sterile saline   Irrigation method:  Syringe Skin repair:    Repair method:  Sutures   Suture size:  3-0   Suture material:  Prolene   Number of sutures:  3 Approximation:    Approximation:  Close Post-procedure details:    Patient tolerance of procedure:  Tolerated well, no immediate complications   (including critical care time)  Medications Ordered  in ED Medications  lidocaine (XYLOCAINE) 2 % (with pres) injection 200 mg (has no administration in time range)    ED Course  I have reviewed the triage vital signs and the nursing notes.  Pertinent labs & imaging results that were available during my care of the patient were reviewed by me and considered in my medical decision making (see chart for details).    MDM Rules/Calculators/A&P                     Patient with superficial laceration over the posterior left lower leg.  Wound does lie over the region of the Achilles tendon but is superficial and no concern for tendon involvement.  Tetanus is up-to-date.  Wound sutured, will need suture removal in 10 days. Final Clinical Impression(s) / ED Diagnoses Final diagnoses:  Laceration of left lower extremity, initial encounter    Rx / DC Orders ED Discharge Orders    None       Meia Emley, Gwenyth Allegra, MD 08/14/19 [phone]

## 2019-09-07 ENCOUNTER — Ambulatory Visit
Admission: EM | Admit: 2019-09-07 | Discharge: 2019-09-07 | Disposition: A | Discharge status: Home / Self Care | Payer: Medicaid Other | Attending: Emergency Medicine | Admitting: Emergency Medicine

## 2019-09-07 ENCOUNTER — Other Ambulatory Visit: Payer: Self-pay

## 2019-09-07 ENCOUNTER — Ambulatory Visit (HOSPITAL_COMMUNITY)
Admission: EM | Admit: 2019-09-07 | Discharge: 2019-09-07 | Discharge status: Against Medical Advice | Payer: Medicaid Other

## 2019-09-07 DIAGNOSIS — Z4802 Encounter for removal of sutures: Secondary | ICD-10-CM

## 2019-09-07 NOTE — ED Notes (Signed)
Pt states she removed two of her three sutures PTA. Wound well healed w/o s/s infection. Final suture removed.

## 2019-10-02 IMAGING — US US OB < 14 WEEKS - US OB TV
1 series · 15 of 28 positions shown · non-contrast
Comparison: 12/07/2016.

CLINICAL DATA: Cramping affecting pregnancy.

EXAM:
OBSTETRIC <14 WK US AND TRANSVAGINAL OB US
TECHNIQUE: Both transabdominal and transvaginal ultrasound examinations were
performed for complete evaluation of the gestation as well as the
maternal uterus, adnexal regions, and pelvic cul-de-sac.
Transvaginal technique was performed to assess early pregnancy.

[Series 1: us ob < 14 weeks - us ob tv · 15 of 78 slices shown]
[im 1/78]
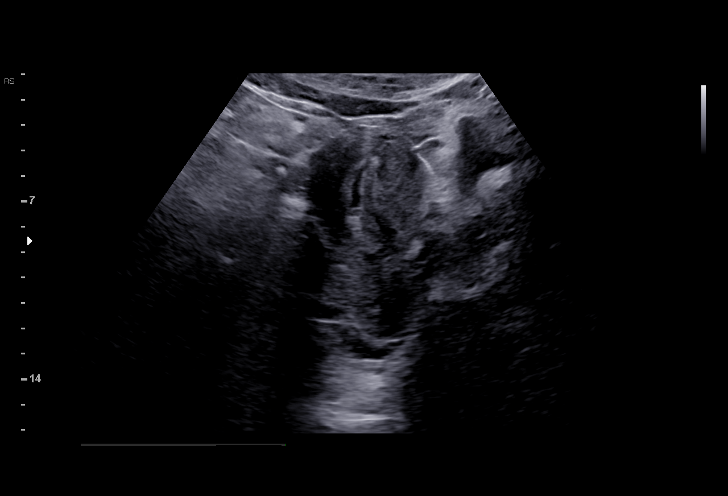
[im 6/78]
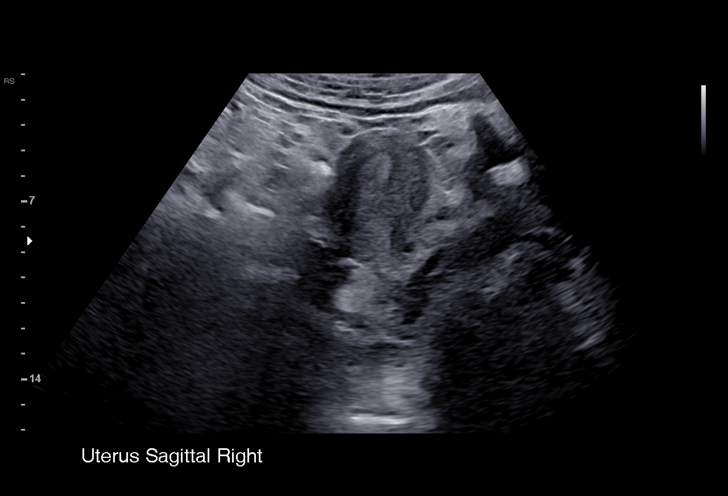
[im 12/78]
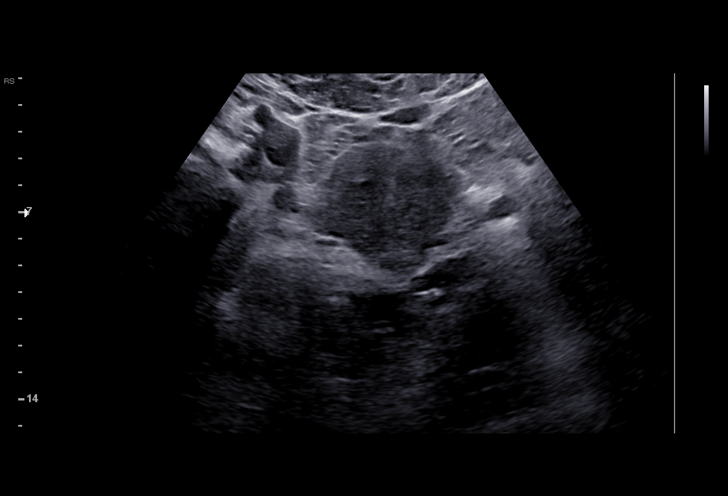
[im 18/78]
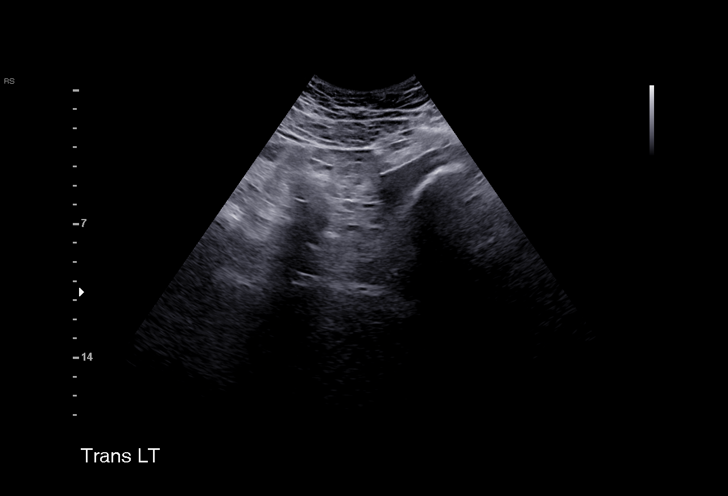
[im 23/78]
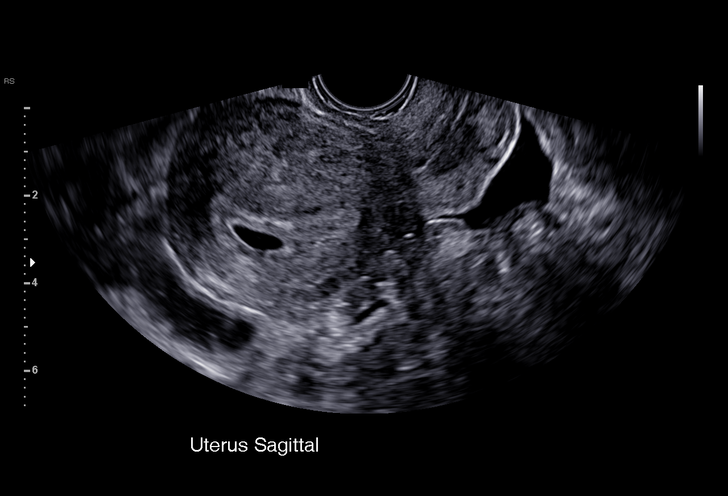
[im 29/78]
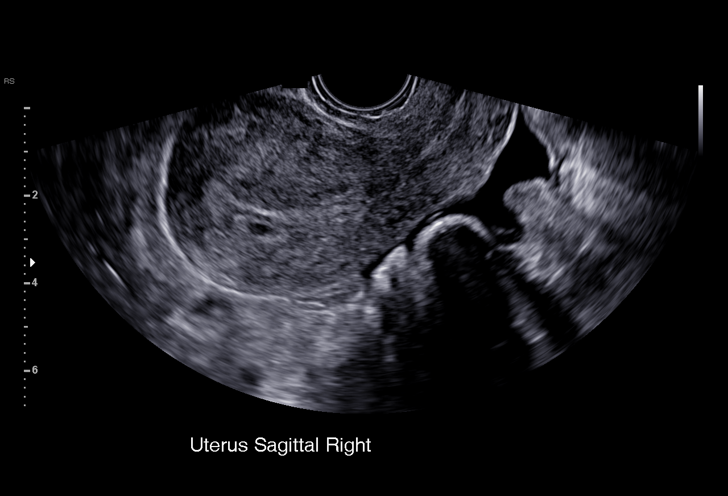
[im 35/78]
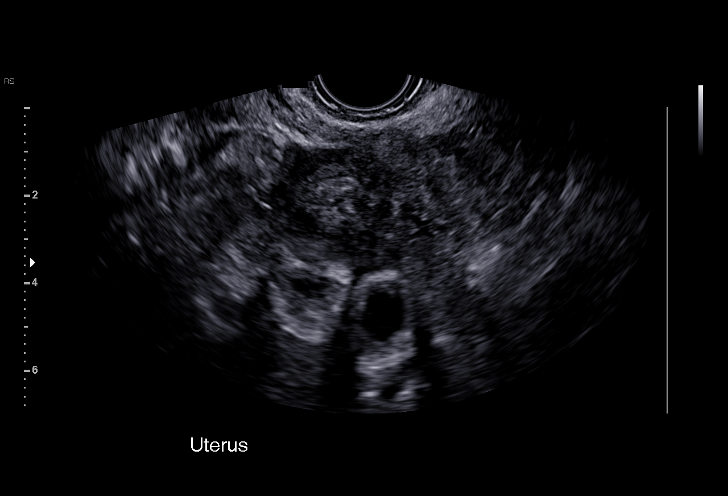
[im 40/78]
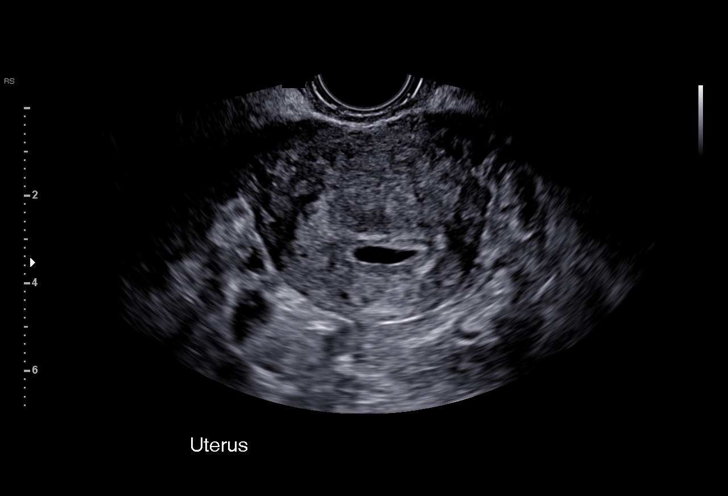
[im 43/78]
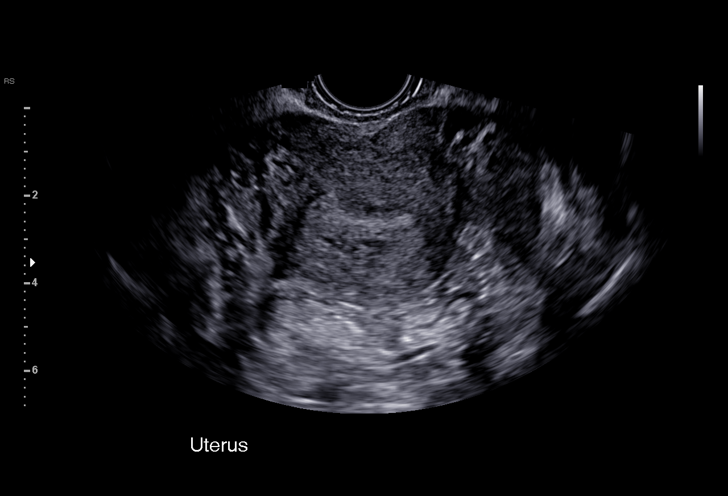
[im 49/78]
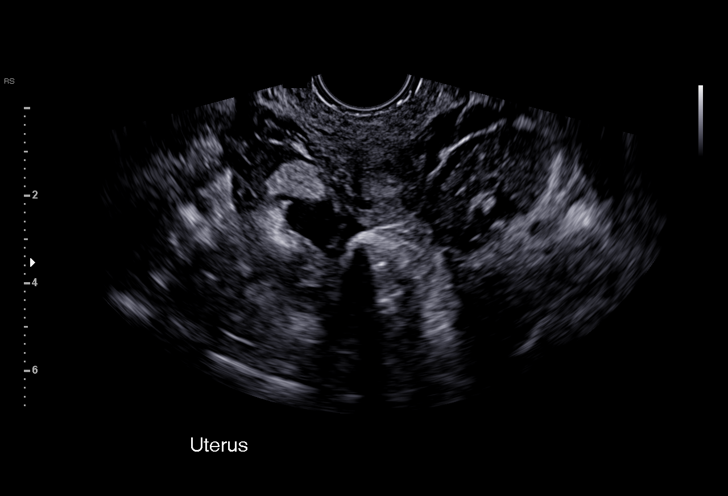
[im 55/78]
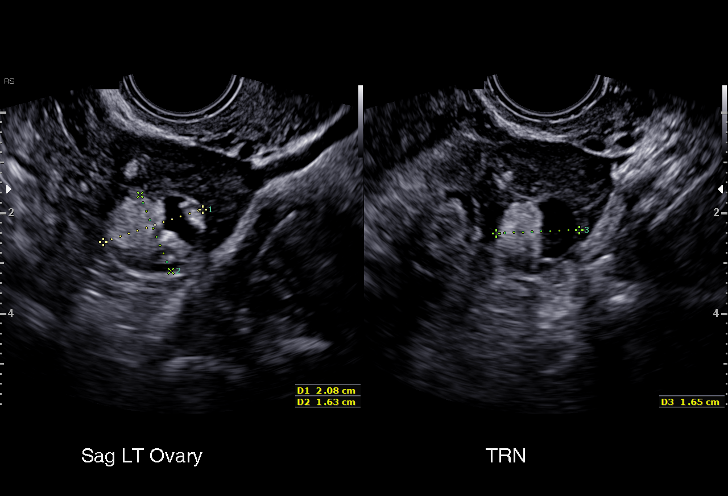
[im 60/78]
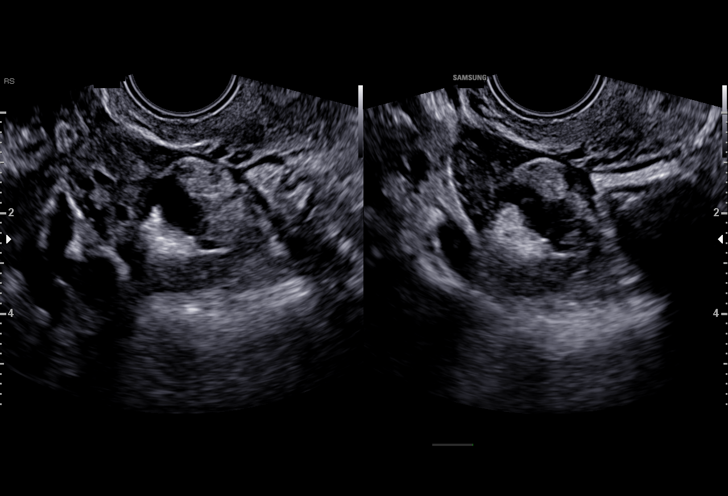
[im 66/78]
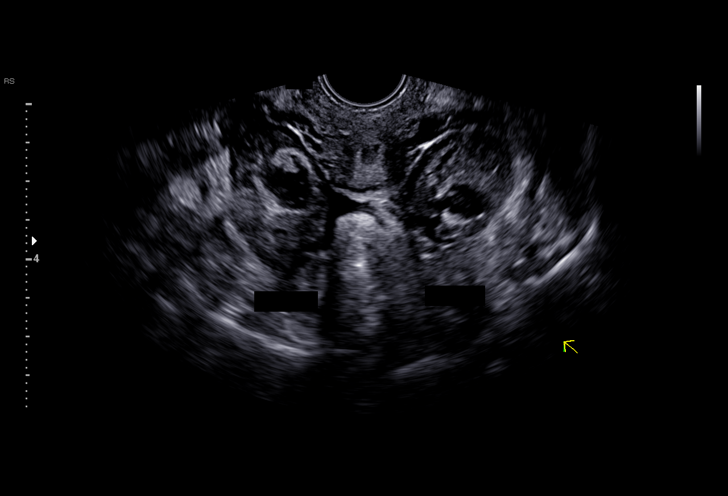
[im 72/78]
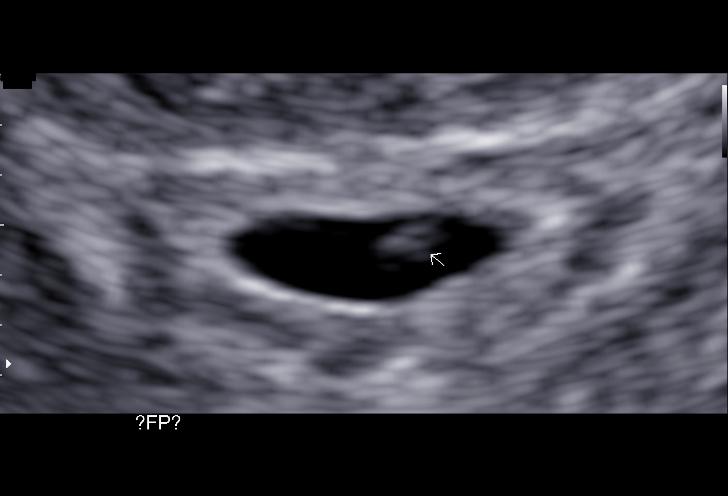
[im 78/78]
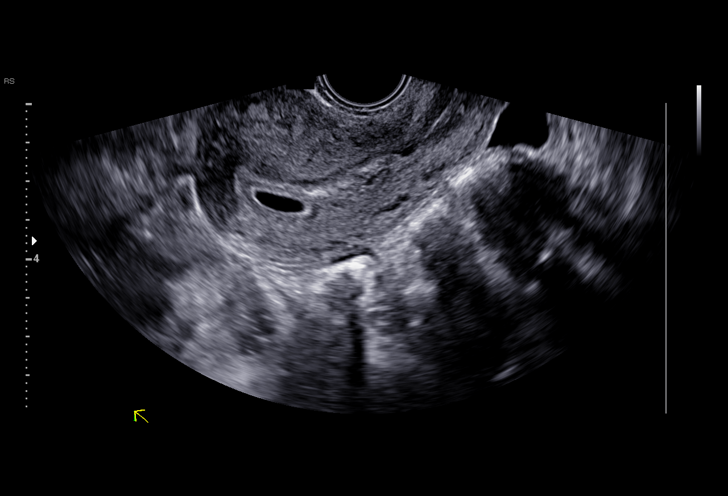

[15 of 28 positions shown; findings below may reference images not displayed]

FINDINGS: Intrauterine gestational sac: Single

Yolk sac:  Not well visualized

Embryo:  Not well visualized

Cardiac Activity: Not visualized

MSD: 9.7 mm   5 w   5 d

Subchorionic hemorrhage:  None visualized.

Maternal uterus/adnexae: 2.6 cm complex hyperechoic and cystic mass
noted in the right ovary. 2.1 cm hyperechoic and complex cystic mass
noted in the left ovary. These could represent bilateral dermoids.
Small amount of free pelvic fluid.
IMPRESSION: 1. Intrauterine gestational sac with mean sac diameter of 9.7 mm car
spine to 5 weeks 5 days. Yolk sac and embryo not well visualized.
Probable early intrauterine gestational sac, but no cardiac activity
yet visualized. Recommend follow-up quantitative B-HCG levels and
follow-up US in 14 days to assess viability. This recommendation
follows SRU consensus guidelines: Diagnostic Criteria for Nonviable
Pregnancy Early in the First Trimester. N Engl J Med 6915;
[DATE]

2. 2.6 cm complex. Hyperechoic and cystic mass right ovary. 2.1 cm
hyperechoic and complex cystic mass left ovary. These could
represent bilateral dermoids. Small amount of free pelvic fluid.

## 2019-10-22 ENCOUNTER — Encounter: Payer: Medicaid Other | Admitting: Internal Medicine

## 2019-10-22 ENCOUNTER — Telehealth: Payer: Self-pay | Admitting: *Deleted

## 2019-10-22 NOTE — Progress Notes (Deleted)
   CC: ***  HPI:  Ms.Kalyssa N Kinter is a 29 y.o. female with PMHx as documented below, presented with ***. Please refer to problem based charting for further details and assessment and plan of current problem and chronic medical conditions.  PMHx: HPV Herpes simplex 2 infection Obesity Pancreatitis Hydradenitis   Past Medical History:  Diagnosis Date  . Herpes   . Hx of chlamydia infection   . Infection    UTI  . Preterm labor    Review of Systems:  ***  Physical Exam:  There were no vitals filed for this visit. ***  Assessment & Plan:   See Encounters Tab for problem based charting.  Patient {GC/GE:[id number]::"discussed with","seen with"} Dr. {NAMES:[id number]::"Butcher","Guilloud","Hoffman","Mullen","Narendra","Raines","Vincent"}    Thank you for allowing [place] to provide your care today. Today we discussed ***    I have ordered *** labs for you. I will call if any are abnormal.    Today we made *** changes to your medications.    Please come back to clinic in **** or earlier if your symptoms get worse or not improved. As always, if having severe symptoms, please seek medical attention at emergency room. Should you have any questions or concerns please call the internal medicine clinic at [phone].    Thank you!

## 2019-10-22 NOTE — Telephone Encounter (Signed)
Pt did not come to her appt this afternoon. Called pt - stated she forgot; got off work this morning. Appt re-scheduled Thursday @ 1415 PM.

## 2019-10-25 ENCOUNTER — Encounter: Payer: Medicaid Other | Admitting: Internal Medicine

## 2019-10-25 ENCOUNTER — Telehealth: Payer: Self-pay

## 2019-10-25 NOTE — Progress Notes (Deleted)
   CC: ***  HPI:  [name] is a 29 y.o. female with PMHx as documented below, presented with ***. Please refer to problem based charting for further details and assessment and plan of current problem and chronic medical conditions.  Past medical history: Pancreatitis, ovarian cyst, hidradenitis suppurativa, obesity, history of herpes simplex type II  Medications: Tylenol, clindamycin external solution, Norco?,  Naproxen   Past Medical History:  Diagnosis Date  . Herpes   . Hx of chlamydia infection   . Infection    UTI  . Preterm labor    Review of Systems:  ***  Physical Exam:  There were no vitals filed for this visit. ***  Assessment & Plan:   See Encounters Tab for problem based charting.  Patient {GC/GE:[id number]::"discussed with","seen with"} Dr. {NAMES:[id number]::"Butcher","Guilloud","Hoffman","Mullen","Narendra","Raines","Vincent"}

## 2019-11-08 ENCOUNTER — Encounter (HOSPITAL_BASED_OUTPATIENT_CLINIC_OR_DEPARTMENT_OTHER): Payer: Self-pay | Admitting: Emergency Medicine

## 2019-11-08 ENCOUNTER — Other Ambulatory Visit: Payer: Self-pay

## 2019-11-08 ENCOUNTER — Emergency Department (HOSPITAL_BASED_OUTPATIENT_CLINIC_OR_DEPARTMENT_OTHER)
Admission: EM | Admit: 2019-11-08 | Discharge: 2019-11-08 | Disposition: A | Discharge status: Home / Self Care | Payer: Medicaid Other | Attending: Emergency Medicine | Admitting: Emergency Medicine

## 2019-11-08 DIAGNOSIS — Z87891 Personal history of nicotine dependence: Secondary | ICD-10-CM | POA: Insufficient documentation

## 2019-11-08 DIAGNOSIS — M533 Sacrococcygeal disorders, not elsewhere classified: Secondary | ICD-10-CM | POA: Diagnosis not present

## 2019-11-08 DIAGNOSIS — M549 Dorsalgia, unspecified: Secondary | ICD-10-CM | POA: Diagnosis present

## 2019-11-08 LAB — URINALYSIS, ROUTINE W REFLEX MICROSCOPIC
Bilirubin Urine: NEGATIVE
Glucose, UA: NEGATIVE mg/dL
Ketones, ur: NEGATIVE mg/dL
Leukocytes,Ua: NEGATIVE
Nitrite: NEGATIVE
Protein, ur: NEGATIVE mg/dL
Specific Gravity, Urine: >1.03 — ABNORMAL HIGH (ref 1.005–1.030)
pH: 5.5 (ref 5.0–8.0)

## 2019-11-08 LAB — URINALYSIS, MICROSCOPIC (REFLEX)

## 2019-11-08 LAB — PREGNANCY, URINE: Preg Test, Ur: NEGATIVE

## 2019-11-08 MED ORDER — NAPROXEN 500 MG PO TABS: ORAL_TABLET | ORAL | 0 refills | Status: DC

## 2019-11-08 MED ORDER — NAPROXEN 250 MG PO TABS
500.0000 mg | ORAL_TABLET | Freq: Once | ORAL | Status: AC
  Administered 2019-11-08: 500 mg via ORAL
  Filled 2019-11-08: qty 2

## 2019-11-08 NOTE — ED Notes (Signed)
Triage done by Avie Echevaria, RN not Sabino Gasser, EMT

## 2019-11-08 NOTE — ED Provider Notes (Signed)
[facility] Provider Note: Annette Spurling, MD, FACEP  CSN: [id number] MRN: [id number] ARRIVAL: 11/08/19 at [place]: [place]  Back Pain   HISTORY OF PRESENT ILLNESS  11/08/19 4:27 AM Annette Greene is a 29 y.o. female with lower back pain for about a week. She describes it as aching or like "a ball" and rates it as a 6 out of 10. It is at the base of her spine and radiating upwards. It is worse with movement and relieved with pressure. It is similar to pain she has had postpartum in the past. She has taken occasional Advil without relief.  She has also been having lower abdominal pain for 2 days. She describes this as feeling like a pressure, not severe. She denies dysuria, hematuria, vaginal bleeding or vaginal discharge.   Past Medical History:  Diagnosis Date  . Herpes   . Hx of chlamydia infection   . Infection    UTI  . Preterm labor     Past Surgical History:  Procedure Laterality Date  . CERVICAL CERCLAGE N/A 01/25/2018   Procedure: CERCLAGE CERVICAL;  Surgeon: Everett Graff, MD;  Location: [facility];  Service: Gynecology;  Laterality: N/A;  . TONSILLECTOMY AND ADENOIDECTOMY    . WISDOM TOOTH EXTRACTION     all 4 teeth    Family History  Problem Relation Age of Onset  . Healthy Mother   . Hypertension Mother   . Healthy Father   . Healthy Sister   . Healthy Maternal Grandmother   . Hypertension Maternal Grandmother   . Healthy Paternal Grandmother   . Healthy Paternal Grandfather   . Healthy Sister   . Drug abuse Maternal Aunt   . Hypertension Maternal Aunt     Social History   Tobacco Use  . Smoking status: Former Smoker    Packs/day: 0.50    Types: Cigarettes    Quit date: 06/24/2016    Years since quitting: 3.3  . Smokeless tobacco: Never Used  . Tobacco comment: stopped since pregnant  Vaping Use  . Vaping Use: Never used  Substance Use Topics  . Alcohol use: Not Currently    Comment: stopped with  pregnancy  . Drug use: Not Currently    Prior to Admission medications   Medication Sig Start Date End Date Taking? Authorizing Provider  naproxen (NAPROSYN) 500 MG tablet Take 1 tablet twice daily as needed for tailbone pain. 11/08/19   Annette Greene, Jenny Reichmann, MD    Allergies Patient has no known allergies.   REVIEW OF SYSTEMS  Negative except as noted here or in the History of Present Illness.   PHYSICAL EXAMINATION  Initial Vital Signs Blood pressure 129/62, pulse 62, temperature 98.1 F (36.7 C), temperature source Oral, resp. rate 16, height 5\' 7"  (1.702 m), weight 114.8 kg, last menstrual period 10/03/2019, SpO2 100 %, currently breastfeeding.  Examination General: Well-developed, well-nourished female in no acute distress; appearance consistent with age of record HENT: normocephalic; atraumatic Eyes: Normal appearance Neck: supple Heart: regular rate and rhythm Lungs: clear to auscultation bilaterally Abdomen: soft; nondistended; suprapubic palpation reproduces sensation of pressure; bowel sounds present Back: Mild upper coccygeal tenderness Extremities: No deformity; full range of motion; pulses normal Neurologic: Awake, alert and oriented; motor function intact in all extremities and symmetric; no facial droop Skin: Warm and dry Psychiatric: Normal mood and affect   RESULTS  Summary of this visit's results, reviewed and interpreted by myself:   EKG Interpretation  Date/Time:  Ventricular Rate:    PR Interval:    QRS Duration:   QT Interval:    QTC Calculation:   R Axis:     Text Interpretation:        Laboratory Studies: Results for orders placed or performed during the hospital encounter of 11/08/19 (from the past 24 hour(s))  Urinalysis, Routine w reflex microscopic     Status: Abnormal   Collection Time: 11/08/19  4:06 AM  Result Value Ref Range   Color, Urine YELLOW YELLOW   APPearance CLEAR CLEAR   Specific Gravity, Urine >1.030 (H) 1.005 - 1.030   pH  5.5 5.0 - 8.0   Glucose, UA NEGATIVE NEGATIVE mg/dL   Hgb urine dipstick TRACE (A) NEGATIVE   Bilirubin Urine NEGATIVE NEGATIVE   Ketones, ur NEGATIVE NEGATIVE mg/dL   Protein, ur NEGATIVE NEGATIVE mg/dL   Nitrite NEGATIVE NEGATIVE   Leukocytes,Ua NEGATIVE NEGATIVE  Pregnancy, urine     Status: None   Collection Time: 11/08/19  4:06 AM  Result Value Ref Range   Preg Test, Ur NEGATIVE NEGATIVE  Urinalysis, Microscopic (reflex)     Status: Abnormal   Collection Time: 11/08/19  4:06 AM  Result Value Ref Range   RBC / HPF 0-5 0 - 5 RBC/hpf   WBC, UA 0-5 0 - 5 WBC/hpf   Bacteria, UA FEW (A) NONE SEEN   Squamous Epithelial / LPF 0-5 0 - 5   Mucus PRESENT    Imaging Studies: No results found.  ED COURSE and MDM  Nursing notes, initial and subsequent vitals signs, including pulse oximetry, reviewed and interpreted by myself.  Vitals:   11/08/19 0358 11/08/19 0405 11/08/19 0436  BP:  129/62 (!) 128/108  Pulse:  62 78  Resp:  16 18  Temp:  98.1 F (36.7 C)   TempSrc:  Oral   SpO2:  100% 100%  Weight: 114.8 kg    Height: 5\' 7"  (1.702 m)     Medications  naproxen (NAPROSYN) tablet 500 mg (500 mg Oral Given 11/08/19 0440)   Presentation consistent with coccydynia.  We will treat with naproxen.   PROCEDURES  Procedures   ED DIAGNOSES     ICD-10-CM   1. Coccydynia  M53.3        Cailen Texeira, Jenny Reichmann, MD 11/08/19 [phone]

## 2019-11-08 NOTE — ED Triage Notes (Signed)
Pt states she has been having lower back pain for about a week  Pt states the pain is at the bottom of her spine and it is making her whole back hurt  States it feels like a ball  Pt also states she has been having lower abd pain x 2 days  States it feels like a lot of pressure

## 2019-11-09 LAB — GC/CHLAMYDIA PROBE AMP (~~LOC~~) NOT AT ARMC
Chlamydia: NEGATIVE
Comment: NEGATIVE
Comment: NORMAL
Neisseria Gonorrhea: NEGATIVE

## 2019-11-18 ENCOUNTER — Encounter (HOSPITAL_BASED_OUTPATIENT_CLINIC_OR_DEPARTMENT_OTHER): Payer: Self-pay | Admitting: *Deleted

## 2019-11-18 ENCOUNTER — Emergency Department (HOSPITAL_BASED_OUTPATIENT_CLINIC_OR_DEPARTMENT_OTHER)
Admission: EM | Admit: 2019-11-18 | Discharge: 2019-11-18 | Disposition: A | Discharge status: Home / Self Care | Payer: Medicaid Other | Attending: Emergency Medicine | Admitting: Emergency Medicine

## 2019-11-18 ENCOUNTER — Other Ambulatory Visit: Payer: Self-pay

## 2019-11-18 DIAGNOSIS — Z3A08 8 weeks gestation of pregnancy: Secondary | ICD-10-CM | POA: Diagnosis not present

## 2019-11-18 DIAGNOSIS — Z3A01 Less than 8 weeks gestation of pregnancy: Secondary | ICD-10-CM

## 2019-11-18 DIAGNOSIS — Z3201 Encounter for pregnancy test, result positive: Secondary | ICD-10-CM | POA: Insufficient documentation

## 2019-11-18 DIAGNOSIS — Z79899 Other long term (current) drug therapy: Secondary | ICD-10-CM | POA: Insufficient documentation

## 2019-11-18 DIAGNOSIS — Z87891 Personal history of nicotine dependence: Secondary | ICD-10-CM | POA: Diagnosis not present

## 2019-11-18 DIAGNOSIS — Z3200 Encounter for pregnancy test, result unknown: Secondary | ICD-10-CM | POA: Diagnosis present

## 2019-11-18 LAB — HCG, SERUM, QUALITATIVE: Preg, Serum: POSITIVE — AB

## 2019-11-18 NOTE — ED Notes (Signed)
Attempted to straight stick twice, unsuccessfully; pt tolerated well, said she is a hard stick and they sometimes use [place]

## 2019-11-18 NOTE — ED Provider Notes (Signed)
[facility] EMERGENCY DEPARTMENT Provider Note   CSN: [id number] Arrival date & time: 11/18/19  0258     History Chief Complaint  Patient presents with  . positive preg test    Annette Greene is a 29 y.o. female.  The history is provided by the patient.  Possible Pregnancy This is a new problem. The current episode started more than 1 week ago (15 days late on her period and wants a blood pregnancy test.  ). The problem occurs constantly. The problem has not changed since onset.Pertinent negatives include no chest pain, no abdominal pain and no shortness of breath. Nothing aggravates the symptoms. Nothing relieves the symptoms. She has tried nothing for the symptoms. The treatment provided no relief.  Patient with LMP on time on 7/14.  Non in August, was regular and on time before than.  Denies pain, bleeding or discharge. No n/v/d.  Wants a confirmatory test.      Past Medical History:  Diagnosis Date  . Herpes   . Hx of chlamydia infection   . Infection    UTI  . Preterm labor     Patient Active Problem List   Diagnosis Date Noted  . Hidradenitis suppurativa 12/19/2018  . Pancreatitis 10/19/2018  . Postpartum exam 09/07/2018  . SVD (spontaneous vaginal delivery) 07/21/2018  . Indication for care in labor or delivery 07/20/2018  . Human papilloma virus infection 06/02/2018  . Irregular periods 06/02/2018  . Abdominal pain affecting pregnancy 02/06/2018  . Obesity, morbid ([place]) 12/27/2017  . History of herpes simplex type 2 infection 12/27/2017  . Supervision of high risk pregnancy, antepartum 12/27/2017  . Premature delivery 12/10/2016  . Incompetent cervix in pregnancy, antepartum 11/24/2016  . Cyst of ovary 08/17/2016    Past Surgical History:  Procedure Laterality Date  . CERVICAL CERCLAGE N/A 01/25/2018   Procedure: CERCLAGE CERVICAL;  Surgeon: Everett Graff, MD;  Location: [facility];  Service: Gynecology;  Laterality: N/A;  .  TONSILLECTOMY AND ADENOIDECTOMY    . WISDOM TOOTH EXTRACTION     all 4 teeth     OB History    Gravida  2   Para  2   Term  1   Preterm  1   AB      Living  2     SAB      TAB      Ectopic      Multiple  0   Live Births  2           Family History  Problem Relation Age of Onset  . Healthy Mother   . Hypertension Mother   . Healthy Father   . Healthy Sister   . Healthy Maternal Grandmother   . Hypertension Maternal Grandmother   . Healthy Paternal Grandmother   . Healthy Paternal Grandfather   . Healthy Sister   . Drug abuse Maternal Aunt   . Hypertension Maternal Aunt     Social History   Tobacco Use  . Smoking status: Former Smoker    Packs/day: 0.50    Types: Cigarettes    Quit date: 06/24/2016    Years since quitting: 3.4  . Smokeless tobacco: Never Used  . Tobacco comment: stopped since pregnant  Vaping Use  . Vaping Use: Never used  Substance Use Topics  . Alcohol use: Not Currently    Comment: stopped with pregnancy  . Drug use: Not Currently    Home Medications Prior to Admission medications  Medication Sig Start Date End Date Taking? Authorizing Provider  esomeprazole (NEXIUM) 40 MG capsule Take 40 mg by mouth daily at 12 noon.   Yes [provider]  naproxen (NAPROSYN) 500 MG tablet Take 1 tablet twice daily as needed for tailbone pain. 11/08/19   Molpus, Jenny Reichmann, MD    Allergies    Patient has no known allergies.  Review of Systems   Review of Systems  Constitutional: Negative for fever.  HENT: Negative for congestion.   Eyes: Negative for visual disturbance.  Respiratory: Negative for shortness of breath.   Cardiovascular: Negative for chest pain.  Gastrointestinal: Negative for abdominal pain, nausea and vomiting.  Genitourinary: Negative for dysuria, pelvic pain, vaginal bleeding and vaginal discharge.  Musculoskeletal: Negative for arthralgias.  Neurological: Negative for dizziness.  Psychiatric/Behavioral:  Negative for agitation.  All other systems reviewed and are negative.   Physical Exam Updated Vital Signs BP 116/63   Temp 99 F (37.2 C)   Resp 18   Ht 5\' 7"  (1.702 m)   Wt 114.8 kg   LMP 10/03/2019 (Exact Date)   SpO2 100%   BMI 39.63 kg/m   Physical Exam Vitals and nursing note reviewed.  Constitutional:      General: She is not in acute distress.    Appearance: Normal appearance.  HENT:     Head: Normocephalic and atraumatic.     Nose: Nose normal.  Eyes:     Conjunctiva/sclera: Conjunctivae normal.     Pupils: Pupils are equal, round, and reactive to light.  Cardiovascular:     Rate and Rhythm: Normal rate and regular rhythm.     Pulses: Normal pulses.     Heart sounds: Normal heart sounds.  Pulmonary:     Effort: Pulmonary effort is normal.     Breath sounds: Normal breath sounds.  Abdominal:     General: Abdomen is flat. Bowel sounds are normal.     Palpations: Abdomen is soft.     Tenderness: There is no abdominal tenderness. There is no guarding or rebound.  Musculoskeletal:        General: Normal range of motion.     Cervical back: Normal range of motion and neck supple.  Skin:    General: Skin is warm.     Capillary Refill: Capillary refill takes less than 2 seconds.  Neurological:     General: No focal deficit present.     Mental Status: She is alert and oriented to person, place, and time.  Psychiatric:        Mood and Affect: Mood normal.        Behavior: Behavior normal.     ED Results / Procedures / Treatments   Labs (all labs ordered are listed, but only abnormal results are displayed) Labs Reviewed  HCG, SERUM, QUALITATIVE    EKG None  Radiology No results found.  Procedures Procedures (including critical care time)  Medications Ordered in ED Medications - No data to display  ED Course  I have reviewed the triage vital signs and the nursing notes.  Pertinent labs & imaging results that were available during my care of the  patient were reviewed by me and considered in my medical decision making (see chart for details).    Wanted a blood pregnancy test because those are better.  This was ordered.  Patient is pregnant.  No other symptoms.  Follow up with your PMD.    Annette Greene was evaluated in Emergency Department on 11/18/2019 for  the symptoms described in the history of present illness. She was evaluated in the context of the global COVID-19 pandemic, which necessitated consideration that the patient might be at risk for infection with the SARS-CoV-2 virus that causes COVID-19. Institutional protocols and algorithms that pertain to the evaluation of patients at risk for COVID-19 are in a state of rapid change based on information released by regulatory bodies including the CDC and federal and state organizations. These policies and algorithms were followed during the patient's care in the ED.   Final Clinical Impression(s) / ED Diagnoses Return for intractable cough, coughing up blood,fevers >100.4 unrelieved by medication, shortness of breath, intractable vomiting, chest pain, shortness of breath, weakness,numbness, changes in speech, facial asymmetry,abdominal pain, passing out,Inability to tolerate liquids or food, cough, altered mental status or any concerns. No signs of systemic illness or infection. The patient is nontoxic-appearing on exam and vital signs are within normal limits.   I have reviewed the triage vital signs and the nursing notes. Pertinent labs &imaging results that were available during my care of the patient were reviewed by me and considered in my medical decision making (see chart for details).After history, exam, and medical workup I feel the patient has beenappropriately medically screened and is safe for discharge home. Pertinent diagnoses were discussed with the patient. Patient was given return precautions.      Raquelle Pietro, MD 11/18/19 5825

## 2019-11-18 NOTE — ED Triage Notes (Signed)
Pt states she is here for a "blood" pregnancy test. States she prefers not to have a urine pregnancy test done. No other symptoms. 15 days late.

## 2019-11-27 DIAGNOSIS — Z20822 Contact with and (suspected) exposure to covid-19: Secondary | ICD-10-CM | POA: Diagnosis not present

## 2019-12-10 DIAGNOSIS — Z3491 Encounter for supervision of normal pregnancy, unspecified, first trimester: Secondary | ICD-10-CM | POA: Diagnosis not present

## 2019-12-10 DIAGNOSIS — N883 Incompetence of cervix uteri: Secondary | ICD-10-CM | POA: Diagnosis not present

## 2019-12-10 DIAGNOSIS — O3680X1 Pregnancy with inconclusive fetal viability, fetus 1: Secondary | ICD-10-CM | POA: Diagnosis not present

## 2019-12-10 LAB — OB RESULTS CONSOLE RUBELLA ANTIBODY, IGM: Rubella: IMMUNE

## 2019-12-10 LAB — OB RESULTS CONSOLE HIV ANTIBODY (ROUTINE TESTING): HIV: NONREACTIVE

## 2019-12-10 LAB — OB RESULTS CONSOLE HEPATITIS B SURFACE ANTIGEN: Hepatitis B Surface Ag: NEGATIVE

## 2019-12-10 LAB — OB RESULTS CONSOLE ABO/RH: RH Type: POSITIVE

## 2019-12-10 LAB — HEPATITIS C ANTIBODY: HCV Ab: NEGATIVE

## 2019-12-10 LAB — OB RESULTS CONSOLE ANTIBODY SCREEN: Antibody Screen: NEGATIVE

## 2019-12-10 LAB — OB RESULTS CONSOLE GC/CHLAMYDIA: Gonorrhea: NEGATIVE

## 2019-12-10 LAB — OB RESULTS CONSOLE RPR: RPR: NONREACTIVE

## 2019-12-10 NOTE — Telephone Encounter (Signed)
n

## 2019-12-11 DIAGNOSIS — Z3491 Encounter for supervision of normal pregnancy, unspecified, first trimester: Secondary | ICD-10-CM | POA: Diagnosis not present

## 2019-12-13 DIAGNOSIS — A5901 Trichomonal vulvovaginitis: Secondary | ICD-10-CM | POA: Insufficient documentation

## 2019-12-13 HISTORY — DX: Trichomonal vulvovaginitis: A59.01

## 2019-12-13 LAB — HEMOGLOBIN A1C: Hemoglobin A1C: 5.4

## 2019-12-20 ENCOUNTER — Telehealth: Payer: Medicaid Other | Admitting: *Deleted

## 2019-12-20 ENCOUNTER — Other Ambulatory Visit: Payer: Self-pay

## 2019-12-20 DIAGNOSIS — O0990 Supervision of high risk pregnancy, unspecified, unspecified trimester: Secondary | ICD-10-CM | POA: Insufficient documentation

## 2019-12-20 NOTE — Progress Notes (Signed)
1:30 Patient not connected via MyChart for virtual visit. I called Annette Greene and confirmed her identity with 2 identifiers. I informed her I am calling for her virtual visit for new ob intake and asked if she would like to complete her visit today. She informed me she " completely forgot " and that she unfortunately can't do the visit because she has to go pick up her kids. I informed her she can call the office to reschedule and she voices understanding. Kimothy Kishimoto,RN

## 2019-12-24 DIAGNOSIS — O3680X1 Pregnancy with inconclusive fetal viability, fetus 1: Secondary | ICD-10-CM | POA: Diagnosis not present

## 2019-12-24 DIAGNOSIS — Z3491 Encounter for supervision of normal pregnancy, unspecified, first trimester: Secondary | ICD-10-CM | POA: Diagnosis not present

## 2019-12-25 ENCOUNTER — Emergency Department (HOSPITAL_BASED_OUTPATIENT_CLINIC_OR_DEPARTMENT_OTHER)
Admission: EM | Admit: 2019-12-25 | Discharge: 2019-12-26 | Disposition: A | Discharge status: Home / Self Care | Payer: Medicaid Other | Attending: Emergency Medicine | Admitting: Emergency Medicine

## 2019-12-25 ENCOUNTER — Other Ambulatory Visit: Payer: Self-pay

## 2019-12-25 ENCOUNTER — Encounter (HOSPITAL_BASED_OUTPATIENT_CLINIC_OR_DEPARTMENT_OTHER): Payer: Self-pay | Admitting: *Deleted

## 2019-12-25 ENCOUNTER — Ambulatory Visit
Admission: EM | Admit: 2019-12-25 | Discharge: 2019-12-25 | Discharge status: Against Medical Advice | Payer: Medicaid Other

## 2019-12-25 DIAGNOSIS — Z3A10 10 weeks gestation of pregnancy: Secondary | ICD-10-CM | POA: Insufficient documentation

## 2019-12-25 DIAGNOSIS — S5012XA Contusion of left forearm, initial encounter: Secondary | ICD-10-CM | POA: Insufficient documentation

## 2019-12-25 DIAGNOSIS — Z87891 Personal history of nicotine dependence: Secondary | ICD-10-CM | POA: Diagnosis not present

## 2019-12-25 DIAGNOSIS — O9A211 Injury, poisoning and certain other consequences of external causes complicating pregnancy, first trimester: Secondary | ICD-10-CM | POA: Insufficient documentation

## 2019-12-25 DIAGNOSIS — S40022A Contusion of left upper arm, initial encounter: Secondary | ICD-10-CM

## 2019-12-25 DIAGNOSIS — S40021A Contusion of right upper arm, initial encounter: Secondary | ICD-10-CM | POA: Diagnosis not present

## 2019-12-25 DIAGNOSIS — S29012A Strain of muscle and tendon of back wall of thorax, initial encounter: Secondary | ICD-10-CM | POA: Insufficient documentation

## 2019-12-25 DIAGNOSIS — T148XXA Other injury of unspecified body region, initial encounter: Secondary | ICD-10-CM
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
## 2019-12-26 DIAGNOSIS — S40021A Contusion of right upper arm, initial encounter: Secondary | ICD-10-CM | POA: Diagnosis not present

## 2019-12-26 DIAGNOSIS — O9A211 Injury, poisoning and certain other consequences of external causes complicating pregnancy, first trimester: Secondary | ICD-10-CM | POA: Diagnosis not present

## 2019-12-26 DIAGNOSIS — Z3A10 10 weeks gestation of pregnancy: Secondary | ICD-10-CM | POA: Diagnosis not present

## 2019-12-26 NOTE — Discharge Instructions (Addendum)
You may use over-the-counter Acetaminophen (Tylenol), topical muscle creams such as SalonPas, [employer], Bengay, etc. Please stretch, apply heat, and have massage therapy for additional assistance.
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
## 2019-12-27 ENCOUNTER — Encounter: Payer: Medicaid Other | Admitting: Obstetrics and Gynecology

## 2020-01-14 DIAGNOSIS — E669 Obesity, unspecified: Secondary | ICD-10-CM | POA: Diagnosis not present

## 2020-01-14 DIAGNOSIS — O09891 Supervision of other high risk pregnancies, first trimester: Secondary | ICD-10-CM | POA: Diagnosis not present

## 2020-01-14 DIAGNOSIS — O0991 Supervision of high risk pregnancy, unspecified, first trimester: Secondary | ICD-10-CM | POA: Diagnosis not present

## 2020-01-15 ENCOUNTER — Ambulatory Visit: Payer: Medicaid Other | Attending: Internal Medicine

## 2020-01-15 ENCOUNTER — Other Ambulatory Visit: Payer: Medicaid Other

## 2020-01-15 DIAGNOSIS — Z20822 Contact with and (suspected) exposure to covid-19: Secondary | ICD-10-CM

## 2020-01-15 DIAGNOSIS — Z23 Encounter for immunization: Secondary | ICD-10-CM

## 2020-01-15 NOTE — Progress Notes (Signed)
   Covid-19 Vaccination Clinic  Name:  Annette Greene    MRN: [id number] DOB: 12/11/1990  01/15/2020  [name] was observed post Covid-19 immunization for 15 minutes without incident. She was provided with Vaccine Information Sheet and instruction to access the V-Safe system.   [name] was instructed to call 911 with any severe reactions post vaccine: Marland Kitchen Difficulty breathing  . Swelling of face and throat  . A fast heartbeat  . A bad rash all over body  . Dizziness and weakness   Immunizations Administered    Name Date Dose VIS Date Route   Pfizer COVID-19 Vaccine 01/15/2020  2:48 PM 0.3 mL 05/16/2018 Intramuscular   Manufacturer: Luck   Lot: [id number]   [place]: [id number]

## 2020-01-16 DIAGNOSIS — O3431 Maternal care for cervical incompetence, first trimester: Secondary | ICD-10-CM | POA: Insufficient documentation

## 2020-01-16 LAB — NOVEL CORONAVIRUS, NAA: SARS-CoV-2, NAA: NOT DETECTED

## 2020-01-16 LAB — SARS-COV-2, NAA 2 DAY TAT

## 2020-01-17 DIAGNOSIS — O09891 Supervision of other high risk pregnancies, first trimester: Secondary | ICD-10-CM | POA: Diagnosis not present

## 2020-01-17 DIAGNOSIS — O3431 Maternal care for cervical incompetence, first trimester: Secondary | ICD-10-CM | POA: Diagnosis not present

## 2020-01-17 DIAGNOSIS — O99211 Obesity complicating pregnancy, first trimester: Secondary | ICD-10-CM | POA: Diagnosis not present

## 2020-01-17 DIAGNOSIS — Z3A13 13 weeks gestation of pregnancy: Secondary | ICD-10-CM | POA: Diagnosis not present

## 2020-01-18 ENCOUNTER — Other Ambulatory Visit: Payer: Self-pay

## 2020-01-18 ENCOUNTER — Inpatient Hospital Stay (HOSPITAL_COMMUNITY)
Admission: AD | Admit: 2020-01-18 | Discharge: 2020-01-18 | Disposition: A | Discharge status: Home / Self Care | Payer: Medicaid Other | Attending: Obstetrics & Gynecology | Admitting: Obstetrics & Gynecology

## 2020-01-18 ENCOUNTER — Encounter (HOSPITAL_COMMUNITY): Payer: Self-pay | Admitting: Obstetrics & Gynecology

## 2020-01-18 DIAGNOSIS — Z87891 Personal history of nicotine dependence: Secondary | ICD-10-CM | POA: Diagnosis not present

## 2020-01-18 DIAGNOSIS — O3432 Maternal care for cervical incompetence, second trimester: Secondary | ICD-10-CM | POA: Diagnosis not present

## 2020-01-18 DIAGNOSIS — Z3A13 13 weeks gestation of pregnancy: Secondary | ICD-10-CM | POA: Diagnosis not present

## 2020-01-18 DIAGNOSIS — R030 Elevated blood-pressure reading, without diagnosis of hypertension: Secondary | ICD-10-CM | POA: Diagnosis not present

## 2020-01-18 DIAGNOSIS — Z8619 Personal history of other infectious and parasitic diseases: Secondary | ICD-10-CM | POA: Insufficient documentation

## 2020-01-18 DIAGNOSIS — O26891 Other specified pregnancy related conditions, first trimester: Secondary | ICD-10-CM | POA: Diagnosis not present

## 2020-01-18 DIAGNOSIS — R102 Pelvic and perineal pain: Secondary | ICD-10-CM | POA: Diagnosis not present

## 2020-01-18 DIAGNOSIS — O26892 Other specified pregnancy related conditions, second trimester: Secondary | ICD-10-CM | POA: Diagnosis not present

## 2020-01-18 DIAGNOSIS — M549 Dorsalgia, unspecified: Secondary | ICD-10-CM | POA: Diagnosis not present

## 2020-01-18 DIAGNOSIS — O3431 Maternal care for cervical incompetence, first trimester: Secondary | ICD-10-CM | POA: Diagnosis not present

## 2020-01-18 DIAGNOSIS — G8918 Other acute postprocedural pain: Secondary | ICD-10-CM | POA: Diagnosis not present

## 2020-01-18 DIAGNOSIS — Z8751 Personal history of pre-term labor: Secondary | ICD-10-CM | POA: Diagnosis not present

## 2020-01-18 DIAGNOSIS — O99891 Other specified diseases and conditions complicating pregnancy: Secondary | ICD-10-CM

## 2020-01-18 LAB — URINALYSIS, ROUTINE W REFLEX MICROSCOPIC
Bacteria, UA: NONE SEEN
Bilirubin Urine: NEGATIVE
Glucose, UA: NEGATIVE mg/dL
Ketones, ur: NEGATIVE mg/dL
Leukocytes,Ua: NEGATIVE
Nitrite: NEGATIVE
Protein, ur: NEGATIVE mg/dL
Specific Gravity, Urine: 1.011 (ref 1.005–1.030)
pH: 6 (ref 5.0–8.0)

## 2020-01-18 MED ORDER — OXYCODONE-ACETAMINOPHEN 5-325 MG PO TABS: 1.0000 | ORAL_TABLET | Freq: Four times a day (QID) | ORAL | 0 refills | Status: DC | PRN

## 2020-01-18 MED ORDER — OXYCODONE-ACETAMINOPHEN 5-325 MG PO TABS
1.0000 | ORAL_TABLET | Freq: Four times a day (QID) | ORAL | 0 refills | Status: DC | PRN
Start: 2020-01-18 — End: 2020-01-18

## 2020-01-18 MED ORDER — OXYCODONE-ACETAMINOPHEN 5-325 MG PO TABS
2.0000 | ORAL_TABLET | Freq: Once | ORAL | Status: AC
  Administered 2020-01-18: 2 via ORAL
  Filled 2020-01-18: qty 2

## 2020-01-18 NOTE — MAU Note (Signed)
Cerclage placed yesterday morning [facility]).  Feeling a lot a lot of pressure.  With pain medicine her level is a 10.  Feels like baby is being pushed out, can't sit straight. Had some bleeding after procedure, stopped around 6

## 2020-01-18 NOTE — MAU Provider Note (Signed)
Chief Complaint: pelvic pressure   First Provider Initiated Contact with Patient 01/18/20 1206      SUBJECTIVE HPI: Annette Greene is a 29 y.o. G3P1102 at [redacted]w[redacted]d by early ultrasound who presents to maternity admissions reporting pelvic/rectal pressure since her cerclage was placed at [facility] 01/17/20.  She had a planned cerclage placed for hx 26 week delivery with incompetence cervix. She had term delivery with last pregnancy with cerclage placed prophylactically as well.    She denies vaginal bleeding, vaginal itching/burning, urinary symptoms, h/a, dizziness, n/v, or fever/chills.     Location: pelvis/low back/ rectum Quality: pressure Severity: 10/10 on pain scale Duration: 1 day Timing: constant Modifying factors: Tylenol 3 x 1 tab is not helping Associated signs and symptoms: None  HPI  Past Medical History:  Diagnosis Date  . Herpes   . Hx of chlamydia infection   . Infection    UTI  . Preterm labor    Past Surgical History:  Procedure Laterality Date  . CERVICAL CERCLAGE N/A 01/25/2018   Procedure: CERCLAGE CERVICAL;  Surgeon: Everett Graff, MD;  Location: [facility];  Service: Gynecology;  Laterality: N/A;  . TONSILLECTOMY AND ADENOIDECTOMY    . WISDOM TOOTH EXTRACTION     all 4 teeth   Social History   Socioeconomic History  . Marital status: Single    Spouse name: Not on file  . Number of children: Not on file  . Years of education: Not on file  . Highest education level: Not on file  Occupational History  . Not on file  Tobacco Use  . Smoking status: Former Smoker    Packs/day: 0.50    Types: Cigarettes    Quit date: 06/24/2016    Years since quitting: 3.5  . Smokeless tobacco: Never Used  . Tobacco comment: stopped since pregnant  Vaping Use  . Vaping Use: Never used  Substance and Sexual Activity  . Alcohol use: Not Currently    Comment: stopped with pregnancy  . Drug use: Not Currently  . Sexual activity: Not Currently    Birth  control/protection: None  Other Topics Concern  . Not on file  Social History Narrative  . Not on file   Social Determinants of Health   Financial Resource Strain:   . Difficulty of Paying Living Expenses: Not on file  Food Insecurity:   . Worried About Charity fundraiser in the Last Year: Not on file  . Ran Out of Food in the Last Year: Not on file  Transportation Needs:   . Lack of Transportation (Medical): Not on file  . Lack of Transportation (Non-Medical): Not on file  Physical Activity:   . Days of Exercise per Week: Not on file  . Minutes of Exercise per Session: Not on file  Stress:   . Feeling of Stress : Not on file  Social Connections:   . Frequency of Communication with Friends and Family: Not on file  . Frequency of Social Gatherings with Friends and Family: Not on file  . Attends Religious Services: Not on file  . Active Member of Clubs or Organizations: Not on file  . Attends Archivist Meetings: Not on file  . Marital Status: Not on file  Intimate Partner Violence:   . Fear of Current or Ex-Partner: Not on file  . Emotionally Abused: Not on file  . Physically Abused: Not on file  . Sexually Abused: Not on file   No current facility-administered medications on file prior  to encounter.   Current Outpatient Medications on File Prior to Encounter  Medication Sig Dispense Refill  . Prenatal Vit-Fe Fumarate-FA (PRENATAL MULTIVITAMIN) TABS tablet Take 1 tablet by mouth daily at 12 noon.    Marland Kitchen esomeprazole (NEXIUM) 40 MG capsule Take 40 mg by mouth daily at 12 noon.     No Known Allergies  ROS:  Review of Systems  Constitutional: Negative for chills, fatigue and fever.  Eyes: Negative for visual disturbance.  Respiratory: Negative for shortness of breath.   Cardiovascular: Negative for chest pain.  Gastrointestinal: Negative for abdominal pain, nausea and vomiting.  Genitourinary: Negative for difficulty urinating, dysuria, flank pain, pelvic pain,  vaginal bleeding, vaginal discharge and vaginal pain.  Neurological: Negative for dizziness and headaches.  Psychiatric/Behavioral: Negative.      I have reviewed patient's Past Medical Hx, Surgical Hx, Family Hx, Social Hx, medications and allergies.   Physical Exam   Patient Vitals for the past 24 hrs:  BP Temp Temp src Pulse Resp SpO2 Height Weight  01/18/20 1443 (!) 148/56 -- -- -- -- -- -- --  01/18/20 1128 (!) 141/75 98.1 F (36.7 C) Oral 77 18 100 % 5\' 7"  (1.702 m) 113.9 kg   Constitutional: Well-developed, well-nourished female in no acute distress.  Cardiovascular: normal rate Respiratory: normal effort GI: Abd soft, non-tender. Pos BS x 4 MS: Extremities nontender, no edema, normal ROM Neurologic: Alert and oriented x 4.  GU: Neg CVAT.  PELVIC EXAM: Cervix pink, visually closed, without lesion, cerclage in place and without tension, scant white creamy discharge, vaginal walls and external genitalia normal  Dilation: Closed Exam by:: L Leftwich-Kirby CNM  FHT 150s on bedside US  LAB RESULTS Results for orders placed or performed during the hospital encounter of 01/18/20 (from the past 24 hour(s))  Urinalysis, Routine w reflex microscopic Urine, Clean Catch     Status: Abnormal   Collection Time: 01/18/20 12:06 PM  Result Value Ref Range   Color, Urine YELLOW YELLOW   APPearance CLEAR CLEAR   Specific Gravity, Urine 1.011 1.005 - 1.030   pH 6.0 5.0 - 8.0   Glucose, UA NEGATIVE NEGATIVE mg/dL   Hgb urine dipstick SMALL (A) NEGATIVE   Bilirubin Urine NEGATIVE NEGATIVE   Ketones, ur NEGATIVE NEGATIVE mg/dL   Protein, ur NEGATIVE NEGATIVE mg/dL   Nitrite NEGATIVE NEGATIVE   Leukocytes,Ua NEGATIVE NEGATIVE   RBC / HPF 0-5 0 - 5 RBC/hpf   WBC, UA 0-5 0 - 5 WBC/hpf   Bacteria, UA NONE SEEN NONE SEEN   Squamous Epithelial / LPF 0-5 0 - 5   Mucus PRESENT        IMAGING No results found. Bedside US reveals active fetus, FHR 150s, amniotic fluid subjectively  wnl.  MAU Management/MDM: Orders Placed This Encounter  Procedures  . Urinalysis, Routine w reflex microscopic Urine, Clean Catch  . Discharge patient    Meds ordered this encounter  Medications  . oxyCODONE-acetaminophen (PERCOCET/ROXICET) 5-325 MG per tablet 2 tablet  . DISCONTD: oxyCODONE-acetaminophen (PERCOCET/ROXICET) 5-325 MG tablet    Sig: Take 1-2 tablets by mouth every 6 (six) hours as needed.    Dispense:  15 tablet    Refill:  0    Order Specific Question:   Supervising Provider    Answer:   Woodroe Mode [[id number]]  . oxyCODONE-acetaminophen (PERCOCET/ROXICET) 5-325 MG tablet    Sig: Take 1-2 tablets by mouth every 6 (six) hours as needed.    Dispense:  15 tablet  Refill:  0    Order Specific Question:   Supervising Provider    Answer:   Woodroe Mode [[id number]]    Pt cervix closed, thick, cerclage without tension and normal. With no evidence of cervical change, pain is most c/w postoperative pain.  Percocet 5/325 x 2 given in MAU with significant pain relief.  D/C home with Rx for Percocet 5/325, take 1-2 @ 6 hours PRN x 15 tabs.  F/U with prenatal care provider next week.  BP elevated today, pt in pain, no hx HTN, BP check in office next week.  Pt discharged with strict return precautions.  ASSESSMENT 1. Postoperative pain   2. Back pain affecting pregnancy in second trimester   3. Cervical cerclage suture present in second trimester   4. Elevated blood pressure reading without diagnosis of hypertension     PLAN Discharge home Allergies as of 01/18/2020   No Known Allergies     Medication List    STOP taking these medications   acetaminophen-codeine 300-30 MG tablet Commonly known as: TYLENOL #3   naproxen 500 MG tablet Commonly known as: NAPROSYN     TAKE these medications   esomeprazole 40 MG capsule Commonly known as: NEXIUM Take 40 mg by mouth daily at 12 noon.   oxyCODONE-acetaminophen 5-325 MG tablet Commonly known as: PERCOCET/ROXICET Take  1-2 tablets by mouth every 6 (six) hours as needed.   prenatal multivitamin Tabs tablet Take 1 tablet by mouth daily at 12 noon.       Follow-up Information    Your Prenatal Care Provider Follow up.   Why: As scheduled, return to MAU or ED for emergencies.              Fatima Blank Certified Nurse-Midwife 01/18/2020  3:13 PM

## 2020-01-18 NOTE — MAU Note (Signed)
Fetal HR verified by provider via bedside US

## 2020-02-05 ENCOUNTER — Encounter: Payer: Self-pay | Admitting: Family Medicine

## 2020-02-05 ENCOUNTER — Ambulatory Visit (INDEPENDENT_AMBULATORY_CARE_PROVIDER_SITE_OTHER): Payer: Medicaid Other | Admitting: Family Medicine

## 2020-02-05 ENCOUNTER — Other Ambulatory Visit (HOSPITAL_COMMUNITY)
Admission: RE | Admit: 2020-02-05 | Discharge: 2020-02-05 | Disposition: A | Discharge status: Home / Self Care | Payer: Medicaid Other | Source: Ambulatory Visit | Attending: Family Medicine | Admitting: Family Medicine

## 2020-02-05 ENCOUNTER — Ambulatory Visit: Payer: Self-pay

## 2020-02-05 ENCOUNTER — Other Ambulatory Visit: Payer: Self-pay

## 2020-02-05 VITALS — BP 116/72 | HR 83 | Wt 255.0 lb

## 2020-02-05 DIAGNOSIS — O0990 Supervision of high risk pregnancy, unspecified, unspecified trimester: Secondary | ICD-10-CM

## 2020-02-05 DIAGNOSIS — O3430 Maternal care for cervical incompetence, unspecified trimester: Secondary | ICD-10-CM

## 2020-02-05 DIAGNOSIS — Z8619 Personal history of other infectious and parasitic diseases: Secondary | ICD-10-CM

## 2020-02-05 LAB — POCT URINALYSIS DIP (DEVICE)
Bilirubin Urine: NEGATIVE
Glucose, UA: NEGATIVE mg/dL
Ketones, ur: NEGATIVE mg/dL
Leukocytes,Ua: NEGATIVE
Nitrite: NEGATIVE
Protein, ur: NEGATIVE mg/dL
Specific Gravity, Urine: 1.025 (ref 1.005–1.030)
Urobilinogen, UA: 0.2 mg/dL (ref 0.0–1.0)
pH: 7 (ref 5.0–8.0)

## 2020-02-05 MED ORDER — BLOOD PRESSURE KIT DEVI: 1.0000 | Freq: Once | 0 refills | Status: AC

## 2020-02-05 NOTE — Patient Instructions (Signed)
 Second Trimester of Pregnancy The second trimester is from week 14 through week 27 (months 4 through 6). The second trimester is often a time when you feel your best. Your body has adjusted to being pregnant, and you begin to feel better physically. Usually, morning sickness has lessened or quit completely, you may have more energy, and you may have an increase in appetite. The second trimester is also a time when the fetus is growing rapidly. At the end of the sixth month, the fetus is about 9 inches long and weighs about 1 pounds. You will likely begin to feel the baby move (quickening) between 16 and 20 weeks of pregnancy. Body changes during your second trimester Your body continues to go through many changes during your second trimester. The changes vary from woman to woman.  Your weight will continue to increase. You will notice your lower abdomen bulging out.  You may begin to get stretch marks on your hips, abdomen, and breasts.  You may develop headaches that can be relieved by medicines. The medicines should be approved by your health care provider.  You may urinate more often because the fetus is pressing on your bladder.  You may develop or continue to have heartburn as a result of your pregnancy.  You may develop constipation because certain hormones are causing the muscles that push waste through your intestines to slow down.  You may develop hemorrhoids or swollen, bulging veins (varicose veins).  You may have back pain. This is caused by: ? Weight gain. ? Pregnancy hormones that are relaxing the joints in your pelvis. ? A shift in weight and the muscles that support your balance.  Your breasts will continue to grow and they will continue to become tender.  Your gums may bleed and may be sensitive to brushing and flossing.  Dark spots or blotches (chloasma, mask of pregnancy) may develop on your face. This will likely fade after the baby is born.  A dark line from  your belly button to the pubic area (linea nigra) may appear. This will likely fade after the baby is born.  You may have changes in your hair. These can include thickening of your hair, rapid growth, and changes in texture. Some women also have hair loss during or after pregnancy, or hair that feels dry or thin. Your hair will most likely return to normal after your baby is born. What to expect at prenatal visits During a routine prenatal visit:  You will be weighed to make sure you and the fetus are growing normally.  Your blood pressure will be taken.  Your abdomen will be measured to track your baby's growth.  The fetal heartbeat will be listened to.  Any test results from the previous visit will be discussed. Your health care provider may ask you:  How you are feeling.  If you are feeling the baby move.  If you have had any abnormal symptoms, such as leaking fluid, bleeding, severe headaches, or abdominal cramping.  If you are using any tobacco products, including cigarettes, chewing tobacco, and electronic cigarettes.  If you have any questions. Other tests that may be performed during your second trimester include:  Blood tests that check for: ? Low iron levels (anemia). ? High blood sugar that affects pregnant women (gestational diabetes) between 24 and 28 weeks. ? Rh antibodies. This is to check for a protein on red blood cells (Rh factor).  Urine tests to check for infections, diabetes, or protein in   the urine.  An ultrasound to confirm the proper growth and development of the baby.  An amniocentesis to check for possible genetic problems.  Fetal screens for spina bifida and Down syndrome.  HIV (human immunodeficiency virus) testing. Routine prenatal testing includes screening for HIV, unless you choose not to have this test. Follow these instructions at home: Medicines  Follow your health care provider's instructions regarding medicine use. Specific medicines  may be either safe or unsafe to take during pregnancy.  Take a prenatal vitamin that contains at least 600 micrograms (mcg) of folic acid.  If you develop constipation, try taking a stool softener if your health care provider approves. Eating and drinking   Eat a balanced diet that includes fresh fruits and vegetables, whole grains, good sources of protein such as meat, eggs, or tofu, and low-fat dairy. Your health care provider will help you determine the amount of weight gain that is right for you.  Avoid raw meat and uncooked cheese. These carry germs that can cause birth defects in the baby.  If you have low calcium intake from food, talk to your health care provider about whether you should take a daily calcium supplement.  Limit foods that are high in fat and processed sugars, such as fried and sweet foods.  To prevent constipation: ? Drink enough fluid to keep your urine clear or pale yellow. ? Eat foods that are high in fiber, such as fresh fruits and vegetables, whole grains, and beans. Activity  Exercise only as directed by your health care provider. Most women can continue their usual exercise routine during pregnancy. Try to exercise for 30 minutes at least 5 days a week. Stop exercising if you experience uterine contractions.  Avoid heavy lifting, wear low heel shoes, and practice good posture.  A sexual relationship may be continued unless your health care provider directs you otherwise. Relieving pain and discomfort  Wear a good support bra to prevent discomfort from breast tenderness.  Take warm sitz baths to soothe any pain or discomfort caused by hemorrhoids. Use hemorrhoid cream if your health care provider approves.  Rest with your legs elevated if you have leg cramps or low back pain.  If you develop varicose veins, wear support hose. Elevate your feet for 15 minutes, 3-4 times a day. Limit salt in your diet. Prenatal Care  Write down your questions. Take  them to your prenatal visits.  Keep all your prenatal visits as told by your health care provider. This is important. Safety  Wear your seat belt at all times when driving.  Make a list of emergency phone numbers, including numbers for family, friends, the hospital, and police and fire departments. General instructions  Ask your health care provider for a referral to a local prenatal education class. Begin classes no later than the beginning of month 6 of your pregnancy.  Ask for help if you have counseling or nutritional needs during pregnancy. Your health care provider can offer advice or refer you to specialists for help with various needs.  Do not use hot tubs, steam rooms, or saunas.  Do not douche or use tampons or scented sanitary pads.  Do not cross your legs for long periods of time.  Avoid cat litter boxes and soil used by cats. These carry germs that can cause birth defects in the baby and possibly loss of the fetus by miscarriage or stillbirth.  Avoid all smoking, herbs, alcohol, and unprescribed drugs. Chemicals in these products can affect the   formation and growth of the baby.  Do not use any products that contain nicotine or tobacco, such as cigarettes and e-cigarettes. If you need help quitting, ask your health care provider.  Visit your dentist if you have not gone yet during your pregnancy. Use a soft toothbrush to brush your teeth and be gentle when you floss. Contact a health care provider if:  You have dizziness.  You have mild pelvic cramps, pelvic pressure, or nagging pain in the abdominal area.  You have persistent nausea, vomiting, or diarrhea.  You have a bad smelling vaginal discharge.  You have pain when you urinate. Get help right away if:  You have a fever.  You are leaking fluid from your vagina.  You have spotting or bleeding from your vagina.  You have severe abdominal cramping or pain.  You have rapid weight gain or weight loss.  You  have shortness of breath with chest pain.  You notice sudden or extreme swelling of your face, hands, ankles, feet, or legs.  You have not felt your baby move in over an hour.  You have severe headaches that do not go away when you take medicine.  You have vision changes. Summary  The second trimester is from week 14 through week 27 (months 4 through 6). It is also a time when the fetus is growing rapidly.  Your body goes through many changes during pregnancy. The changes vary from woman to woman.  Avoid all smoking, herbs, alcohol, and unprescribed drugs. These chemicals affect the formation and growth your baby.  Do not use any tobacco products, such as cigarettes, chewing tobacco, and e-cigarettes. If you need help quitting, ask your health care provider.  Contact your health care provider if you have any questions. Keep all prenatal visits as told by your health care provider. This is important. This information is not intended to replace advice given to you by your health care provider. Make sure you discuss any questions you have with your health care provider. Document Revised: 06/30/2018 Document Reviewed: 04/13/2016 [employer] Patient Education  2020 [employer]   Contraception Choices Contraception, also called birth control, refers to methods or devices that prevent pregnancy. Hormonal methods Contraceptive implant  A contraceptive implant is a thin, plastic tube that contains a hormone. It is inserted into the upper part of the arm. It can remain in place for up to 3 years. Progestin-only injections Progestin-only injections are injections of progestin, a synthetic form of the hormone progesterone. They are given every 3 months by a health care provider. Birth control pills  Birth control pills are pills that contain hormones that prevent pregnancy. They must be taken once a day, preferably at the same time each day. Birth control patch  The birth control patch  contains hormones that prevent pregnancy. It is placed on the skin and must be changed once a week for three weeks and removed on the fourth week. A prescription is needed to use this method of contraception. Vaginal ring  A vaginal ring contains hormones that prevent pregnancy. It is placed in the vagina for three weeks and removed on the fourth week. After that, the process is repeated with a new ring. A prescription is needed to use this method of contraception. Emergency contraceptive Emergency contraceptives prevent pregnancy after unprotected sex. They come in pill form and can be taken up to 5 days after sex. They work best the sooner they are taken after having sex. Most emergency contraceptives are available   without a prescription. This method should not be used as your only form of birth control. Barrier methods Female condom  A female condom is a thin sheath that is worn over the penis during sex. Condoms keep sperm from going inside a woman's body. They can be used with a spermicide to increase their effectiveness. They should be disposed after a single use. Female condom  A female condom is a soft, loose-fitting sheath that is put into the vagina before sex. The condom keeps sperm from going inside a woman's body. They should be disposed after a single use. Diaphragm  A diaphragm is a soft, dome-shaped barrier. It is inserted into the vagina before sex, along with a spermicide. The diaphragm blocks sperm from entering the uterus, and the spermicide kills sperm. A diaphragm should be left in the vagina for 6-8 hours after sex and removed within 24 hours. A diaphragm is prescribed and fitted by a health care provider. A diaphragm should be replaced every 1-2 years, after giving birth, after gaining more than 15 lb (6.8 kg), and after pelvic surgery. Cervical cap  A cervical cap is a round, soft latex or plastic cup that fits over the cervix. It is inserted into the vagina before sex, along  with spermicide. It blocks sperm from entering the uterus. The cap should be left in place for 6-8 hours after sex and removed within 48 hours. A cervical cap must be prescribed and fitted by a health care provider. It should be replaced every 2 years. Sponge  A sponge is a soft, circular piece of polyurethane foam with spermicide on it. The sponge helps block sperm from entering the uterus, and the spermicide kills sperm. To use it, you make it wet and then insert it into the vagina. It should be inserted before sex, left in for at least 6 hours after sex, and removed and thrown away within 30 hours. Spermicides Spermicides are chemicals that kill or block sperm from entering the cervix and uterus. They can come as a cream, jelly, suppository, foam, or tablet. A spermicide should be inserted into the vagina with an applicator at least 10-15 minutes before sex to allow time for it to work. The process must be repeated every time you have sex. Spermicides do not require a prescription. Intrauterine contraception Intrauterine device (IUD) An IUD is a T-shaped device that is put in a woman's uterus. There are two types:  Hormone IUD.This type contains progestin, a synthetic form of the hormone progesterone. This type can stay in place for 3-5 years.  Copper IUD.This type is wrapped in copper wire. It can stay in place for 10 years.  Permanent methods of contraception Female tubal ligation In this method, a woman's fallopian tubes are sealed, tied, or blocked during surgery to prevent eggs from traveling to the uterus. Hysteroscopic sterilization In this method, a small, flexible insert is placed into each fallopian tube. The inserts cause scar tissue to form in the fallopian tubes and block them, so sperm cannot reach an egg. The procedure takes about 3 months to be effective. Another form of birth control must be used during those 3 months. Female sterilization This is a procedure to tie off the  tubes that carry sperm (vasectomy). After the procedure, the man can still ejaculate fluid (semen). Natural planning methods Natural family planning In this method, a couple does not have sex on days when the woman could become pregnant. Calendar method This means keeping track of the length   of each menstrual cycle, identifying the days when pregnancy can happen, and not having sex on those days. Ovulation method In this method, a couple avoids sex during ovulation. Symptothermal method This method involves not having sex during ovulation. The woman typically checks for ovulation by watching changes in her temperature and in the consistency of cervical mucus. Post-ovulation method In this method, a couple waits to have sex until after ovulation. Summary  Contraception, also called birth control, means methods or devices that prevent pregnancy.  Hormonal methods of contraception include implants, injections, pills, patches, vaginal rings, and emergency contraceptives.  Barrier methods of contraception can include female condoms, female condoms, diaphragms, cervical caps, sponges, and spermicides.  There are two types of IUDs (intrauterine devices). An IUD can be put in a woman's uterus to prevent pregnancy for 3-5 years.  Permanent sterilization can be done through a procedure for males, females, or both.  Natural family planning methods involve not having sex on days when the woman could become pregnant. This information is not intended to replace advice given to you by your health care provider. Make sure you discuss any questions you have with your health care provider. Document Revised: 03/10/2017 Document Reviewed: 04/10/2016 [employer] Patient Education  2020 [employer]   Breastfeeding  Choosing to breastfeed is one of the best decisions you can make for yourself and your baby. A change in hormones during pregnancy causes your breasts to make breast milk in your milk-producing  glands. Hormones prevent breast milk from being released before your baby is born. They also prompt milk flow after birth. Once breastfeeding has begun, thoughts of your baby, as well as his or her sucking or crying, can stimulate the release of milk from your milk-producing glands. Benefits of breastfeeding Research shows that breastfeeding offers many health benefits for infants and mothers. It also offers a cost-free and convenient way to feed your baby. For your baby  Your first milk (colostrum) helps your baby's digestive system to function better.  Special cells in your milk (antibodies) help your baby to fight off infections.  Breastfed babies are less likely to develop asthma, allergies, obesity, or type 2 diabetes. They are also at lower risk for sudden infant death syndrome (SIDS).  Nutrients in breast milk are better able to meet your baby's needs compared to infant formula.  Breast milk improves your baby's brain development. For you  Breastfeeding helps to create a very special bond between you and your baby.  Breastfeeding is convenient. Breast milk costs nothing and is always available at the correct temperature.  Breastfeeding helps to burn calories. It helps you to lose the weight that you gained during pregnancy.  Breastfeeding makes your uterus return faster to its size before pregnancy. It also slows bleeding (lochia) after you give birth.  Breastfeeding helps to lower your risk of developing type 2 diabetes, osteoporosis, rheumatoid arthritis, cardiovascular disease, and breast, ovarian, uterine, and endometrial cancer later in life. Breastfeeding basics Starting breastfeeding  Find a comfortable place to sit or lie down, with your neck and back well-supported.  Place a pillow or a rolled-up blanket under your baby to bring him or her to the level of your breast (if you are seated). Nursing pillows are specially designed to help support your arms and your baby while  you breastfeed.  Make sure that your baby's tummy (abdomen) is facing your abdomen.  Gently massage your breast. With your fingertips, massage from the outer edges of your breast inward toward   the nipple. This encourages milk flow. If your milk flows slowly, you may need to continue this action during the feeding.  Support your breast with 4 fingers underneath and your thumb above your nipple (make the letter "C" with your hand). Make sure your fingers are well away from your nipple and your baby's mouth.  Stroke your baby's lips gently with your finger or nipple.  When your baby's mouth is open wide enough, quickly bring your baby to your breast, placing your entire nipple and as much of the areola as possible into your baby's mouth. The areola is the colored area around your nipple. ? More areola should be visible above your baby's upper lip than below the lower lip. ? Your baby's lips should be opened and extended outward (flanged) to ensure an adequate, comfortable latch. ? Your baby's tongue should be between his or her lower gum and your breast.  Make sure that your baby's mouth is correctly positioned around your nipple (latched). Your baby's lips should create a seal on your breast and be turned out (everted).  It is common for your baby to suck about 2-3 minutes in order to start the flow of breast milk. Latching Teaching your baby how to latch onto your breast properly is very important. An improper latch can cause nipple pain, decreased milk supply, and poor weight gain in your baby. Also, if your baby is not latched onto your nipple properly, he or she may swallow some air during feeding. This can make your baby fussy. Burping your baby when you switch breasts during the feeding can help to get rid of the air. However, teaching your baby to latch on properly is still the best way to prevent fussiness from swallowing air while breastfeeding. Signs that your baby has successfully  latched onto your nipple  Silent tugging or silent sucking, without causing you pain. Infant's lips should be extended outward (flanged).  Swallowing heard between every 3-4 sucks once your milk has started to flow (after your let-down milk reflex occurs).  Muscle movement above and in front of his or her ears while sucking. Signs that your baby has not successfully latched onto your nipple  Sucking sounds or smacking sounds from your baby while breastfeeding.  Nipple pain. If you think your baby has not latched on correctly, slip your finger into the corner of your baby's mouth to break the suction and place it between your baby's gums. Attempt to start breastfeeding again. Signs of successful breastfeeding Signs from your baby  Your baby will gradually decrease the number of sucks or will completely stop sucking.  Your baby will fall asleep.  Your baby's body will relax.  Your baby will retain a small amount of milk in his or her mouth.  Your baby will let go of your breast by himself or herself. Signs from you  Breasts that have increased in firmness, weight, and size 1-3 hours after feeding.  Breasts that are softer immediately after breastfeeding.  Increased milk volume, as well as a change in milk consistency and color by the fifth day of breastfeeding.  Nipples that are not sore, cracked, or bleeding. Signs that your baby is getting enough milk  Wetting at least 1-2 diapers during the first 24 hours after birth.  Wetting at least 5-6 diapers every 24 hours for the first week after birth. The urine should be clear or pale yellow by the age of 5 days.  Wetting 6-8 diapers every 24 hours as   your baby continues to grow and develop.  At least 3 stools in a 24-hour period by the age of 5 days. The stool should be soft and yellow.  At least 3 stools in a 24-hour period by the age of 7 days. The stool should be seedy and yellow.  No loss of weight greater than 10% of  birth weight during the first 3 days of life.  Average weight gain of 4-7 oz (113-198 g) per week after the age of 4 days.  Consistent daily weight gain by the age of 5 days, without weight loss after the age of 2 weeks. After a feeding, your baby may spit up a small amount of milk. This is normal. Breastfeeding frequency and duration Frequent feeding will help you make more milk and can prevent sore nipples and extremely full breasts (breast engorgement). Breastfeed when you feel the need to reduce the fullness of your breasts or when your baby shows signs of hunger. This is called "breastfeeding on demand." Signs that your baby is hungry include:  Increased alertness, activity, or restlessness.  Movement of the head from side to side.  Opening of the mouth when the corner of the mouth or cheek is stroked (rooting).  Increased sucking sounds, smacking lips, cooing, sighing, or squeaking.  Hand-to-mouth movements and sucking on fingers or hands.  Fussing or crying. Avoid introducing a pacifier to your baby in the first 4-6 weeks after your baby is born. After this time, you may choose to use a pacifier. Research has shown that pacifier use during the first year of a baby's life decreases the risk of sudden infant death syndrome (SIDS). Allow your baby to feed on each breast as long as he or she wants. When your baby unlatches or falls asleep while feeding from the first breast, offer the second breast. Because newborns are often sleepy in the first few weeks of life, you may need to awaken your baby to get him or her to feed. Breastfeeding times will vary from baby to baby. However, the following rules can serve as a guide to help you make sure that your baby is properly fed:  Newborns (babies 4 weeks of age or younger) may breastfeed every 1-3 hours.  Newborns should not go without breastfeeding for longer than 3 hours during the day or 5 hours during the night.  You should breastfeed  your baby a minimum of 8 times in a 24-hour period. Breast milk pumping     Pumping and storing breast milk allows you to make sure that your baby is exclusively fed your breast milk, even at times when you are unable to breastfeed. This is especially important if you go back to work while you are still breastfeeding, or if you are not able to be present during feedings. Your lactation consultant can help you find a method of pumping that works best for you and give you guidelines about how long it is safe to store breast milk. Caring for your breasts while you breastfeed Nipples can become dry, cracked, and sore while breastfeeding. The following recommendations can help keep your breasts moisturized and healthy:  Avoid using soap on your nipples.  Wear a supportive bra designed especially for nursing. Avoid wearing underwire-style bras or extremely tight bras (sports bras).  Air-dry your nipples for 3-4 minutes after each feeding.  Use only cotton bra pads to absorb leaked breast milk. Leaking of breast milk between feedings is normal.  Use lanolin on your nipples   after breastfeeding. Lanolin helps to maintain your skin's normal moisture barrier. Pure lanolin is not harmful (not toxic) to your baby. You may also hand express a few drops of breast milk and gently massage that milk into your nipples and allow the milk to air-dry. In the first few weeks after giving birth, some women experience breast engorgement. Engorgement can make your breasts feel heavy, warm, and tender to the touch. Engorgement peaks within 3-5 days after you give birth. The following recommendations can help to ease engorgement:  Completely empty your breasts while breastfeeding or pumping. You may want to start by applying warm, moist heat (in the shower or with warm, water-soaked hand towels) just before feeding or pumping. This increases circulation and helps the milk flow. If your baby does not completely empty your  breasts while breastfeeding, pump any extra milk after he or she is finished.  Apply ice packs to your breasts immediately after breastfeeding or pumping, unless this is too uncomfortable for you. To do this: ? Put ice in a plastic bag. ? Place a towel between your skin and the bag. ? Leave the ice on for 20 minutes, 2-3 times a day.  Make sure that your baby is latched on and positioned properly while breastfeeding. If engorgement persists after 48 hours of following these recommendations, contact your health care provider or a lactation consultant. Overall health care recommendations while breastfeeding  Eat 3 healthy meals and 3 snacks every day. Well-nourished mothers who are breastfeeding need an additional 450-500 calories a day. You can meet this requirement by increasing the amount of a balanced diet that you eat.  Drink enough water to keep your urine pale yellow or clear.  Rest often, relax, and continue to take your prenatal vitamins to prevent fatigue, stress, and low vitamin and mineral levels in your body (nutrient deficiencies).  Do not use any products that contain nicotine or tobacco, such as cigarettes and e-cigarettes. Your baby may be harmed by chemicals from cigarettes that pass into breast milk and exposure to secondhand smoke. If you need help quitting, ask your health care provider.  Avoid alcohol.  Do not use illegal drugs or marijuana.  Talk with your health care provider before taking any medicines. These include over-the-counter and prescription medicines as well as vitamins and herbal supplements. Some medicines that may be harmful to your baby can pass through breast milk.  It is possible to become pregnant while breastfeeding. If birth control is desired, ask your health care provider about options that will be safe while breastfeeding your baby. Where to find more information: [employer]: www.llli.org Contact a health care provider  if:  You feel like you want to stop breastfeeding or have become frustrated with breastfeeding.  Your nipples are cracked or bleeding.  Your breasts are red, tender, or warm.  You have: ? Painful breasts or nipples. ? A swollen area on either breast. ? A fever or chills. ? Nausea or vomiting. ? Drainage other than breast milk from your nipples.  Your breasts do not become full before feedings by the fifth day after you give birth.  You feel sad and depressed.  Your baby is: ? Too sleepy to eat well. ? Having trouble sleeping. ? More than 1 week old and wetting fewer than 6 diapers in a 24-hour period. ? Not gaining weight by 5 days of age.  Your baby has fewer than 3 stools in a 24-hour period.  Your baby's skin or   the white parts of his or her eyes become yellow. Get help right away if:  Your baby is overly tired (lethargic) and does not want to wake up and feed.  Your baby develops an unexplained fever. Summary  Breastfeeding offers many health benefits for infant and mothers.  Try to breastfeed your infant when he or she shows early signs of hunger.  Gently tickle or stroke your baby's lips with your finger or nipple to allow the baby to open his or her mouth. Bring the baby to your breast. Make sure that much of the areola is in your baby's mouth. Offer one side and burp the baby before you offer the other side.  Talk with your health care provider or lactation consultant if you have questions or you face problems as you breastfeed. This information is not intended to replace advice given to you by your health care provider. Make sure you discuss any questions you have with your health care provider. Document Revised: 06/02/2017 Document Reviewed: 04/09/2016 Elsevier Patient Education  2020 Elsevier Inc.  

## 2020-02-05 NOTE — Progress Notes (Signed)
 Subjective:  Annette Greene is a 29 y.o. G3P1102 at [redacted]w[redacted]d being seen today for ongoing prenatal care.  She is currently monitored for the following issues for this high-risk pregnancy and has Incompetent cervix in pregnancy, antepartum; Obesity, morbid (HCC); History of herpes simplex type 2 infection; Human papilloma virus infection; Irregular periods; Cyst of ovary; SVD (spontaneous vaginal delivery); Postpartum exam; Pancreatitis; Hidradenitis suppurativa; Supervision of high risk pregnancy, antepartum; and Cervical insufficiency during pregnancy in first trimester, antepartum on their problem list.  Patient reports some leaking of fluid the week prior.  Contractions: Not present. Vag. Bleeding: None.  Movement: Absent.   Reports gush of fluid about a week and a half ago Felt a lot of pressure and a gush of water came out, had small spot on the bed Went to put on a pad but no further leakage of fluid No color or odor to fluid  Previously seeing Dr. Henry Dorn in [place] because did not feel listened to Prenatal labs reviewed on patient's phone, all normal  Interested in a water birth   The following portions of the patient's history were reviewed and updated as appropriate: allergies, current medications, past family history, past medical history, past social history, past surgical history and problem list. Problem list updated.  Objective:   Vitals:   02/05/20 1431  BP: 116/72  Pulse: 83  Weight: 255 lb (115.7 kg)    Fetal Status: Fetal Heart Rate (bpm): 155   Movement: Absent     General:  Alert, oriented and cooperative. Patient is in no acute distress.  Skin: Skin is warm and dry. No rash noted.   Cardiovascular: Normal heart rate noted  Respiratory: Normal respiratory effort, no problems with respiration noted  Abdomen: Soft, gravid, appropriate for gestational age. Pain/Pressure: Present     Pelvic: Vag. Bleeding: None Vag D/C Character: Watery   Cervical  exam deferred        Extremities: Normal range of motion.  Edema: None  Mental Status: Normal mood and affect. Normal behavior. Normal judgment and thought content.   Urinalysis:      Assessment and Plan:  Pregnancy: G3P1102 at [redacted]w[redacted]d  1. Supervision of high risk pregnancy, antepartum Transferring to our practice Has had initial prenatal labs and quad screen, reviewed on her phone through patient portal and all normal, at outside office, will await ROI Discussed collaborative nature of our practice with physicians, midwives, NP's, PA's, residents, and students all participating in care Interested in water birth, but did not have time to address adequately due to other concerns, scheduled class and consent at later visit Anatomy scan ordered Address flu/covid vaccines at next visit Pap collected - Blood Pressure Monitoring (BLOOD PRESSURE KIT) DEVI; 1 Device by Does not apply route once for 1 dose.  Dispense: 1 each; Refill: 0 - [facility] AMB BABYSCRIPTS SCHEDULE OPTIMIZATION - Culture, OB Urine - Cytology - PAP( [place])  2. Obesity, morbid (HCC)   3. History of herpes simplex type 2 infection Not addressed at this visit, discuss early prophylaxis at future visit  4. Incompetent cervix in pregnancy, antepartum S/p cerclage placement at [facility] on 01/17/2020 Visualized and unremarkable on speculum exam  5. Evaluation for ROM, not found Bedside US demonstrated a 4cm pocket of fluid On speculum exam no pooling, no LOF w valsalva, slide neg for ferning Reassured patient no signs of ROM at this time  Preterm labor symptoms and general obstetric precautions including but not limited to vaginal bleeding,   contractions, leaking of fluid and fetal movement were reviewed in detail with the patient. Please refer to After Visit Summary for other counseling recommendations.  Return in 4 weeks (on 03/04/2020) for follow up OB visit, HRC.   Eckstat, Matthew M, MD 

## 2020-02-06 ENCOUNTER — Encounter: Payer: Self-pay | Admitting: *Deleted

## 2020-02-06 LAB — CYTOLOGY - PAP: Diagnosis: NEGATIVE

## 2020-02-07 LAB — CULTURE, OB URINE

## 2020-02-07 LAB — URINE CULTURE, OB REFLEX

## 2020-02-23 DIAGNOSIS — O0990 Supervision of high risk pregnancy, unspecified, unspecified trimester: Secondary | ICD-10-CM | POA: Diagnosis not present

## 2020-02-25 ENCOUNTER — Telehealth: Payer: Self-pay | Admitting: Family Medicine

## 2020-02-25 NOTE — Telephone Encounter (Signed)
Pt states she is having a lot of abd pain, pressure and back contractions, and needs a call back ASAP. Thank you

## 2020-02-26 ENCOUNTER — Encounter: Payer: Self-pay | Admitting: *Deleted

## 2020-02-26 ENCOUNTER — Other Ambulatory Visit: Payer: Self-pay

## 2020-02-26 ENCOUNTER — Inpatient Hospital Stay (HOSPITAL_COMMUNITY)
Admission: AD | Admit: 2020-02-26 | Discharge: 2020-02-26 | Discharge status: Against Medical Advice | Payer: Medicaid Other | Attending: Obstetrics and Gynecology | Admitting: Obstetrics and Gynecology

## 2020-02-26 DIAGNOSIS — Z5321 Procedure and treatment not carried out due to patient leaving prior to being seen by health care provider: Secondary | ICD-10-CM | POA: Diagnosis not present

## 2020-02-26 LAB — URINALYSIS, ROUTINE W REFLEX MICROSCOPIC
Bacteria, UA: NONE SEEN
Bilirubin Urine: NEGATIVE
Glucose, UA: NEGATIVE mg/dL
Ketones, ur: NEGATIVE mg/dL
Leukocytes,Ua: NEGATIVE
Nitrite: NEGATIVE
Protein, ur: NEGATIVE mg/dL
Specific Gravity, Urine: 1.023 (ref 1.005–1.030)
pH: 6 (ref 5.0–8.0)

## 2020-02-26 NOTE — Telephone Encounter (Signed)
Returned patients call.   Patient reports that the back pain, pressure and contractions have stopped. She reports baby was not moving well and she went to MAU this morning and was checked out and sent home. She has no further questions or concerns at this time.

## 2020-02-29 ENCOUNTER — Other Ambulatory Visit: Payer: Self-pay

## 2020-02-29 ENCOUNTER — Ambulatory Visit (INDEPENDENT_AMBULATORY_CARE_PROVIDER_SITE_OTHER): Payer: Medicaid Other | Admitting: Lactation Services

## 2020-02-29 ENCOUNTER — Telehealth: Payer: Self-pay | Admitting: Family Medicine

## 2020-02-29 ENCOUNTER — Encounter: Payer: Self-pay | Admitting: *Deleted

## 2020-02-29 ENCOUNTER — Encounter: Payer: Self-pay | Admitting: Lactation Services

## 2020-02-29 DIAGNOSIS — O0990 Supervision of high risk pregnancy, unspecified, unspecified trimester: Secondary | ICD-10-CM

## 2020-02-29 NOTE — Telephone Encounter (Signed)
Pt was notified of nurse visit appointment today @ 10:20 am for blood pressure check by [facility].

## 2020-02-29 NOTE — Progress Notes (Signed)
Chart reviewed for nurse visit. Agree with plan of care.   Luvenia Redden, PA-C 02/29/2020 12:18 PM

## 2020-02-29 NOTE — Telephone Encounter (Signed)
Called patient

## 2020-02-29 NOTE — Telephone Encounter (Signed)
Received a call from the patient stating she was told to be seen within 24 hours because her BP was low. She is requesting a call back.

## 2020-02-29 NOTE — Progress Notes (Signed)
Patient here for BP check.   She reports HA x 2.5 days that she is not able to get rid of. She has taken 1000 mg Tylenol x 2 in the last 2 days and did not help. She is dizzy upon standing up fast each time. She is seeing spots intermittently throughout the day, mainly with standing up fast or leaning over.   She is concerned her BP cuff is not working and is reading low. Reviewed how to use her cuff, she was not aware of how to place. BP 130/74 with HR 87  BP in office 122/80 with HR 80  Patient is drinking 2-16 ounces water bottles daily. Advised her to increase to at least 64 ounces daily, preferably more.   Patient reports some contractions a few days ago. She cramps daily and report happens with all pregnancies. She has no bleeding or vaginal discharge.   Reviewed chart with Kerry Hough who recommended increasing fluids, Tylenol 1000 mg every 6 hours and heat to neck to see if headache will ease. If worsens she can call back to discuss Muscle relaxants or go to MAU for sever pain. Patient voiced understanding.   Patient to follow up in the office on 12/17.

## 2020-03-03 DIAGNOSIS — Z3482 Encounter for supervision of other normal pregnancy, second trimester: Secondary | ICD-10-CM | POA: Diagnosis not present

## 2020-03-03 DIAGNOSIS — O2692 Pregnancy related conditions, unspecified, second trimester: Secondary | ICD-10-CM | POA: Diagnosis not present

## 2020-03-03 LAB — OB RESULTS CONSOLE HGB/HCT, BLOOD
HCT: 33 (ref 29–41)
Hemoglobin: 10.1

## 2020-03-03 LAB — OB RESULTS CONSOLE TSH: TSH: 0.45

## 2020-03-03 LAB — OB RESULTS CONSOLE PLATELET COUNT: Platelets: 239

## 2020-03-07 ENCOUNTER — Ambulatory Visit: Payer: Medicaid Other

## 2020-03-07 ENCOUNTER — Ambulatory Visit (INDEPENDENT_AMBULATORY_CARE_PROVIDER_SITE_OTHER): Payer: Medicaid Other | Admitting: Family Medicine

## 2020-03-07 ENCOUNTER — Encounter: Payer: Self-pay | Admitting: Family Medicine

## 2020-03-07 ENCOUNTER — Other Ambulatory Visit: Payer: Self-pay

## 2020-03-07 ENCOUNTER — Other Ambulatory Visit: Payer: Self-pay | Admitting: Family Medicine

## 2020-03-07 ENCOUNTER — Ambulatory Visit: Payer: Medicaid Other | Attending: Family Medicine

## 2020-03-07 DIAGNOSIS — Z8619 Personal history of other infectious and parasitic diseases: Secondary | ICD-10-CM

## 2020-03-07 DIAGNOSIS — R030 Elevated blood-pressure reading, without diagnosis of hypertension: Secondary | ICD-10-CM | POA: Insufficient documentation

## 2020-03-07 DIAGNOSIS — O3430 Maternal care for cervical incompetence, unspecified trimester: Secondary | ICD-10-CM

## 2020-03-07 DIAGNOSIS — G8929 Other chronic pain: Secondary | ICD-10-CM

## 2020-03-07 DIAGNOSIS — R519 Headache, unspecified: Secondary | ICD-10-CM

## 2020-03-07 DIAGNOSIS — O0990 Supervision of high risk pregnancy, unspecified, unspecified trimester: Secondary | ICD-10-CM

## 2020-03-07 LAB — POCT URINALYSIS DIP (DEVICE)
Bilirubin Urine: NEGATIVE
Glucose, UA: NEGATIVE mg/dL
Ketones, ur: NEGATIVE mg/dL
Leukocytes,Ua: NEGATIVE
Nitrite: NEGATIVE
Protein, ur: NEGATIVE mg/dL
Specific Gravity, Urine: 1.025 (ref 1.005–1.030)
Urobilinogen, UA: 0.2 mg/dL (ref 0.0–1.0)
pH: 6.5 (ref 5.0–8.0)

## 2020-03-07 MED ORDER — CYCLOBENZAPRINE HCL 10 MG PO TABS: 10.0000 mg | ORAL_TABLET | Freq: Three times a day (TID) | ORAL | 1 refills | Status: DC | PRN

## 2020-03-07 NOTE — Progress Notes (Signed)
   Subjective:  Annette Greene is a 29 y.o. G3P1102 at [redacted]w[redacted]d being seen today for ongoing prenatal care.  She is currently monitored for the following issues for this high-risk pregnancy and has Incompetent cervix in pregnancy, antepartum; Obesity, morbid ([place]); History of herpes simplex type 2 infection; Human papilloma virus infection; Irregular periods; Cyst of ovary; SVD (spontaneous vaginal delivery); Postpartum exam; Pancreatitis; Hidradenitis suppurativa; and Supervision of high risk pregnancy, antepartum on their problem list.  Patient reports headache.  Contractions: Not present. Vag. Bleeding: None.  Movement: Present. Denies leaking of fluid.   The following portions of the patient's history were reviewed and updated as appropriate: allergies, current medications, past family history, past medical history, past social history, past surgical history and problem list. Problem list updated.  Objective:   Vitals:   03/07/20 0945  BP: (!) 103/59  Pulse: 78  Weight: 253 lb 4.8 oz (114.9 kg)    Fetal Status: Fetal Heart Rate (bpm): 144   Movement: Present     General:  Alert, oriented and cooperative. Patient is in no acute distress.  Skin: Skin is warm and dry. No rash noted.   Cardiovascular: Normal heart rate noted  Respiratory: Normal respiratory effort, no problems with respiration noted  Abdomen: Soft, gravid, appropriate for gestational age. Pain/Pressure: Present     Pelvic: Vag. Bleeding: None     Cervical exam deferred        Extremities: Normal range of motion.  Edema: None  Mental Status: Normal mood and affect. Normal behavior. Normal judgment and thought content.   Urinalysis:      Assessment and Plan:  Pregnancy: G3P1102 at [redacted]w[redacted]d  1. Supervision of high risk pregnancy, antepartum BP and FHR normal Reports she went to back to Dr. Cyndie Chime office in [facility]. Discussed she needs to pick single OB provider, would like to continue w [facility] Had anatomy scan there, will  send ROI Reports headaches over past several weeks, tylenol not effective, will trial flexeril Had another long discussion WS:FKCL, reviewed that it will run out prior to the end of her pregnancy around 32 weeks and that she could be terminated at that time Patient understands and prefers to figure that out later, has too much pain and pressure due to her cerclage to continue Message sent to office staff to complete FMLA  2. Obesity, morbid ([place])   3. History of herpes simplex type 2 infection Hx of 26wk and 38wk delivery, consider early prophylaxis  4. Incompetent cervix in pregnancy, antepartum Cerclage placed 01/17/2020  5. Elevated BP without diagnosis of HTN While at Dr. Cyndie Chime office reports she had elevated BP 132/95 PreE labs obtained and reviewed on patient's phone, P:C 0.83, CMP and CBC unremarkable Monitor BP's closely  Preterm labor symptoms and general obstetric precautions including but not limited to vaginal bleeding, contractions, leaking of fluid and fetal movement were reviewed in detail with the patient. Please refer to After Visit Summary for other counseling recommendations.  Return in 4 weeks (on 04/04/2020).   Clarnce Flock, MD

## 2020-03-07 NOTE — Patient Instructions (Signed)
 Second Trimester of Pregnancy The second trimester is from week 14 through week 27 (months 4 through 6). The second trimester is often a time when you feel your best. Your body has adjusted to being pregnant, and you begin to feel better physically. Usually, morning sickness has lessened or quit completely, you may have more energy, and you may have an increase in appetite. The second trimester is also a time when the fetus is growing rapidly. At the end of the sixth month, the fetus is about 9 inches long and weighs about 1 pounds. You will likely begin to feel the baby move (quickening) between 16 and 20 weeks of pregnancy. Body changes during your second trimester Your body continues to go through many changes during your second trimester. The changes vary from woman to woman.  Your weight will continue to increase. You will notice your lower abdomen bulging out.  You may begin to get stretch marks on your hips, abdomen, and breasts.  You may develop headaches that can be relieved by medicines. The medicines should be approved by your health care provider.  You may urinate more often because the fetus is pressing on your bladder.  You may develop or continue to have heartburn as a result of your pregnancy.  You may develop constipation because certain hormones are causing the muscles that push waste through your intestines to slow down.  You may develop hemorrhoids or swollen, bulging veins (varicose veins).  You may have back pain. This is caused by: ? Weight gain. ? Pregnancy hormones that are relaxing the joints in your pelvis. ? A shift in weight and the muscles that support your balance.  Your breasts will continue to grow and they will continue to become tender.  Your gums may bleed and may be sensitive to brushing and flossing.  Dark spots or blotches (chloasma, mask of pregnancy) may develop on your face. This will likely fade after the baby is born.  A dark line from  your belly button to the pubic area (linea nigra) may appear. This will likely fade after the baby is born.  You may have changes in your hair. These can include thickening of your hair, rapid growth, and changes in texture. Some women also have hair loss during or after pregnancy, or hair that feels dry or thin. Your hair will most likely return to normal after your baby is born. What to expect at prenatal visits During a routine prenatal visit:  You will be weighed to make sure you and the fetus are growing normally.  Your blood pressure will be taken.  Your abdomen will be measured to track your baby's growth.  The fetal heartbeat will be listened to.  Any test results from the previous visit will be discussed. Your health care provider may ask you:  How you are feeling.  If you are feeling the baby move.  If you have had any abnormal symptoms, such as leaking fluid, bleeding, severe headaches, or abdominal cramping.  If you are using any tobacco products, including cigarettes, chewing tobacco, and electronic cigarettes.  If you have any questions. Other tests that may be performed during your second trimester include:  Blood tests that check for: ? Low iron levels (anemia). ? High blood sugar that affects pregnant women (gestational diabetes) between 24 and 28 weeks. ? Rh antibodies. This is to check for a protein on red blood cells (Rh factor).  Urine tests to check for infections, diabetes, or protein in   the urine.  An ultrasound to confirm the proper growth and development of the baby.  An amniocentesis to check for possible genetic problems.  Fetal screens for spina bifida and Down syndrome.  HIV (human immunodeficiency virus) testing. Routine prenatal testing includes screening for HIV, unless you choose not to have this test. Follow these instructions at home: Medicines  Follow your health care provider's instructions regarding medicine use. Specific medicines  may be either safe or unsafe to take during pregnancy.  Take a prenatal vitamin that contains at least 600 micrograms (mcg) of folic acid.  If you develop constipation, try taking a stool softener if your health care provider approves. Eating and drinking   Eat a balanced diet that includes fresh fruits and vegetables, whole grains, good sources of protein such as meat, eggs, or tofu, and low-fat dairy. Your health care provider will help you determine the amount of weight gain that is right for you.  Avoid raw meat and uncooked cheese. These carry germs that can cause birth defects in the baby.  If you have low calcium intake from food, talk to your health care provider about whether you should take a daily calcium supplement.  Limit foods that are high in fat and processed sugars, such as fried and sweet foods.  To prevent constipation: ? Drink enough fluid to keep your urine clear or pale yellow. ? Eat foods that are high in fiber, such as fresh fruits and vegetables, whole grains, and beans. Activity  Exercise only as directed by your health care provider. Most women can continue their usual exercise routine during pregnancy. Try to exercise for 30 minutes at least 5 days a week. Stop exercising if you experience uterine contractions.  Avoid heavy lifting, wear low heel shoes, and practice good posture.  A sexual relationship may be continued unless your health care provider directs you otherwise. Relieving pain and discomfort  Wear a good support bra to prevent discomfort from breast tenderness.  Take warm sitz baths to soothe any pain or discomfort caused by hemorrhoids. Use hemorrhoid cream if your health care provider approves.  Rest with your legs elevated if you have leg cramps or low back pain.  If you develop varicose veins, wear support hose. Elevate your feet for 15 minutes, 3-4 times a day. Limit salt in your diet. Prenatal Care  Write down your questions. Take  them to your prenatal visits.  Keep all your prenatal visits as told by your health care provider. This is important. Safety  Wear your seat belt at all times when driving.  Make a list of emergency phone numbers, including numbers for family, friends, the hospital, and police and fire departments. General instructions  Ask your health care provider for a referral to a local prenatal education class. Begin classes no later than the beginning of month 6 of your pregnancy.  Ask for help if you have counseling or nutritional needs during pregnancy. Your health care provider can offer advice or refer you to specialists for help with various needs.  Do not use hot tubs, steam rooms, or saunas.  Do not douche or use tampons or scented sanitary pads.  Do not cross your legs for long periods of time.  Avoid cat litter boxes and soil used by cats. These carry germs that can cause birth defects in the baby and possibly loss of the fetus by miscarriage or stillbirth.  Avoid all smoking, herbs, alcohol, and unprescribed drugs. Chemicals in these products can affect the   formation and growth of the baby.  Do not use any products that contain nicotine or tobacco, such as cigarettes and e-cigarettes. If you need help quitting, ask your health care provider.  Visit your dentist if you have not gone yet during your pregnancy. Use a soft toothbrush to brush your teeth and be gentle when you floss. Contact a health care provider if:  You have dizziness.  You have mild pelvic cramps, pelvic pressure, or nagging pain in the abdominal area.  You have persistent nausea, vomiting, or diarrhea.  You have a bad smelling vaginal discharge.  You have pain when you urinate. Get help right away if:  You have a fever.  You are leaking fluid from your vagina.  You have spotting or bleeding from your vagina.  You have severe abdominal cramping or pain.  You have rapid weight gain or weight loss.  You  have shortness of breath with chest pain.  You notice sudden or extreme swelling of your face, hands, ankles, feet, or legs.  You have not felt your baby move in over an hour.  You have severe headaches that do not go away when you take medicine.  You have vision changes. Summary  The second trimester is from week 14 through week 27 (months 4 through 6). It is also a time when the fetus is growing rapidly.  Your body goes through many changes during pregnancy. The changes vary from woman to woman.  Avoid all smoking, herbs, alcohol, and unprescribed drugs. These chemicals affect the formation and growth your baby.  Do not use any tobacco products, such as cigarettes, chewing tobacco, and e-cigarettes. If you need help quitting, ask your health care provider.  Contact your health care provider if you have any questions. Keep all prenatal visits as told by your health care provider. This is important. This information is not intended to replace advice given to you by your health care provider. Make sure you discuss any questions you have with your health care provider. Document Revised: 06/30/2018 Document Reviewed: 04/13/2016 [employer] Patient Education  2020 [employer]   Contraception Choices Contraception, also called birth control, refers to methods or devices that prevent pregnancy. Hormonal methods Contraceptive implant  A contraceptive implant is a thin, plastic tube that contains a hormone. It is inserted into the upper part of the arm. It can remain in place for up to 3 years. Progestin-only injections Progestin-only injections are injections of progestin, a synthetic form of the hormone progesterone. They are given every 3 months by a health care provider. Birth control pills  Birth control pills are pills that contain hormones that prevent pregnancy. They must be taken once a day, preferably at the same time each day. Birth control patch  The birth control patch  contains hormones that prevent pregnancy. It is placed on the skin and must be changed once a week for three weeks and removed on the fourth week. A prescription is needed to use this method of contraception. Vaginal ring  A vaginal ring contains hormones that prevent pregnancy. It is placed in the vagina for three weeks and removed on the fourth week. After that, the process is repeated with a new ring. A prescription is needed to use this method of contraception. Emergency contraceptive Emergency contraceptives prevent pregnancy after unprotected sex. They come in pill form and can be taken up to 5 days after sex. They work best the sooner they are taken after having sex. Most emergency contraceptives are available   without a prescription. This method should not be used as your only form of birth control. Barrier methods Female condom  A female condom is a thin sheath that is worn over the penis during sex. Condoms keep sperm from going inside a woman's body. They can be used with a spermicide to increase their effectiveness. They should be disposed after a single use. Female condom  A female condom is a soft, loose-fitting sheath that is put into the vagina before sex. The condom keeps sperm from going inside a woman's body. They should be disposed after a single use. Diaphragm  A diaphragm is a soft, dome-shaped barrier. It is inserted into the vagina before sex, along with a spermicide. The diaphragm blocks sperm from entering the uterus, and the spermicide kills sperm. A diaphragm should be left in the vagina for 6-8 hours after sex and removed within 24 hours. A diaphragm is prescribed and fitted by a health care provider. A diaphragm should be replaced every 1-2 years, after giving birth, after gaining more than 15 lb (6.8 kg), and after pelvic surgery. Cervical cap  A cervical cap is a round, soft latex or plastic cup that fits over the cervix. It is inserted into the vagina before sex, along  with spermicide. It blocks sperm from entering the uterus. The cap should be left in place for 6-8 hours after sex and removed within 48 hours. A cervical cap must be prescribed and fitted by a health care provider. It should be replaced every 2 years. Sponge  A sponge is a soft, circular piece of polyurethane foam with spermicide on it. The sponge helps block sperm from entering the uterus, and the spermicide kills sperm. To use it, you make it wet and then insert it into the vagina. It should be inserted before sex, left in for at least 6 hours after sex, and removed and thrown away within 30 hours. Spermicides Spermicides are chemicals that kill or block sperm from entering the cervix and uterus. They can come as a cream, jelly, suppository, foam, or tablet. A spermicide should be inserted into the vagina with an applicator at least 10-15 minutes before sex to allow time for it to work. The process must be repeated every time you have sex. Spermicides do not require a prescription. Intrauterine contraception Intrauterine device (IUD) An IUD is a T-shaped device that is put in a woman's uterus. There are two types:  Hormone IUD.This type contains progestin, a synthetic form of the hormone progesterone. This type can stay in place for 3-5 years.  Copper IUD.This type is wrapped in copper wire. It can stay in place for 10 years.  Permanent methods of contraception Female tubal ligation In this method, a woman's fallopian tubes are sealed, tied, or blocked during surgery to prevent eggs from traveling to the uterus. Hysteroscopic sterilization In this method, a small, flexible insert is placed into each fallopian tube. The inserts cause scar tissue to form in the fallopian tubes and block them, so sperm cannot reach an egg. The procedure takes about 3 months to be effective. Another form of birth control must be used during those 3 months. Female sterilization This is a procedure to tie off the  tubes that carry sperm (vasectomy). After the procedure, the man can still ejaculate fluid (semen). Natural planning methods Natural family planning In this method, a couple does not have sex on days when the woman could become pregnant. Calendar method This means keeping track of the length   of each menstrual cycle, identifying the days when pregnancy can happen, and not having sex on those days. Ovulation method In this method, a couple avoids sex during ovulation. Symptothermal method This method involves not having sex during ovulation. The woman typically checks for ovulation by watching changes in her temperature and in the consistency of cervical mucus. Post-ovulation method In this method, a couple waits to have sex until after ovulation. Summary  Contraception, also called birth control, means methods or devices that prevent pregnancy.  Hormonal methods of contraception include implants, injections, pills, patches, vaginal rings, and emergency contraceptives.  Barrier methods of contraception can include female condoms, female condoms, diaphragms, cervical caps, sponges, and spermicides.  There are two types of IUDs (intrauterine devices). An IUD can be put in a woman's uterus to prevent pregnancy for 3-5 years.  Permanent sterilization can be done through a procedure for males, females, or both.  Natural family planning methods involve not having sex on days when the woman could become pregnant. This information is not intended to replace advice given to you by your health care provider. Make sure you discuss any questions you have with your health care provider. Document Revised: 03/10/2017 Document Reviewed: 04/10/2016 [employer] Patient Education  2020 [employer]   Breastfeeding  Choosing to breastfeed is one of the best decisions you can make for yourself and your baby. A change in hormones during pregnancy causes your breasts to make breast milk in your milk-producing  glands. Hormones prevent breast milk from being released before your baby is born. They also prompt milk flow after birth. Once breastfeeding has begun, thoughts of your baby, as well as his or her sucking or crying, can stimulate the release of milk from your milk-producing glands. Benefits of breastfeeding Research shows that breastfeeding offers many health benefits for infants and mothers. It also offers a cost-free and convenient way to feed your baby. For your baby  Your first milk (colostrum) helps your baby's digestive system to function better.  Special cells in your milk (antibodies) help your baby to fight off infections.  Breastfed babies are less likely to develop asthma, allergies, obesity, or type 2 diabetes. They are also at lower risk for sudden infant death syndrome (SIDS).  Nutrients in breast milk are better able to meet your baby's needs compared to infant formula.  Breast milk improves your baby's brain development. For you  Breastfeeding helps to create a very special bond between you and your baby.  Breastfeeding is convenient. Breast milk costs nothing and is always available at the correct temperature.  Breastfeeding helps to burn calories. It helps you to lose the weight that you gained during pregnancy.  Breastfeeding makes your uterus return faster to its size before pregnancy. It also slows bleeding (lochia) after you give birth.  Breastfeeding helps to lower your risk of developing type 2 diabetes, osteoporosis, rheumatoid arthritis, cardiovascular disease, and breast, ovarian, uterine, and endometrial cancer later in life. Breastfeeding basics Starting breastfeeding  Find a comfortable place to sit or lie down, with your neck and back well-supported.  Place a pillow or a rolled-up blanket under your baby to bring him or her to the level of your breast (if you are seated). Nursing pillows are specially designed to help support your arms and your baby while  you breastfeed.  Make sure that your baby's tummy (abdomen) is facing your abdomen.  Gently massage your breast. With your fingertips, massage from the outer edges of your breast inward toward   the nipple. This encourages milk flow. If your milk flows slowly, you may need to continue this action during the feeding.  Support your breast with 4 fingers underneath and your thumb above your nipple (make the letter "C" with your hand). Make sure your fingers are well away from your nipple and your baby's mouth.  Stroke your baby's lips gently with your finger or nipple.  When your baby's mouth is open wide enough, quickly bring your baby to your breast, placing your entire nipple and as much of the areola as possible into your baby's mouth. The areola is the colored area around your nipple. ? More areola should be visible above your baby's upper lip than below the lower lip. ? Your baby's lips should be opened and extended outward (flanged) to ensure an adequate, comfortable latch. ? Your baby's tongue should be between his or her lower gum and your breast.  Make sure that your baby's mouth is correctly positioned around your nipple (latched). Your baby's lips should create a seal on your breast and be turned out (everted).  It is common for your baby to suck about 2-3 minutes in order to start the flow of breast milk. Latching Teaching your baby how to latch onto your breast properly is very important. An improper latch can cause nipple pain, decreased milk supply, and poor weight gain in your baby. Also, if your baby is not latched onto your nipple properly, he or she may swallow some air during feeding. This can make your baby fussy. Burping your baby when you switch breasts during the feeding can help to get rid of the air. However, teaching your baby to latch on properly is still the best way to prevent fussiness from swallowing air while breastfeeding. Signs that your baby has successfully  latched onto your nipple  Silent tugging or silent sucking, without causing you pain. Infant's lips should be extended outward (flanged).  Swallowing heard between every 3-4 sucks once your milk has started to flow (after your let-down milk reflex occurs).  Muscle movement above and in front of his or her ears while sucking. Signs that your baby has not successfully latched onto your nipple  Sucking sounds or smacking sounds from your baby while breastfeeding.  Nipple pain. If you think your baby has not latched on correctly, slip your finger into the corner of your baby's mouth to break the suction and place it between your baby's gums. Attempt to start breastfeeding again. Signs of successful breastfeeding Signs from your baby  Your baby will gradually decrease the number of sucks or will completely stop sucking.  Your baby will fall asleep.  Your baby's body will relax.  Your baby will retain a small amount of milk in his or her mouth.  Your baby will let go of your breast by himself or herself. Signs from you  Breasts that have increased in firmness, weight, and size 1-3 hours after feeding.  Breasts that are softer immediately after breastfeeding.  Increased milk volume, as well as a change in milk consistency and color by the fifth day of breastfeeding.  Nipples that are not sore, cracked, or bleeding. Signs that your baby is getting enough milk  Wetting at least 1-2 diapers during the first 24 hours after birth.  Wetting at least 5-6 diapers every 24 hours for the first week after birth. The urine should be clear or pale yellow by the age of 5 days.  Wetting 6-8 diapers every 24 hours as   your baby continues to grow and develop.  At least 3 stools in a 24-hour period by the age of 5 days. The stool should be soft and yellow.  At least 3 stools in a 24-hour period by the age of 7 days. The stool should be seedy and yellow.  No loss of weight greater than 10% of  birth weight during the first 3 days of life.  Average weight gain of 4-7 oz (113-198 g) per week after the age of 4 days.  Consistent daily weight gain by the age of 5 days, without weight loss after the age of 2 weeks. After a feeding, your baby may spit up a small amount of milk. This is normal. Breastfeeding frequency and duration Frequent feeding will help you make more milk and can prevent sore nipples and extremely full breasts (breast engorgement). Breastfeed when you feel the need to reduce the fullness of your breasts or when your baby shows signs of hunger. This is called "breastfeeding on demand." Signs that your baby is hungry include:  Increased alertness, activity, or restlessness.  Movement of the head from side to side.  Opening of the mouth when the corner of the mouth or cheek is stroked (rooting).  Increased sucking sounds, smacking lips, cooing, sighing, or squeaking.  Hand-to-mouth movements and sucking on fingers or hands.  Fussing or crying. Avoid introducing a pacifier to your baby in the first 4-6 weeks after your baby is born. After this time, you may choose to use a pacifier. Research has shown that pacifier use during the first year of a baby's life decreases the risk of sudden infant death syndrome (SIDS). Allow your baby to feed on each breast as long as he or she wants. When your baby unlatches or falls asleep while feeding from the first breast, offer the second breast. Because newborns are often sleepy in the first few weeks of life, you may need to awaken your baby to get him or her to feed. Breastfeeding times will vary from baby to baby. However, the following rules can serve as a guide to help you make sure that your baby is properly fed:  Newborns (babies 4 weeks of age or younger) may breastfeed every 1-3 hours.  Newborns should not go without breastfeeding for longer than 3 hours during the day or 5 hours during the night.  You should breastfeed  your baby a minimum of 8 times in a 24-hour period. Breast milk pumping     Pumping and storing breast milk allows you to make sure that your baby is exclusively fed your breast milk, even at times when you are unable to breastfeed. This is especially important if you go back to work while you are still breastfeeding, or if you are not able to be present during feedings. Your lactation consultant can help you find a method of pumping that works best for you and give you guidelines about how long it is safe to store breast milk. Caring for your breasts while you breastfeed Nipples can become dry, cracked, and sore while breastfeeding. The following recommendations can help keep your breasts moisturized and healthy:  Avoid using soap on your nipples.  Wear a supportive bra designed especially for nursing. Avoid wearing underwire-style bras or extremely tight bras (sports bras).  Air-dry your nipples for 3-4 minutes after each feeding.  Use only cotton bra pads to absorb leaked breast milk. Leaking of breast milk between feedings is normal.  Use lanolin on your nipples   after breastfeeding. Lanolin helps to maintain your skin's normal moisture barrier. Pure lanolin is not harmful (not toxic) to your baby. You may also hand express a few drops of breast milk and gently massage that milk into your nipples and allow the milk to air-dry. In the first few weeks after giving birth, some women experience breast engorgement. Engorgement can make your breasts feel heavy, warm, and tender to the touch. Engorgement peaks within 3-5 days after you give birth. The following recommendations can help to ease engorgement:  Completely empty your breasts while breastfeeding or pumping. You may want to start by applying warm, moist heat (in the shower or with warm, water-soaked hand towels) just before feeding or pumping. This increases circulation and helps the milk flow. If your baby does not completely empty your  breasts while breastfeeding, pump any extra milk after he or she is finished.  Apply ice packs to your breasts immediately after breastfeeding or pumping, unless this is too uncomfortable for you. To do this: ? Put ice in a plastic bag. ? Place a towel between your skin and the bag. ? Leave the ice on for 20 minutes, 2-3 times a day.  Make sure that your baby is latched on and positioned properly while breastfeeding. If engorgement persists after 48 hours of following these recommendations, contact your health care provider or a lactation consultant. Overall health care recommendations while breastfeeding  Eat 3 healthy meals and 3 snacks every day. Well-nourished mothers who are breastfeeding need an additional 450-500 calories a day. You can meet this requirement by increasing the amount of a balanced diet that you eat.  Drink enough water to keep your urine pale yellow or clear.  Rest often, relax, and continue to take your prenatal vitamins to prevent fatigue, stress, and low vitamin and mineral levels in your body (nutrient deficiencies).  Do not use any products that contain nicotine or tobacco, such as cigarettes and e-cigarettes. Your baby may be harmed by chemicals from cigarettes that pass into breast milk and exposure to secondhand smoke. If you need help quitting, ask your health care provider.  Avoid alcohol.  Do not use illegal drugs or marijuana.  Talk with your health care provider before taking any medicines. These include over-the-counter and prescription medicines as well as vitamins and herbal supplements. Some medicines that may be harmful to your baby can pass through breast milk.  It is possible to become pregnant while breastfeeding. If birth control is desired, ask your health care provider about options that will be safe while breastfeeding your baby. Where to find more information: [employer]: www.llli.org Contact a health care provider  if:  You feel like you want to stop breastfeeding or have become frustrated with breastfeeding.  Your nipples are cracked or bleeding.  Your breasts are red, tender, or warm.  You have: ? Painful breasts or nipples. ? A swollen area on either breast. ? A fever or chills. ? Nausea or vomiting. ? Drainage other than breast milk from your nipples.  Your breasts do not become full before feedings by the fifth day after you give birth.  You feel sad and depressed.  Your baby is: ? Too sleepy to eat well. ? Having trouble sleeping. ? More than 1 week old and wetting fewer than 6 diapers in a 24-hour period. ? Not gaining weight by 5 days of age.  Your baby has fewer than 3 stools in a 24-hour period.  Your baby's skin or   the white parts of his or her eyes become yellow. Get help right away if:  Your baby is overly tired (lethargic) and does not want to wake up and feed.  Your baby develops an unexplained fever. Summary  Breastfeeding offers many health benefits for infant and mothers.  Try to breastfeed your infant when he or she shows early signs of hunger.  Gently tickle or stroke your baby's lips with your finger or nipple to allow the baby to open his or her mouth. Bring the baby to your breast. Make sure that much of the areola is in your baby's mouth. Offer one side and burp the baby before you offer the other side.  Talk with your health care provider or lactation consultant if you have questions or you face problems as you breastfeed. This information is not intended to replace advice given to you by your health care provider. Make sure you discuss any questions you have with your health care provider. Document Revised: 06/02/2017 Document Reviewed: 04/09/2016 Elsevier Patient Education  2020 Elsevier Inc.  

## 2020-03-07 NOTE — Progress Notes (Signed)
Last Makena given 2 weeks prior. Pt states she discontinued because the lump on her arm from injection site never went away.   Apolonio Schneiders RN 03/07/20

## 2020-03-17 ENCOUNTER — Other Ambulatory Visit: Payer: Self-pay

## 2020-03-17 ENCOUNTER — Ambulatory Visit (INDEPENDENT_AMBULATORY_CARE_PROVIDER_SITE_OTHER): Payer: Medicaid Other | Admitting: *Deleted

## 2020-03-17 VITALS — BP 111/77 | HR 77 | Ht 67.0 in

## 2020-03-17 DIAGNOSIS — O0990 Supervision of high risk pregnancy, unspecified, unspecified trimester: Secondary | ICD-10-CM

## 2020-03-17 DIAGNOSIS — R3915 Urgency of urination: Secondary | ICD-10-CM | POA: Diagnosis not present

## 2020-03-17 DIAGNOSIS — O99891 Other specified diseases and conditions complicating pregnancy: Secondary | ICD-10-CM

## 2020-03-17 DIAGNOSIS — M549 Dorsalgia, unspecified: Secondary | ICD-10-CM

## 2020-03-17 LAB — POCT URINALYSIS DIP (DEVICE)
Bilirubin Urine: NEGATIVE
Glucose, UA: NEGATIVE mg/dL
Ketones, ur: NEGATIVE mg/dL
Nitrite: NEGATIVE
Protein, ur: NEGATIVE mg/dL
Specific Gravity, Urine: 1.025 (ref 1.005–1.030)
Urobilinogen, UA: 0.2 mg/dL (ref 0.0–1.0)
pH: 7 (ref 5.0–8.0)

## 2020-03-17 MED ORDER — NITROFURANTOIN MONOHYD MACRO 100 MG PO CAPS: 100.0000 mg | ORAL_CAPSULE | Freq: Two times a day (BID) | ORAL | 0 refills | Status: DC

## 2020-03-17 NOTE — Progress Notes (Signed)
Pt reports having RLQ abdominal pain, back pain and urinary urgency. She also has pressure while voiding - denies dysuria. This has been happening for 4-5 days. FHR - 142 per doppler. Urinalysis shows trace hemoglobin and small Leukocytes. Per consult w/Dr. Debroah Loop, pt was advised of need for Rx Macrobid was this sent to her pharmacy. Pt had other concerns as well. She asked to speak with office manager regarding her FMLA paperwork. I advised that I will send a message to the manager on her behalf. She stated that she had a problem with the Makena injection that she had a month or so ago due to "knots" in her arm where injection was placed. She wanted to know what other options she may have instead of Makena. I advised that I will send a message to the provider she saw in the office last (Dr. Crissie Reese on 12/17). Pt voiced understanding of all information and instructions given.

## 2020-03-19 LAB — CULTURE, OB URINE

## 2020-03-19 LAB — URINE CULTURE, OB REFLEX

## 2020-03-20 ENCOUNTER — Telehealth: Payer: Self-pay | Admitting: Family Medicine

## 2020-03-20 NOTE — Telephone Encounter (Signed)
Pt called in stating that she came in [place] for poss. UTI and that she was prescribed meds for that and not she has results on [facility] that state she was neg for UTI and wants to know why she was given the meds. Pt states nurse told her that she HAD UTI. Pt wants a call back ASAP. thanks

## 2020-03-20 NOTE — Telephone Encounter (Signed)
Returned patient call. Reviewed with her that she was treated on the day that she was in the office based on symptoms and chose not to wait for .   She is having a milky white vaginal discharge that has been there in the last 1.5 weeks. She does not detect an unusual odor.   She is having pain on the right side, she feels like she has urgency with voiding. She is having some vaginal irritation where she feels raw. She denies any burning ot itching. She did not have a vaginal swab at her last appointment.   Spoke with Dr. Donavan Foil who recommends she have another urine check at her next appt and to send to Urology for evaluation.   Patient reports she would like to have an appointment to have her urine and vaginal swab. Patient to call the front office to schedule.

## 2020-03-22 NOTE — L&D Delivery Note (Signed)
  Post-Placental IUD Insertion Procedure Note  Patient identified, informed consent signed prior to delivery, signed copy in chart, time out was performed.    Vaginal, labial and perineal areas thoroughly inspected for lacerations. No lacerations noted.  Liletta - IUD grasped between sterile gloved fingers. Sterile lubrication applied to sterile gloved hand for ease of insertion. Fundus identified through abdominal wall using non-insertion hand. IUD inserted to fundus with bimanual technique. IUD carefully released at the fundus and insertion hand gently removed from vagina.     Strings trimmed to the level of the introitus. Patient tolerated procedure well.  Lot # [id number] Expiration Date 07/21/23  Patient given post procedure instructions and IUD care card with expiration date.  Patient is asked to keep IUD strings tucked in her vagina until her postpartum follow up visit in 4-6 weeks. Patient advised to abstain from sexual intercourse and pulling on strings before her follow-up visit. Patient verbalized an understanding of the plan of care and agrees.

## 2020-03-22 NOTE — L&D Delivery Note (Signed)
OB/[facility]N [facility] Practice Delivery Note  Annette Greene is a 30 y.o. [id number] s/p SVD at [redacted]w[redacted]d. She was admitted for SROM/Labor.   ROM: 9h 63m with clear fluid GBS Status:  Negative/-- (04/04 1614) Maximum Maternal Temperature: 9  Labor Progress: . Initial SVE: 98.7. She then progressed to complete.   Delivery Date/Time: [id number] on 4/14 Delivery: Called to room and patient was complete and pushing. Head delivered LOA. No nuchal cord present. Compound hand presentation and slow delivery of shoulder.  Patient placed in [place] and body delivered. Infant with spontaneous cry, placed on mother's abdomen, dried and stimulated. Cord clamped x 2 after 1-minute delay, and cut by patient. Cord blood drawn. Placenta delivered spontaneously with gentle cord traction. Fundus firm with massage and Pitocin. Labia, perineum, vagina, and cervix inspected inspected with no lacerations. Post Placental IUD inserted, see separate note for details.  Baby Weight: pending  Placenta: Sent to L&D Complications: None Lacerations: none EBL: 110 mL Analgesia: Epidural   Infant:  APGAR (1 MIN): 9   APGAR (5 MINS): 9   APGAR (10 MINS):     Sharene Skeans, MD [facility] Family Medicine Fellow, [facility] 07/03/2020, 8:02 AM

## 2020-03-23 ENCOUNTER — Other Ambulatory Visit: Payer: Self-pay

## 2020-03-23 ENCOUNTER — Inpatient Hospital Stay (HOSPITAL_COMMUNITY)
Admission: AD | Admit: 2020-03-23 | Discharge: 2020-03-23 | Disposition: A | Discharge status: Home / Self Care | Payer: Medicaid Other | Attending: Obstetrics and Gynecology | Admitting: Obstetrics and Gynecology

## 2020-03-23 DIAGNOSIS — O26892 Other specified pregnancy related conditions, second trimester: Secondary | ICD-10-CM

## 2020-03-23 DIAGNOSIS — Z3A22 22 weeks gestation of pregnancy: Secondary | ICD-10-CM | POA: Diagnosis not present

## 2020-03-23 DIAGNOSIS — N760 Acute vaginitis: Secondary | ICD-10-CM

## 2020-03-23 DIAGNOSIS — R319 Hematuria, unspecified: Secondary | ICD-10-CM | POA: Diagnosis not present

## 2020-03-23 DIAGNOSIS — O99891 Other specified diseases and conditions complicating pregnancy: Secondary | ICD-10-CM | POA: Diagnosis not present

## 2020-03-23 DIAGNOSIS — B9689 Other specified bacterial agents as the cause of diseases classified elsewhere: Secondary | ICD-10-CM

## 2020-03-23 DIAGNOSIS — O3432 Maternal care for cervical incompetence, second trimester: Secondary | ICD-10-CM | POA: Diagnosis not present

## 2020-03-23 DIAGNOSIS — R102 Pelvic and perineal pain: Secondary | ICD-10-CM | POA: Diagnosis not present

## 2020-03-23 DIAGNOSIS — Z87891 Personal history of nicotine dependence: Secondary | ICD-10-CM | POA: Diagnosis not present

## 2020-03-23 DIAGNOSIS — O26899 Other specified pregnancy related conditions, unspecified trimester: Secondary | ICD-10-CM

## 2020-03-23 DIAGNOSIS — N898 Other specified noninflammatory disorders of vagina: Secondary | ICD-10-CM

## 2020-03-23 LAB — URINALYSIS, ROUTINE W REFLEX MICROSCOPIC
Bacteria, UA: NONE SEEN
Bilirubin Urine: NEGATIVE
Glucose, UA: NEGATIVE mg/dL
Ketones, ur: NEGATIVE mg/dL
Leukocytes,Ua: NEGATIVE
Nitrite: NEGATIVE
Protein, ur: NEGATIVE mg/dL
Specific Gravity, Urine: 1.024 (ref 1.005–1.030)
pH: 6 (ref 5.0–8.0)

## 2020-03-23 LAB — WET PREP, GENITAL
Clue Cells Wet Prep HPF POC: NONE SEEN
Sperm: NONE SEEN
Trich, Wet Prep: NONE SEEN
Yeast Wet Prep HPF POC: NONE SEEN

## 2020-03-23 MED ORDER — PHENAZOPYRIDINE HCL 200 MG PO TABS: 200.0000 mg | ORAL_TABLET | Freq: Three times a day (TID) | ORAL | 0 refills | Status: DC | PRN

## 2020-03-23 MED ORDER — METRONIDAZOLE 500 MG PO TABS: 500.0000 mg | ORAL_TABLET | Freq: Two times a day (BID) | ORAL | 0 refills | Status: DC

## 2020-03-23 NOTE — MAU Note (Signed)
Annette Greene is a 30 y.o. at [redacted]w[redacted]d here in MAU reporting: pressure and pain in her waist for the past 3.5-4 weeks. States she was evaluated for UTI and her urine test was negative. No bleeding. Having urinary urgency.  Onset of complaint: ongoing  Pain score: 8/10  Vitals:   03/23/20 1217  BP: (!) 124/44  Pulse: 73  Resp: 16  Temp: 98.7 F (37.1 C)  SpO2: 100%     FHT:157  Lab orders placed from triage: UA

## 2020-03-23 NOTE — MAU Provider Note (Signed)
History     CSN: [id number]  Arrival date and time: 03/23/20 1155  Event Date/Time  First Provider Initiated Contact with Patient 03/23/20 1249      Chief Complaint  Patient presents with  . Abdominal Pain  . Urinary Urgency   HPI Annette Greene is a 30 y.o. [id number] at [redacted]w[redacted]d who presents to MAU with chief complaint of pain at her right mid abdomen at the level of her umbilicus. She is s/p cerclage placement at [facility] on 01/17/2020. This is a recurrent complaint, onset 3 or 4 weeks ago. Pain is unchanged since that time. Her pain score is 8/10, her pain does not radiate. She denies aggravating or alleviating factors. She was previously advised to manage her discomfort with Tylenol but states it did not help.  Patient endorses secondary complaint of urinary frequency. This is a recurrent problem. Patient states she reported these symptoms to the office and was prescribed Macrobid but she saw her results on MyChart and discontinued Macrobid after one dose.   Patient denies vaginal bleeding, lower abdominal pain, fever or recent illness. Most recent sexual intercourse about one month ago.  She receives care with [facility].  OB History    Gravida  3   Para  2   Term  1   Preterm  1   AB      Living  2     SAB      IAB      Ectopic      Multiple  0   Live Births  2           Past Medical History:  Diagnosis Date  . Herpes   . Hx of chlamydia infection   . Infection    UTI  . Preterm labor     Past Surgical History:  Procedure Laterality Date  . CERVICAL CERCLAGE N/A 01/25/2018   Procedure: CERCLAGE CERVICAL;  Surgeon: Osborn Coho, MD;  Location: [facility] BIRTHING SUITES;  Service: Gynecology;  Laterality: N/A;  . TONSILLECTOMY AND ADENOIDECTOMY    . WISDOM TOOTH EXTRACTION     all 4 teeth    Family History  Problem Relation Age of Onset  . Healthy Mother   . Hypertension Mother   . Healthy Father   . Healthy Sister   . Healthy Maternal  Grandmother   . Hypertension Maternal Grandmother   . Healthy Paternal Grandmother   . Healthy Paternal Grandfather   . Healthy Sister   . Drug abuse Maternal Aunt   . Hypertension Maternal Aunt     Social History   Tobacco Use  . Smoking status: Former Smoker    Packs/day: 0.50    Types: Cigarettes    Quit date: 06/24/2016    Years since quitting: 3.7  . Smokeless tobacco: Never Used  . Tobacco comment: stopped since pregnant  Vaping Use  . Vaping Use: Never used  Substance Use Topics  . Alcohol use: Not Currently    Comment: stopped with pregnancy  . Drug use: Not Currently    Allergies: No Known Allergies  Medications Prior to Admission  Medication Sig Dispense Refill Last Dose  . acetaminophen (TYLENOL) 500 MG tablet Take 1,000 mg by mouth every 6 (six) hours as needed.     . calcium carbonate (TUMS - DOSED IN MG ELEMENTAL CALCIUM) 500 MG chewable tablet Chew 1 tablet by mouth daily. (Patient not taking: Reported on 03/17/2020)     . cyclobenzaprine (FLEXERIL) 10 MG tablet Take  1 tablet (10 mg total) by mouth every 8 (eight) hours as needed for muscle spasms. (Patient not taking: Reported on 03/17/2020) 30 tablet 1   . MAKENA autoinjector Inject into the skin. (Patient not taking: Reported on 03/17/2020)     . nitrofurantoin, macrocrystal-monohydrate, (MACROBID) 100 MG capsule Take 1 capsule (100 mg total) by mouth 2 (two) times daily. 14 capsule 0   . Prenatal Vit-Fe Fumarate-FA (PRENATAL VITAMIN) 27-0.8 MG TABS Prenatal Vitamin       Review of Systems  Gastrointestinal: Positive for abdominal pain.  Genitourinary: Positive for urgency. Negative for vaginal bleeding.  Musculoskeletal: Negative for back pain.  All other systems reviewed and are negative.  Physical Exam   Blood pressure (!) 124/44, pulse 73, temperature 98.7 F (37.1 C), temperature source Oral, resp. rate 16, height 5\' 7"  (1.702 m), weight 114 kg, last menstrual period 10/03/2019, SpO2 100 %,  currently breastfeeding.  Physical Exam Vitals and nursing note reviewed. Exam conducted with a chaperone present.  Constitutional:      Appearance: She is well-developed.  Abdominal:     Tenderness: There is no abdominal tenderness.  Genitourinary:    Comments: Pelvic exam: External genitalia normal, vaginal walls pink and well rugated, cervix visually closed, no lesions noted. Cerclage in place, no tension against cerclage. No bleeding noted. Thin white discharge, scant areas of frothy discharge near cervical os. Will collect swabs, initiate treatment for BV  Skin:    General: Skin is warm.     Capillary Refill: Capillary refill takes less than 2 seconds.  Neurological:     Mental Status: She is alert and oriented to person, place, and time.    MAU Course  Procedures  --Treat presumptively for Bacterial Vaginosis based on physical exam --Reviewed notes in medical record for previous Macrobid prescription. Patient endorses feeling better after 1 dose of Macrobid. Culture ordered. Offered Pyridium for intermittent dysuria.   Orders Placed This Encounter  Procedures  . Wet prep, genital  . Urinalysis, Routine w reflex microscopic Urine, Clean Catch      Patient Vitals for the past 24 hrs:  BP Temp Temp src Pulse Resp SpO2 Height Weight  03/23/20 1217 (!) 124/44 98.7 F (37.1 C) Oral 73 16 100 % - -  03/23/20 1212 - - - - - - 5\' 7"  (1.702 m) 114 kg   Results for orders placed or performed during the hospital encounter of 03/23/20 (from the past 24 hour(s))  Urinalysis, Routine w reflex microscopic Urine, Clean Catch     Status: Abnormal   Collection Time: 03/23/20 12:05 PM  Result Value Ref Range   Color, Urine YELLOW YELLOW   APPearance CLEAR CLEAR   Specific Gravity, Urine 1.024 1.005 - 1.030   pH 6.0 5.0 - 8.0   Glucose, UA NEGATIVE NEGATIVE mg/dL   Hgb urine dipstick SMALL (A) NEGATIVE   Bilirubin Urine NEGATIVE NEGATIVE   Ketones, ur NEGATIVE NEGATIVE mg/dL    Protein, ur NEGATIVE NEGATIVE mg/dL   Nitrite NEGATIVE NEGATIVE   Leukocytes,Ua NEGATIVE NEGATIVE   RBC / HPF 0-5 0 - 5 RBC/hpf   WBC, UA 0-5 0 - 5 WBC/hpf   Bacteria, UA NONE SEEN NONE SEEN   Squamous Epithelial / LPF 0-5 0 - 5   Mucus PRESENT   Wet prep, genital     Status: Abnormal   Collection Time: 03/23/20  1:19 PM   Specimen: PATH Cytology Cervicovaginal Ancillary Only  Result Value Ref Range   Yeast Wet Prep  HPF POC NONE SEEN NONE SEEN   Trich, Wet Prep NONE SEEN NONE SEEN   Clue Cells Wet Prep HPF POC NONE SEEN NONE SEEN   WBC, Wet Prep HPF POC MANY (A) NONE SEEN   Sperm NONE SEEN    Meds ordered this encounter  Medications  . metroNIDAZOLE (FLAGYL) 500 MG tablet    Sig: Take 1 tablet (500 mg total) by mouth 2 (two) times daily.    Dispense:  14 tablet    Refill:  0    Order Specific Question:   Supervising Provider    Answer:   Sloan Leiter [id number]  . phenazopyridine (PYRIDIUM) 200 MG tablet    Sig: Take 1 tablet (200 mg total) by mouth 3 (three) times daily as needed for pain.    Dispense:  10 tablet    Refill:  0    Order Specific Question:   Supervising Provider    Answer:   Sloan Leiter [id number]   Assessment and Plan  --30 y.o. G3P1102 at [redacted]w[redacted]d  --[place] by Doppler --Cerclage in place --Hematuria, urine culture in work --Treat presumptively for BV based on physical exam --GC/C in work --Discharge home in stable condition  F/U: --Next appointment with Kraemer is 03/28/2020  Darlina Rumpf, [place] 03/23/2020, 3:41 PM

## 2020-03-23 NOTE — Discharge Instructions (Signed)
Round Ligament Pain  The round ligament is a cord of muscle and tissue that helps support the uterus. It can become a source of pain during pregnancy if it becomes stretched or twisted as the baby grows. The pain usually begins in the second trimester (13-28 weeks) of pregnancy, and it can come and go until the baby is delivered. It is not a serious problem, and it does not cause harm to the baby. Round ligament pain is usually a short, sharp, and pinching pain, but it can also be a dull, lingering, and aching pain. The pain is felt in the lower side of the abdomen or in the groin. It usually starts deep in the groin and moves up to the outside of the hip area. The pain may occur when you:  Suddenly change position, such as quickly going from a sitting to standing position.  Roll over in bed.  Cough or sneeze.  Do physical activity. Follow these instructions at home:   Watch your condition for any changes.  When the pain starts, relax. Then try any of these methods to help with the pain: ? Sitting down. ? Flexing your knees up to your abdomen. ? Lying on your side with one pillow under your abdomen and another pillow between your legs. ? Sitting in a warm bath for 15-20 minutes or until the pain goes away.  Take over-the-counter and prescription medicines only as told by your health care provider.  Move slowly when you sit down or stand up.  Avoid long walks if they cause pain.  Stop or reduce your physical activities if they cause pain.  Keep all follow-up visits as told by your health care provider. This is important. Contact a health care provider if:  Your pain does not go away with treatment.  You feel pain in your back that you did not have before.  Your medicine is not helping. Get help right away if:  You have a fever or chills.  You develop uterine contractions.  You have vaginal bleeding.  You have nausea or vomiting.  You have diarrhea.  You have pain  when you urinate. Summary  Round ligament pain is felt in the lower abdomen or groin. It is usually a short, sharp, and pinching pain. It can also be a dull, lingering, and aching pain.  This pain usually begins in the second trimester (13-28 weeks). It occurs because the uterus is stretching with the growing baby, and it is not harmful to the baby.  You may notice the pain when you suddenly change position, when you cough or sneeze, or during physical activity.  Relaxing, flexing your knees to your abdomen, lying on one side, or taking a warm bath may help to get rid of the pain.  Get help from your health care provider if the pain does not go away or if you have vaginal bleeding, nausea, vomiting, diarrhea, or painful urination. This information is not intended to replace advice given to you by your health care provider. Make sure you discuss any questions you have with your health care provider. Document Revised: 08/24/2017 Document Reviewed: 08/24/2017 Elsevier Patient Education  2020 [employer]    Bacterial Vaginosis  Bacterial vaginosis is an infection of the vagina. It happens when too many normal germs (healthy bacteria) grow in the vagina. This infection puts you at risk for infections from sex (STIs). Treating this infection can lower your risk for some STIs. You should also treat this if you are  pregnant. It can cause your baby to be born early. Follow these instructions at home: Medicines  Take over-the-counter and prescription medicines only as told by your doctor.  Take or use your antibiotic medicine as told by your doctor. Do not stop taking or using it even if you start to feel better. General instructions  If you your sexual partner is a woman, tell her that you have this infection. She needs to get treatment if she has symptoms. If you have a female partner, he does not need to be treated.  During treatment: ? Avoid sex. ? Do not douche. ? Avoid alcohol as  told. ? Avoid breastfeeding as told.  Drink enough fluid to keep your pee (urine) clear or pale yellow.  Keep your vagina and butt (rectum) clean. ? Wash the area with warm water every day. ? Wipe from front to back after you use the toilet.  Keep all follow-up visits as told by your doctor. This is important. Preventing this condition  Do not douche.  Use only warm water to wash around your vagina.  Use protection when you have sex. This includes: ? Latex condoms. ? Dental dams.  Limit how many people you have sex with. It is best to only have sex with the same person (be monogamous).  Get tested for STIs. Have your partner get tested.  Wear underwear that is cotton or lined with cotton.  Avoid tight pants and pantyhose. This is most important in summer.  Do not use any products that have nicotine or tobacco in them. These include cigarettes and e-cigarettes. If you need help quitting, ask your doctor.  Do not use illegal drugs.  Limit how much alcohol you drink. Contact a doctor if:  Your symptoms do not get better, even after you are treated.  You have more discharge or pain when you pee (urinate).  You have a fever.  You have pain in your belly (abdomen).  You have pain with sex.  Your bleed from your vagina between periods. Summary  This infection happens when too many germs (bacteria) grow in the vagina.  Treating this condition can lower your risk for some infections from sex (STIs).  You should also treat this if you are pregnant. It can cause early (premature) birth.  Do not stop taking or using your antibiotic medicine even if you start to feel better. This information is not intended to replace advice given to you by your health care provider. Make sure you discuss any questions you have with your health care provider. Document Revised: 02/18/2017 Document Reviewed: 11/22/2015 Elsevier Patient Education  2020 [employer].

## 2020-03-24 LAB — GC/CHLAMYDIA PROBE AMP (~~LOC~~) NOT AT ARMC
Chlamydia: NEGATIVE
Comment: NEGATIVE
Comment: NORMAL
Neisseria Gonorrhea: NEGATIVE

## 2020-03-28 ENCOUNTER — Encounter: Payer: Medicaid Other | Admitting: Family Medicine

## 2020-04-04 ENCOUNTER — Encounter: Payer: Medicaid Other | Admitting: Family Medicine

## 2020-04-11 ENCOUNTER — Other Ambulatory Visit: Payer: Self-pay

## 2020-04-11 ENCOUNTER — Ambulatory Visit: Payer: Medicaid Other | Admitting: *Deleted

## 2020-04-11 ENCOUNTER — Encounter: Payer: Self-pay | Admitting: *Deleted

## 2020-04-11 ENCOUNTER — Ambulatory Visit: Payer: Medicaid Other | Attending: Family Medicine

## 2020-04-11 ENCOUNTER — Other Ambulatory Visit: Payer: Self-pay | Admitting: *Deleted

## 2020-04-11 VITALS — BP 112/57 | HR 79

## 2020-04-11 DIAGNOSIS — O10912 Unspecified pre-existing hypertension complicating pregnancy, second trimester: Secondary | ICD-10-CM

## 2020-04-11 DIAGNOSIS — Z8751 Personal history of pre-term labor: Secondary | ICD-10-CM

## 2020-04-11 DIAGNOSIS — O0990 Supervision of high risk pregnancy, unspecified, unspecified trimester: Secondary | ICD-10-CM

## 2020-04-15 ENCOUNTER — Telehealth (INDEPENDENT_AMBULATORY_CARE_PROVIDER_SITE_OTHER): Payer: Medicaid Other | Admitting: Family Medicine

## 2020-04-15 ENCOUNTER — Other Ambulatory Visit: Payer: Self-pay

## 2020-04-15 ENCOUNTER — Encounter: Payer: Self-pay | Admitting: Family Medicine

## 2020-04-15 VITALS — BP 129/56 | HR 82

## 2020-04-15 DIAGNOSIS — O0990 Supervision of high risk pregnancy, unspecified, unspecified trimester: Secondary | ICD-10-CM

## 2020-04-15 DIAGNOSIS — O3432 Maternal care for cervical incompetence, second trimester: Secondary | ICD-10-CM

## 2020-04-15 DIAGNOSIS — O99212 Obesity complicating pregnancy, second trimester: Secondary | ICD-10-CM

## 2020-04-15 DIAGNOSIS — Z3A26 26 weeks gestation of pregnancy: Secondary | ICD-10-CM

## 2020-04-15 DIAGNOSIS — R030 Elevated blood-pressure reading, without diagnosis of hypertension: Secondary | ICD-10-CM

## 2020-04-15 DIAGNOSIS — Z8619 Personal history of other infectious and parasitic diseases: Secondary | ICD-10-CM

## 2020-04-15 DIAGNOSIS — O3430 Maternal care for cervical incompetence, unspecified trimester: Secondary | ICD-10-CM

## 2020-04-15 DIAGNOSIS — O99891 Other specified diseases and conditions complicating pregnancy: Secondary | ICD-10-CM

## 2020-04-15 NOTE — Progress Notes (Signed)
Patient states that her Ph balance is off with a milky white discharge., a lot more than normal, with no irritation.

## 2020-04-15 NOTE — Progress Notes (Unsigned)
I connected with  [name] on 04/15/20 at  2:35 PM EST by telephone and verified that I am speaking with the correct person using two identifiers.   I discussed the limitations, risks, security and privacy concerns of performing an evaluation and management service by telephone and the availability of in person appointments. I also discussed with the patient that there may be a patient responsible charge related to this service. The patient expressed understanding and agreed to proceed.  Bethanne Ginger, [place] 04/15/2020  2:49 PM

## 2020-04-16 NOTE — Patient Instructions (Signed)
Contraception Choices Contraception, also called birth control, refers to methods or devices that prevent pregnancy. Hormonal methods Contraceptive implant A contraceptive implant is a thin, plastic tube that contains a hormone that prevents pregnancy. It is different from an intrauterine device (IUD). It is inserted into the upper part of the arm by a health care provider. Implants can be effective for up to 3 years. Progestin-only injections Progestin-only injections are injections of progestin, a synthetic form of the hormone progesterone. They are given every 3 months by a health care provider. Birth control pills Birth control pills are pills that contain hormones that prevent pregnancy. They must be taken once a day, preferably at the same time each day. A prescription is needed to use this method of contraception. Birth control patch The birth control patch contains hormones that prevent pregnancy. It is placed on the skin and must be changed once a week for three weeks and removed on the fourth week. A prescription is needed to use this method of contraception. Vaginal ring A vaginal ring contains hormones that prevent pregnancy. It is placed in the vagina for three weeks and removed on the fourth week. After that, the process is repeated with a new ring. A prescription is needed to use this method of contraception. Emergency contraceptive Emergency contraceptives prevent pregnancy after unprotected sex. They come in pill form and can be taken up to 5 days after sex. They work best the sooner they are taken after having sex. Most emergency contraceptives are available without a prescription. This method should not be used as your only form of birth control.   Barrier methods Female condom A female condom is a thin sheath that is worn over the penis during sex. Condoms keep sperm from going inside a woman's body. They can be used with a sperm-killing substance (spermicide) to increase their  effectiveness. They should be thrown away after one use. Female condom A female condom is a soft, loose-fitting sheath that is put into the vagina before sex. The condom keeps sperm from going inside a woman's body. They should be thrown away after one use. Diaphragm A diaphragm is a soft, dome-shaped barrier. It is inserted into the vagina before sex, along with a spermicide. The diaphragm blocks sperm from entering the uterus, and the spermicide kills sperm. A diaphragm should be left in the vagina for 6-8 hours after sex and removed within 24 hours. A diaphragm is prescribed and fitted by a health care provider. A diaphragm should be replaced every 1-2 years, after giving birth, after gaining more than 15 lb (6.8 kg), and after pelvic surgery. Cervical cap A cervical cap is a round, soft latex or plastic cup that fits over the cervix. It is inserted into the vagina before sex, along with spermicide. It blocks sperm from entering the uterus. The cap should be left in place for 6-8 hours after sex and removed within 48 hours. A cervical cap must be prescribed and fitted by a health care provider. It should be replaced every 2 years. Sponge A sponge is a soft, circular piece of polyurethane foam with spermicide in it. The sponge helps block sperm from entering the uterus, and the spermicide kills sperm. To use it, you make it wet and then insert it into the vagina. It should be inserted before sex, left in for at least 6 hours after sex, and removed and thrown away within 30 hours. Spermicides Spermicides are chemicals that kill or block sperm from entering the  cervix and uterus. They can come as a cream, jelly, suppository, foam, or tablet. A spermicide should be inserted into the vagina with an applicator at least 41-66 minutes before sex to allow time for it to work. The process must be repeated every time you have sex. Spermicides do not require a prescription.   Intrauterine  contraception Intrauterine device (IUD) An IUD is a T-shaped device that is put in a woman's uterus. There are two types:  Hormone IUD.This type contains progestin, a synthetic form of the hormone progesterone. This type can stay in place for 3-5 years.  Copper IUD.This type is wrapped in copper wire. It can stay in place for 10 years. Permanent methods of contraception Female tubal ligation In this method, a woman's fallopian tubes are sealed, tied, or blocked during surgery to prevent eggs from traveling to the uterus. Hysteroscopic sterilization In this method, a small, flexible insert is placed into each fallopian tube. The inserts cause scar tissue to form in the fallopian tubes and block them, so sperm cannot reach an egg. The procedure takes about 3 months to be effective. Another form of birth control must be used during those 3 months. Female sterilization This is a procedure to tie off the tubes that carry sperm (vasectomy). After the procedure, the man can still ejaculate fluid (semen). Another form of birth control must be used for 3 months after the procedure. Natural planning methods Natural family planning In this method, a couple does not have sex on days when the woman could become pregnant. Calendar method In this method, the woman keeps track of the length of each menstrual cycle, identifies the days when pregnancy can happen, and does not have sex on those days. Ovulation method In this method, a couple avoids sex during ovulation. Symptothermal method This method involves not having sex during ovulation. The woman typically checks for ovulation by watching changes in her temperature and in the consistency of cervical mucus. Post-ovulation method In this method, a couple waits to have sex until after ovulation. Where to find more information  Centers for Disease Control and Prevention: http://www.wolf.info/ Summary  Contraception, also called birth control, refers to methods or  devices that prevent pregnancy.  Hormonal methods of contraception include implants, injections, pills, patches, vaginal rings, and emergency contraceptives.  Barrier methods of contraception can include female condoms, female condoms, diaphragms, cervical caps, sponges, and spermicides.  There are two types of IUDs (intrauterine devices). An IUD can be put in a woman's uterus to prevent pregnancy for 3-5 years.  Permanent sterilization can be done through a procedure for males and females. Natural family planning methods involve nothaving sex on days when the woman could become pregnant. This information is not intended to replace advice given to you by your health care provider. Make sure you discuss any questions you have with your health care provider. Document Revised: 08/13/2019 Document Reviewed: 08/13/2019 Elsevier Patient Education  [employer].   Breastfeeding  Choosing to breastfeed is one of the best decisions you can make for yourself and your baby. A change in hormones during pregnancy causes your breasts to make breast milk in your milk-producing glands. Hormones prevent breast milk from being released before your baby is born. They also prompt milk flow after birth. Once breastfeeding has begun, thoughts of your baby, as well as his or her sucking or crying, can stimulate the release of milk from your milk-producing glands. Benefits of breastfeeding Research shows that breastfeeding offers many health benefits  for infants and mothers. It also offers a cost-free and convenient way to feed your baby. For your baby  Your first milk (colostrum) helps your baby's digestive system to function better.  Special cells in your milk (antibodies) help your baby to fight off infections.  Breastfed babies are less likely to develop asthma, allergies, obesity, or type 2 diabetes. They are also at lower risk for sudden infant death syndrome (SIDS).  Nutrients in breast milk are better  able to meet your baby's needs compared to infant formula.  Breast milk improves your baby's brain development. For you  Breastfeeding helps to create a very special bond between you and your baby.  Breastfeeding is convenient. Breast milk costs nothing and is always available at the correct temperature.  Breastfeeding helps to burn calories. It helps you to lose the weight that you gained during pregnancy.  Breastfeeding makes your uterus return faster to its size before pregnancy. It also slows bleeding (lochia) after you give birth.  Breastfeeding helps to lower your risk of developing type 2 diabetes, osteoporosis, rheumatoid arthritis, cardiovascular disease, and breast, ovarian, uterine, and endometrial cancer later in life. Breastfeeding basics Starting breastfeeding  Find a comfortable place to sit or lie down, with your neck and back well-supported.  Place a pillow or a rolled-up blanket under your baby to bring him or her to the level of your breast (if you are seated). Nursing pillows are specially designed to help support your arms and your baby while you breastfeed.  Make sure that your baby's tummy (abdomen) is facing your abdomen.  Gently massage your breast. With your fingertips, massage from the outer edges of your breast inward toward the nipple. This encourages milk flow. If your milk flows slowly, you may need to continue this action during the feeding.  Support your breast with 4 fingers underneath and your thumb above your nipple (make the letter "C" with your hand). Make sure your fingers are well away from your nipple and your baby's mouth.  Stroke your baby's lips gently with your finger or nipple.  When your baby's mouth is open wide enough, quickly bring your baby to your breast, placing your entire nipple and as much of the areola as possible into your baby's mouth. The areola is the colored area around your nipple. ? More areola should be visible above your  baby's upper lip than below the lower lip. ? Your baby's lips should be opened and extended outward (flanged) to ensure an adequate, comfortable latch. ? Your baby's tongue should be between his or her lower gum and your breast.  Make sure that your baby's mouth is correctly positioned around your nipple (latched). Your baby's lips should create a seal on your breast and be turned out (everted).  It is common for your baby to suck about 2-3 minutes in order to start the flow of breast milk. Latching Teaching your baby how to latch onto your breast properly is very important. An improper latch can cause nipple pain, decreased milk supply, and poor weight gain in your baby. Also, if your baby is not latched onto your nipple properly, he or she may swallow some air during feeding. This can make your baby fussy. Burping your baby when you switch breasts during the feeding can help to get rid of the air. However, teaching your baby to latch on properly is still the best way to prevent fussiness from swallowing air while breastfeeding. Signs that your baby has successfully latched onto  your nipple  Silent tugging or silent sucking, without causing you pain. Infant's lips should be extended outward (flanged).  Swallowing heard between every 3-4 sucks once your milk has started to flow (after your let-down milk reflex occurs).  Muscle movement above and in front of his or her ears while sucking. Signs that your baby has not successfully latched onto your nipple  Sucking sounds or smacking sounds from your baby while breastfeeding.  Nipple pain. If you think your baby has not latched on correctly, slip your finger into the corner of your baby's mouth to break the suction and place it between your baby's gums. Attempt to start breastfeeding again. Signs of successful breastfeeding Signs from your baby  Your baby will gradually decrease the number of sucks or will completely stop sucking.  Your baby  will fall asleep.  Your baby's body will relax.  Your baby will retain a small amount of milk in his or her mouth.  Your baby will let go of your breast by himself or herself. Signs from you  Breasts that have increased in firmness, weight, and size 1-3 hours after feeding.  Breasts that are softer immediately after breastfeeding.  Increased milk volume, as well as a change in milk consistency and color by the fifth day of breastfeeding.  Nipples that are not sore, cracked, or bleeding. Signs that your baby is getting enough milk  Wetting at least 1-2 diapers during the first 24 hours after birth.  Wetting at least 5-6 diapers every 24 hours for the first week after birth. The urine should be clear or pale yellow by the age of 5 days.  Wetting 6-8 diapers every 24 hours as your baby continues to grow and develop.  At least 3 stools in a 24-hour period by the age of 5 days. The stool should be soft and yellow.  At least 3 stools in a 24-hour period by the age of 7 days. The stool should be seedy and yellow.  No loss of weight greater than 10% of birth weight during the first 3 days of life.  Average weight gain of 4-7 oz (113-198 g) per week after the age of 4 days.  Consistent daily weight gain by the age of 5 days, without weight loss after the age of 2 weeks. After a feeding, your baby may spit up a small amount of milk. This is normal. Breastfeeding frequency and duration Frequent feeding will help you make more milk and can prevent sore nipples and extremely full breasts (breast engorgement). Breastfeed when you feel the need to reduce the fullness of your breasts or when your baby shows signs of hunger. This is called "breastfeeding on demand." Signs that your baby is hungry include:  Increased alertness, activity, or restlessness.  Movement of the head from side to side.  Opening of the mouth when the corner of the mouth or cheek is stroked (rooting).  Increased  sucking sounds, smacking lips, cooing, sighing, or squeaking.  Hand-to-mouth movements and sucking on fingers or hands.  Fussing or crying. Avoid introducing a pacifier to your baby in the first 4-6 weeks after your baby is born. After this time, you may choose to use a pacifier. Research has shown that pacifier use during the first year of a baby's life decreases the risk of sudden infant death syndrome (SIDS). Allow your baby to feed on each breast as long as he or she wants. When your baby unlatches or falls asleep while feeding from the  first breast, offer the second breast. Because newborns are often sleepy in the first few weeks of life, you may need to awaken your baby to get him or her to feed. Breastfeeding times will vary from baby to baby. However, the following rules can serve as a guide to help you make sure that your baby is properly fed:  Newborns (babies 33 weeks of age or younger) may breastfeed every 1-3 hours.  Newborns should not go without breastfeeding for longer than 3 hours during the day or 5 hours during the night.  You should breastfeed your baby a minimum of 8 times in a 24-hour period. Breast milk pumping Pumping and storing breast milk allows you to make sure that your baby is exclusively fed your breast milk, even at times when you are unable to breastfeed. This is especially important if you go back to work while you are still breastfeeding, or if you are not able to be present during feedings. Your lactation consultant can help you find a method of pumping that works best for you and give you guidelines about how long it is safe to store breast milk.      Caring for your breasts while you breastfeed Nipples can become dry, cracked, and sore while breastfeeding. The following recommendations can help keep your breasts moisturized and healthy:  Avoid using soap on your nipples.  Wear a supportive bra designed especially for nursing. Avoid wearing underwire-style  bras or extremely tight bras (sports bras).  Air-dry your nipples for 3-4 minutes after each feeding.  Use only cotton bra pads to absorb leaked breast milk. Leaking of breast milk between feedings is normal.  Use lanolin on your nipples after breastfeeding. Lanolin helps to maintain your skin's normal moisture barrier. Pure lanolin is not harmful (not toxic) to your baby. You may also hand express a few drops of breast milk and gently massage that milk into your nipples and allow the milk to air-dry. In the first few weeks after giving birth, some women experience breast engorgement. Engorgement can make your breasts feel heavy, warm, and tender to the touch. Engorgement peaks within 3-5 days after you give birth. The following recommendations can help to ease engorgement:  Completely empty your breasts while breastfeeding or pumping. You may want to start by applying warm, moist heat (in the shower or with warm, water-soaked hand towels) just before feeding or pumping. This increases circulation and helps the milk flow. If your baby does not completely empty your breasts while breastfeeding, pump any extra milk after he or she is finished.  Apply ice packs to your breasts immediately after breastfeeding or pumping, unless this is too uncomfortable for you. To do this: ? Put ice in a plastic bag. ? Place a towel between your skin and the bag. ? Leave the ice on for 20 minutes, 2-3 times a day.  Make sure that your baby is latched on and positioned properly while breastfeeding. If engorgement persists after 48 hours of following these recommendations, contact your health care provider or a Science writer. Overall health care recommendations while breastfeeding  Eat 3 healthy meals and 3 snacks every day. Well-nourished mothers who are breastfeeding need an additional 450-500 calories a day. You can meet this requirement by increasing the amount of a balanced diet that you eat.  Drink  enough water to keep your urine pale yellow or clear.  Rest often, relax, and continue to take your prenatal vitamins to prevent fatigue, stress, and low  vitamin and mineral levels in your body (nutrient deficiencies).  Do not use any products that contain nicotine or tobacco, such as cigarettes and e-cigarettes. Your baby may be harmed by chemicals from cigarettes that pass into breast milk and exposure to secondhand smoke. If you need help quitting, ask your health care provider.  Avoid alcohol.  Do not use illegal drugs or marijuana.  Talk with your health care provider before taking any medicines. These include over-the-counter and prescription medicines as well as vitamins and herbal supplements. Some medicines that may be harmful to your baby can pass through breast milk.  It is possible to become pregnant while breastfeeding. If birth control is desired, ask your health care provider about options that will be safe while breastfeeding your baby. Where to find more information: [employer]: www.llli.org Contact a health care provider if:  You feel like you want to stop breastfeeding or have become frustrated with breastfeeding.  Your nipples are cracked or bleeding.  Your breasts are red, tender, or warm.  You have: ? Painful breasts or nipples. ? A swollen area on either breast. ? A fever or chills. ? Nausea or vomiting. ? Drainage other than breast milk from your nipples.  Your breasts do not become full before feedings by the fifth day after you give birth.  You feel sad and depressed.  Your baby is: ? Too sleepy to eat well. ? Having trouble sleeping. ? More than 64 week old and wetting fewer than 6 diapers in a 24-hour period. ? Not gaining weight by 52 days of age.  Your baby has fewer than 3 stools in a 24-hour period.  Your baby's skin or the white parts of his or her eyes become yellow. Get help right away if:  Your baby is overly tired  (lethargic) and does not want to wake up and feed.  Your baby develops an unexplained fever. Summary  Breastfeeding offers many health benefits for infant and mothers.  Try to breastfeed your infant when he or she shows early signs of hunger.  Gently tickle or stroke your baby's lips with your finger or nipple to allow the baby to open his or her mouth. Bring the baby to your breast. Make sure that much of the areola is in your baby's mouth. Offer one side and burp the baby before you offer the other side.  Talk with your health care provider or lactation consultant if you have questions or you face problems as you breastfeed. This information is not intended to replace advice given to you by your health care provider. Make sure you discuss any questions you have with your health care provider. Document Revised: 06/02/2017 Document Reviewed: 04/09/2016 Elsevier Patient Education  2021 [employer].

## 2020-04-16 NOTE — Progress Notes (Signed)
I connected with Annette Greene 04/16/20 at  2:35 PM EST by: MyChart video and verified that I am speaking with the correct person using two identifiers.  Patient is located at home and provider is located at [facility].     The purpose of this virtual visit is to provide medical care while limiting exposure to the novel coronavirus. I discussed the limitations, risks, security and privacy concerns of performing an evaluation and management service by MyChart video and the availability of in person appointments. I also discussed with the patient that there may be a patient responsible charge related to this service. By engaging in this virtual visit, you consent to the provision of healthcare.  Additionally, you authorize for your insurance to be billed for the services provided during this visit.  The patient expressed understanding and agreed to proceed.  The following staff members participated in the virtual visit:  Clarnce Flock, MD/MPH    PRENATAL VISIT NOTE  Subjective:  Annette Greene is a 30 y.o. [id number] at [redacted]w[redacted]d  for phone visit for ongoing prenatal care.  She is currently monitored for the following issues for this high-risk pregnancy and has Incompetent cervix in pregnancy, antepartum; Obesity, morbid ([place]); History of herpes simplex type 2 infection; Human papilloma virus infection; Irregular periods; Cyst of ovary; SVD (spontaneous vaginal delivery); Postpartum exam; Pancreatitis; Hidradenitis suppurativa; Supervision of high risk pregnancy, antepartum; and Elevated BP without diagnosis of hypertension on their problem list.  Patient reports no complaints.  Contractions: Not present. Vag. Bleeding: None.  Movement: Present. Denies leaking of fluid.   The following portions of the patient's history were reviewed and updated as appropriate: allergies, current medications, past family history, past medical history, past social history, past surgical history and problem list.    Objective:   Vitals:   04/15/20 1453  BP: (!) 129/56  Pulse: 82   Self-Obtained  Fetal Status:     Movement: Present     Assessment and Plan:  Pregnancy: G3P1102 at [redacted]w[redacted]d 1. Supervision of high risk pregnancy, antepartum BP normal No concerns [place] earlier this week normal RTC in 2 weeks for 28wk labs, TDaP  2. Obesity, morbid ([place])   3. History of herpes simplex type 2 infection Consider early prophylaxis given hx of PTD  4. Elevated BP without diagnosis of hypertension At outside office, all normal BP's with [employer]  5. Incompetent cervix in pregnancy, antepartum Cerclage in place  Preterm labor symptoms and general obstetric precautions including but not limited to vaginal bleeding, contractions, leaking of fluid and fetal movement were reviewed in detail with the patient.  Return in about 2 weeks (around 04/29/2020) for [facility], ob visit, 28 wk labs.  Future Appointments  Date Time Provider [place]  05/12/2020  9:45 AM [facility] NURSE [facility]  05/12/2020 10:00 AM [facility] US1 [facility]     Time spent on virtual visit: 10 minutes  Clarnce Flock, MD

## 2020-04-19 ENCOUNTER — Inpatient Hospital Stay (HOSPITAL_COMMUNITY)
Admission: AD | Admit: 2020-04-19 | Discharge: 2020-04-19 | Disposition: A | Discharge status: Home / Self Care | Payer: Medicaid Other | Attending: Obstetrics & Gynecology | Admitting: Obstetrics & Gynecology

## 2020-04-19 ENCOUNTER — Encounter (HOSPITAL_COMMUNITY): Payer: Self-pay | Admitting: Obstetrics & Gynecology

## 2020-04-19 DIAGNOSIS — O26852 Spotting complicating pregnancy, second trimester: Secondary | ICD-10-CM | POA: Insufficient documentation

## 2020-04-19 DIAGNOSIS — O3432 Maternal care for cervical incompetence, second trimester: Secondary | ICD-10-CM | POA: Diagnosis not present

## 2020-04-19 DIAGNOSIS — Z3A26 26 weeks gestation of pregnancy: Secondary | ICD-10-CM | POA: Insufficient documentation

## 2020-04-19 LAB — URINALYSIS, ROUTINE W REFLEX MICROSCOPIC
Bilirubin Urine: NEGATIVE
Glucose, UA: NEGATIVE mg/dL
Ketones, ur: NEGATIVE mg/dL
Leukocytes,Ua: NEGATIVE
Nitrite: NEGATIVE
Protein, ur: 30 mg/dL — AB
Specific Gravity, Urine: 1.028 (ref 1.005–1.030)
pH: 5 (ref 5.0–8.0)

## 2020-04-19 LAB — WET PREP, GENITAL
Clue Cells Wet Prep HPF POC: NONE SEEN
Sperm: NONE SEEN
Trich, Wet Prep: NONE SEEN
Yeast Wet Prep HPF POC: NONE SEEN
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
## 2020-04-19 NOTE — MAU Note (Signed)
.   Annette Greene is a 30 y.o. at [redacted]w[redacted]d here in MAU reporting: Patient states she has had vaginal bleeding around 1900 last night. She has also had lower abdominal cramping on and off. Has a cerclage in place. No LOF. Endorses good fetal movement. Patient had intercourse x2 days ago without penetration.   Pain score: 5 Vitals:   04/19/20 1258  BP: (!) 126/59  Pulse: 81  Resp: 15  Temp: 98.1 F (36.7 C)  SpO2: 100%     FHT:150 Lab orders placed from triage: UA

## 2020-04-19 NOTE — Discharge Instructions (Signed)

## 2020-04-21 LAB — GC/CHLAMYDIA PROBE AMP (~~LOC~~) NOT AT ARMC
Chlamydia: NEGATIVE
Comment: NEGATIVE
Comment: NORMAL
Neisseria Gonorrhea: NEGATIVE

## 2020-05-06 ENCOUNTER — Encounter: Payer: Self-pay | Admitting: Family Medicine

## 2020-05-06 ENCOUNTER — Ambulatory Visit (INDEPENDENT_AMBULATORY_CARE_PROVIDER_SITE_OTHER): Payer: Medicaid Other | Admitting: Family Medicine

## 2020-05-06 ENCOUNTER — Other Ambulatory Visit: Payer: Self-pay

## 2020-05-06 VITALS — BP 124/80 | HR 83 | Wt 253.1 lb

## 2020-05-06 DIAGNOSIS — D279 Benign neoplasm of unspecified ovary: Secondary | ICD-10-CM | POA: Insufficient documentation

## 2020-05-06 DIAGNOSIS — Z8619 Personal history of other infectious and parasitic diseases: Secondary | ICD-10-CM

## 2020-05-06 DIAGNOSIS — O0990 Supervision of high risk pregnancy, unspecified, unspecified trimester: Secondary | ICD-10-CM

## 2020-05-06 DIAGNOSIS — R030 Elevated blood-pressure reading, without diagnosis of hypertension: Secondary | ICD-10-CM

## 2020-05-06 DIAGNOSIS — O3430 Maternal care for cervical incompetence, unspecified trimester: Secondary | ICD-10-CM

## 2020-05-06 NOTE — Patient Instructions (Addendum)
Contraception Choices Contraception, also called birth control, refers to methods or devices that prevent pregnancy. Hormonal methods Contraceptive implant A contraceptive implant is a thin, plastic tube that contains a hormone that prevents pregnancy. It is different from an intrauterine device (IUD). It is inserted into the upper part of the arm by a health care provider. Implants can be effective for up to 3 years. Progestin-only injections Progestin-only injections are injections of progestin, a synthetic form of the hormone progesterone. They are given every 3 months by a health care provider. Birth control pills Birth control pills are pills that contain hormones that prevent pregnancy. They must be taken once a day, preferably at the same time each day. A prescription is needed to use this method of contraception. Birth control patch The birth control patch contains hormones that prevent pregnancy. It is placed on the skin and must be changed once a week for three weeks and removed on the fourth week. A prescription is needed to use this method of contraception. Vaginal ring A vaginal ring contains hormones that prevent pregnancy. It is placed in the vagina for three weeks and removed on the fourth week. After that, the process is repeated with a new ring. A prescription is needed to use this method of contraception. Emergency contraceptive Emergency contraceptives prevent pregnancy after unprotected sex. They come in pill form and can be taken up to 5 days after sex. They work best the sooner they are taken after having sex. Most emergency contraceptives are available without a prescription. This method should not be used as your only form of birth control.   Barrier methods Female condom A female condom is a thin sheath that is worn over the penis during sex. Condoms keep sperm from going inside a woman's body. They can be used with a sperm-killing substance (spermicide) to increase their  effectiveness. They should be thrown away after one use. Female condom A female condom is a soft, loose-fitting sheath that is put into the vagina before sex. The condom keeps sperm from going inside a woman's body. They should be thrown away after one use. Diaphragm A diaphragm is a soft, dome-shaped barrier. It is inserted into the vagina before sex, along with a spermicide. The diaphragm blocks sperm from entering the uterus, and the spermicide kills sperm. A diaphragm should be left in the vagina for 6-8 hours after sex and removed within 24 hours. A diaphragm is prescribed and fitted by a health care provider. A diaphragm should be replaced every 1-2 years, after giving birth, after gaining more than 15 lb (6.8 kg), and after pelvic surgery. Cervical cap A cervical cap is a round, soft latex or plastic cup that fits over the cervix. It is inserted into the vagina before sex, along with spermicide. It blocks sperm from entering the uterus. The cap should be left in place for 6-8 hours after sex and removed within 48 hours. A cervical cap must be prescribed and fitted by a health care provider. It should be replaced every 2 years. Sponge A sponge is a soft, circular piece of polyurethane foam with spermicide in it. The sponge helps block sperm from entering the uterus, and the spermicide kills sperm. To use it, you make it wet and then insert it into the vagina. It should be inserted before sex, left in for at least 6 hours after sex, and removed and thrown away within 30 hours. Spermicides Spermicides are chemicals that kill or block sperm from entering the  cervix and uterus. They can come as a cream, jelly, suppository, foam, or tablet. A spermicide should be inserted into the vagina with an applicator at least 41-66 minutes before sex to allow time for it to work. The process must be repeated every time you have sex. Spermicides do not require a prescription.   Intrauterine  contraception Intrauterine device (IUD) An IUD is a T-shaped device that is put in a woman's uterus. There are two types:  Hormone IUD.This type contains progestin, a synthetic form of the hormone progesterone. This type can stay in place for 3-5 years.  Copper IUD.This type is wrapped in copper wire. It can stay in place for 10 years. Permanent methods of contraception Female tubal ligation In this method, a woman's fallopian tubes are sealed, tied, or blocked during surgery to prevent eggs from traveling to the uterus. Hysteroscopic sterilization In this method, a small, flexible insert is placed into each fallopian tube. The inserts cause scar tissue to form in the fallopian tubes and block them, so sperm cannot reach an egg. The procedure takes about 3 months to be effective. Another form of birth control must be used during those 3 months. Female sterilization This is a procedure to tie off the tubes that carry sperm (vasectomy). After the procedure, the man can still ejaculate fluid (semen). Another form of birth control must be used for 3 months after the procedure. Natural planning methods Natural family planning In this method, a couple does not have sex on days when the woman could become pregnant. Calendar method In this method, the woman keeps track of the length of each menstrual cycle, identifies the days when pregnancy can happen, and does not have sex on those days. Ovulation method In this method, a couple avoids sex during ovulation. Symptothermal method This method involves not having sex during ovulation. The woman typically checks for ovulation by watching changes in her temperature and in the consistency of cervical mucus. Post-ovulation method In this method, a couple waits to have sex until after ovulation. Where to find more information  Centers for Disease Control and Prevention: http://www.wolf.info/ Summary  Contraception, also called birth control, refers to methods or  devices that prevent pregnancy.  Hormonal methods of contraception include implants, injections, pills, patches, vaginal rings, and emergency contraceptives.  Barrier methods of contraception can include female condoms, female condoms, diaphragms, cervical caps, sponges, and spermicides.  There are two types of IUDs (intrauterine devices). An IUD can be put in a woman's uterus to prevent pregnancy for 3-5 years.  Permanent sterilization can be done through a procedure for males and females. Natural family planning methods involve nothaving sex on days when the woman could become pregnant. This information is not intended to replace advice given to you by your health care provider. Make sure you discuss any questions you have with your health care provider. Document Revised: 08/13/2019 Document Reviewed: 08/13/2019 Elsevier Patient Education  [employer].   Breastfeeding  Choosing to breastfeed is one of the best decisions you can make for yourself and your baby. A change in hormones during pregnancy causes your breasts to make breast milk in your milk-producing glands. Hormones prevent breast milk from being released before your baby is born. They also prompt milk flow after birth. Once breastfeeding has begun, thoughts of your baby, as well as his or her sucking or crying, can stimulate the release of milk from your milk-producing glands. Benefits of breastfeeding Research shows that breastfeeding offers many health benefits  for infants and mothers. It also offers a cost-free and convenient way to feed your baby. For your baby  Your first milk (colostrum) helps your baby's digestive system to function better.  Special cells in your milk (antibodies) help your baby to fight off infections.  Breastfed babies are less likely to develop asthma, allergies, obesity, or type 2 diabetes. They are also at lower risk for sudden infant death syndrome (SIDS).  Nutrients in breast milk are better  able to meet your baby's needs compared to infant formula.  Breast milk improves your baby's brain development. For you  Breastfeeding helps to create a very special bond between you and your baby.  Breastfeeding is convenient. Breast milk costs nothing and is always available at the correct temperature.  Breastfeeding helps to burn calories. It helps you to lose the weight that you gained during pregnancy.  Breastfeeding makes your uterus return faster to its size before pregnancy. It also slows bleeding (lochia) after you give birth.  Breastfeeding helps to lower your risk of developing type 2 diabetes, osteoporosis, rheumatoid arthritis, cardiovascular disease, and breast, ovarian, uterine, and endometrial cancer later in life. Breastfeeding basics Starting breastfeeding  Find a comfortable place to sit or lie down, with your neck and back well-supported.  Place a pillow or a rolled-up blanket under your baby to bring him or her to the level of your breast (if you are seated). Nursing pillows are specially designed to help support your arms and your baby while you breastfeed.  Make sure that your baby's tummy (abdomen) is facing your abdomen.  Gently massage your breast. With your fingertips, massage from the outer edges of your breast inward toward the nipple. This encourages milk flow. If your milk flows slowly, you may need to continue this action during the feeding.  Support your breast with 4 fingers underneath and your thumb above your nipple (make the letter "C" with your hand). Make sure your fingers are well away from your nipple and your baby's mouth.  Stroke your baby's lips gently with your finger or nipple.  When your baby's mouth is open wide enough, quickly bring your baby to your breast, placing your entire nipple and as much of the areola as possible into your baby's mouth. The areola is the colored area around your nipple. ? More areola should be visible above your  baby's upper lip than below the lower lip. ? Your baby's lips should be opened and extended outward (flanged) to ensure an adequate, comfortable latch. ? Your baby's tongue should be between his or her lower gum and your breast.  Make sure that your baby's mouth is correctly positioned around your nipple (latched). Your baby's lips should create a seal on your breast and be turned out (everted).  It is common for your baby to suck about 2-3 minutes in order to start the flow of breast milk. Latching Teaching your baby how to latch onto your breast properly is very important. An improper latch can cause nipple pain, decreased milk supply, and poor weight gain in your baby. Also, if your baby is not latched onto your nipple properly, he or she may swallow some air during feeding. This can make your baby fussy. Burping your baby when you switch breasts during the feeding can help to get rid of the air. However, teaching your baby to latch on properly is still the best way to prevent fussiness from swallowing air while breastfeeding. Signs that your baby has successfully latched onto  your nipple  Silent tugging or silent sucking, without causing you pain. Infant's lips should be extended outward (flanged).  Swallowing heard between every 3-4 sucks once your milk has started to flow (after your let-down milk reflex occurs).  Muscle movement above and in front of his or her ears while sucking. Signs that your baby has not successfully latched onto your nipple  Sucking sounds or smacking sounds from your baby while breastfeeding.  Nipple pain. If you think your baby has not latched on correctly, slip your finger into the corner of your baby's mouth to break the suction and place it between your baby's gums. Attempt to start breastfeeding again. Signs of successful breastfeeding Signs from your baby  Your baby will gradually decrease the number of sucks or will completely stop sucking.  Your baby  will fall asleep.  Your baby's body will relax.  Your baby will retain a small amount of milk in his or her mouth.  Your baby will let go of your breast by himself or herself. Signs from you  Breasts that have increased in firmness, weight, and size 1-3 hours after feeding.  Breasts that are softer immediately after breastfeeding.  Increased milk volume, as well as a change in milk consistency and color by the fifth day of breastfeeding.  Nipples that are not sore, cracked, or bleeding. Signs that your baby is getting enough milk  Wetting at least 1-2 diapers during the first 24 hours after birth.  Wetting at least 5-6 diapers every 24 hours for the first week after birth. The urine should be clear or pale yellow by the age of 5 days.  Wetting 6-8 diapers every 24 hours as your baby continues to grow and develop.  At least 3 stools in a 24-hour period by the age of 5 days. The stool should be soft and yellow.  At least 3 stools in a 24-hour period by the age of 7 days. The stool should be seedy and yellow.  No loss of weight greater than 10% of birth weight during the first 3 days of life.  Average weight gain of 4-7 oz (113-198 g) per week after the age of 4 days.  Consistent daily weight gain by the age of 5 days, without weight loss after the age of 2 weeks. After a feeding, your baby may spit up a small amount of milk. This is normal. Breastfeeding frequency and duration Frequent feeding will help you make more milk and can prevent sore nipples and extremely full breasts (breast engorgement). Breastfeed when you feel the need to reduce the fullness of your breasts or when your baby shows signs of hunger. This is called "breastfeeding on demand." Signs that your baby is hungry include:  Increased alertness, activity, or restlessness.  Movement of the head from side to side.  Opening of the mouth when the corner of the mouth or cheek is stroked (rooting).  Increased  sucking sounds, smacking lips, cooing, sighing, or squeaking.  Hand-to-mouth movements and sucking on fingers or hands.  Fussing or crying. Avoid introducing a pacifier to your baby in the first 4-6 weeks after your baby is born. After this time, you may choose to use a pacifier. Research has shown that pacifier use during the first year of a baby's life decreases the risk of sudden infant death syndrome (SIDS). Allow your baby to feed on each breast as long as he or she wants. When your baby unlatches or falls asleep while feeding from the  first breast, offer the second breast. Because newborns are often sleepy in the first few weeks of life, you may need to awaken your baby to get him or her to feed. Breastfeeding times will vary from baby to baby. However, the following rules can serve as a guide to help you make sure that your baby is properly fed:  Newborns (babies 33 weeks of age or younger) may breastfeed every 1-3 hours.  Newborns should not go without breastfeeding for longer than 3 hours during the day or 5 hours during the night.  You should breastfeed your baby a minimum of 8 times in a 24-hour period. Breast milk pumping Pumping and storing breast milk allows you to make sure that your baby is exclusively fed your breast milk, even at times when you are unable to breastfeed. This is especially important if you go back to work while you are still breastfeeding, or if you are not able to be present during feedings. Your lactation consultant can help you find a method of pumping that works best for you and give you guidelines about how long it is safe to store breast milk.      Caring for your breasts while you breastfeed Nipples can become dry, cracked, and sore while breastfeeding. The following recommendations can help keep your breasts moisturized and healthy:  Avoid using soap on your nipples.  Wear a supportive bra designed especially for nursing. Avoid wearing underwire-style  bras or extremely tight bras (sports bras).  Air-dry your nipples for 3-4 minutes after each feeding.  Use only cotton bra pads to absorb leaked breast milk. Leaking of breast milk between feedings is normal.  Use lanolin on your nipples after breastfeeding. Lanolin helps to maintain your skin's normal moisture barrier. Pure lanolin is not harmful (not toxic) to your baby. You may also hand express a few drops of breast milk and gently massage that milk into your nipples and allow the milk to air-dry. In the first few weeks after giving birth, some women experience breast engorgement. Engorgement can make your breasts feel heavy, warm, and tender to the touch. Engorgement peaks within 3-5 days after you give birth. The following recommendations can help to ease engorgement:  Completely empty your breasts while breastfeeding or pumping. You may want to start by applying warm, moist heat (in the shower or with warm, water-soaked hand towels) just before feeding or pumping. This increases circulation and helps the milk flow. If your baby does not completely empty your breasts while breastfeeding, pump any extra milk after he or she is finished.  Apply ice packs to your breasts immediately after breastfeeding or pumping, unless this is too uncomfortable for you. To do this: ? Put ice in a plastic bag. ? Place a towel between your skin and the bag. ? Leave the ice on for 20 minutes, 2-3 times a day.  Make sure that your baby is latched on and positioned properly while breastfeeding. If engorgement persists after 48 hours of following these recommendations, contact your health care provider or a Science writer. Overall health care recommendations while breastfeeding  Eat 3 healthy meals and 3 snacks every day. Well-nourished mothers who are breastfeeding need an additional 450-500 calories a day. You can meet this requirement by increasing the amount of a balanced diet that you eat.  Drink  enough water to keep your urine pale yellow or clear.  Rest often, relax, and continue to take your prenatal vitamins to prevent fatigue, stress, and low  vitamin and mineral levels in your body (nutrient deficiencies).  Do not use any products that contain nicotine or tobacco, such as cigarettes and e-cigarettes. Your baby may be harmed by chemicals from cigarettes that pass into breast milk and exposure to secondhand smoke. If you need help quitting, ask your health care provider.  Avoid alcohol.  Do not use illegal drugs or marijuana.  Talk with your health care provider before taking any medicines. These include over-the-counter and prescription medicines as well as vitamins and herbal supplements. Some medicines that may be harmful to your baby can pass through breast milk.  It is possible to become pregnant while breastfeeding. If birth control is desired, ask your health care provider about options that will be safe while breastfeeding your baby. Where to find more information: [employer]: www.llli.org Contact a health care provider if:  You feel like you want to stop breastfeeding or have become frustrated with breastfeeding.  Your nipples are cracked or bleeding.  Your breasts are red, tender, or warm.  You have: ? Painful breasts or nipples. ? A swollen area on either breast. ? A fever or chills. ? Nausea or vomiting. ? Drainage other than breast milk from your nipples.  Your breasts do not become full before feedings by the fifth day after you give birth.  You feel sad and depressed.  Your baby is: ? Too sleepy to eat well. ? Having trouble sleeping. ? More than 1 week old and wetting fewer than 6 diapers in a 24-hour period. ? Not gaining weight by 5 days of age.  Your baby has fewer than 3 stools in a 24-hour period.  Your baby's skin or the white parts of his or her eyes become yellow. Get help right away if:  Your baby is overly tired  (lethargic) and does not want to wake up and feed.  Your baby develops an unexplained fever. Summary  Breastfeeding offers many health benefits for infant and mothers.  Try to breastfeed your infant when he or she shows early signs of hunger.  Gently tickle or stroke your baby's lips with your finger or nipple to allow the baby to open his or her mouth. Bring the baby to your breast. Make sure that much of the areola is in your baby's mouth. Offer one side and burp the baby before you offer the other side.  Talk with your health care provider or lactation consultant if you have questions or you face problems as you breastfeed. This information is not intended to replace advice given to you by your health care provider. Make sure you discuss any questions you have with your health care provider. Document Revised: 06/02/2017 Document Reviewed: 04/09/2016 Elsevier Patient Education  2021 Elsevier Inc.  

## 2020-05-06 NOTE — Progress Notes (Signed)
   Subjective:  Annette Greene is a 29 y.o. G3P1102 at [redacted]w[redacted]d being seen today for ongoing prenatal care.  She is currently monitored for the following issues for this high-risk pregnancy and has Incompetent cervix in pregnancy, antepartum; Obesity, morbid ([place]); History of herpes simplex type 2 infection; Human papilloma virus infection; Irregular periods; Cyst of ovary; SVD (spontaneous vaginal delivery); Postpartum exam; Pancreatitis; Hidradenitis suppurativa; Supervision of high risk pregnancy, antepartum; and Elevated BP without diagnosis of hypertension on their problem list.  Patient reports no complaints.  Contractions: Not present. Vag. Bleeding: None.  Movement: Present. Denies leaking of fluid.   The following portions of the patient's history were reviewed and updated as appropriate: allergies, current medications, past family history, past medical history, past social history, past surgical history and problem list. Problem list updated.  Objective:   Vitals:   05/06/20 1118  BP: 124/80  Pulse: 83  Weight: 253 lb 1.6 oz (114.8 kg)    Fetal Status: Fetal Heart Rate (bpm): 140   Movement: Present     General:  Alert, oriented and cooperative. Patient is in no acute distress.  Skin: Skin is warm and dry. No rash noted.   Cardiovascular: Normal heart rate noted  Respiratory: Normal respiratory effort, no problems with respiration noted  Abdomen: Soft, gravid, appropriate for gestational age. Pain/Pressure: Absent     Pelvic: Vag. Bleeding: None     Cervical exam deferred        Extremities: Normal range of motion.  Edema: None  Mental Status: Normal mood and affect. Normal behavior. Normal judgment and thought content.   Urinalysis:      Assessment and Plan:  Pregnancy: G3P1102 at [redacted]w[redacted]d  1. Supervision of high risk pregnancy, antepartum BP and FHR normal TDaP declined, counseled on indications and benefits 28wk labs today Discussed contraception, initially interested in  BTL but did not realize how permanent this method is. After discussion she would like hormonal IUD - Glucose Tolerance, 2 Hours w/1 Hour  2. Obesity, morbid ([place]) Following w MFM  3. History of herpes simplex type 2 infection suppresion at 36 weeks  4. Elevated BP without diagnosis of hypertension BP normal today  5. Incompetent cervix in pregnancy, antepartum No contractions   Preterm labor symptoms and general obstetric precautions including but not limited to vaginal bleeding, contractions, leaking of fluid and fetal movement were reviewed in detail with the patient. Please refer to After Visit Summary for other counseling recommendations.  Return in 2 weeks (on 05/20/2020) for [facility], ob visit.   Clarnce Flock, MD

## 2020-05-07 LAB — GLUCOSE TOLERANCE, 2 HOURS W/ 1HR
Glucose, 1 hour: 144 mg/dL (ref 65–179)
Glucose, 2 hour: 97 mg/dL (ref 65–152)
Glucose, Fasting: 75 mg/dL (ref 65–91)

## 2020-05-12 ENCOUNTER — Other Ambulatory Visit: Payer: Self-pay | Admitting: *Deleted

## 2020-05-12 ENCOUNTER — Ambulatory Visit: Payer: Medicaid Other | Attending: Internal Medicine

## 2020-05-12 ENCOUNTER — Encounter: Payer: Self-pay | Admitting: *Deleted

## 2020-05-12 ENCOUNTER — Ambulatory Visit: Payer: Medicaid Other | Attending: Obstetrics and Gynecology

## 2020-05-12 ENCOUNTER — Other Ambulatory Visit: Payer: Self-pay

## 2020-05-12 ENCOUNTER — Ambulatory Visit: Payer: Medicaid Other | Admitting: *Deleted

## 2020-05-12 VITALS — BP 106/57 | HR 73

## 2020-05-12 DIAGNOSIS — Z8751 Personal history of pre-term labor: Secondary | ICD-10-CM | POA: Diagnosis not present

## 2020-05-12 DIAGNOSIS — O3433 Maternal care for cervical incompetence, third trimester: Secondary | ICD-10-CM

## 2020-05-12 DIAGNOSIS — O99213 Obesity complicating pregnancy, third trimester: Secondary | ICD-10-CM

## 2020-05-12 DIAGNOSIS — Z362 Encounter for other antenatal screening follow-up: Secondary | ICD-10-CM

## 2020-05-12 DIAGNOSIS — Z3A30 30 weeks gestation of pregnancy: Secondary | ICD-10-CM

## 2020-05-12 DIAGNOSIS — Z6837 Body mass index (BMI) 37.0-37.9, adult: Secondary | ICD-10-CM | POA: Insufficient documentation

## 2020-05-12 DIAGNOSIS — O09213 Supervision of pregnancy with history of pre-term labor, third trimester: Secondary | ICD-10-CM

## 2020-05-12 DIAGNOSIS — Z23 Encounter for immunization: Secondary | ICD-10-CM

## 2020-05-12 NOTE — Progress Notes (Signed)
   Covid-19 Vaccination Clinic  Name:  Annette Greene    MRN: [id number] DOB: 1990-05-05  05/12/2020  [name] was observed post Covid-19 immunization for 15 minutes without incident. She was provided with Vaccine Information Sheet and instruction to access the V-Safe system.   [name] was instructed to call 911 with any severe reactions post vaccine: Marland Kitchen Difficulty breathing  . Swelling of face and throat  . A fast heartbeat  . A bad rash all over body  . Dizziness and weakness   Immunizations Administered    Name Date Dose VIS Date Route   PFIZER Comrnaty([place] TOP) Covid-19 Vaccine 05/12/2020  1:46 PM 0.3 mL 02/28/2020 Intramuscular   Manufacturer: [employer], [employer]   Lot: [id number]   [place]: [phone]

## 2020-05-19 ENCOUNTER — Other Ambulatory Visit: Payer: Self-pay | Admitting: *Deleted

## 2020-05-19 MED ORDER — DOCUSATE SODIUM 100 MG PO CAPS: 100.0000 mg | ORAL_CAPSULE | Freq: Two times a day (BID) | ORAL | 0 refills | Status: DC

## 2020-05-19 MED ORDER — PROMETHAZINE HCL 25 MG PO TABS: 25.0000 mg | ORAL_TABLET | Freq: Four times a day (QID) | ORAL | 1 refills | Status: DC | PRN

## 2020-05-29 ENCOUNTER — Telehealth: Payer: Medicaid Other | Admitting: Student

## 2020-06-10 ENCOUNTER — Encounter: Payer: Self-pay | Admitting: General Practice

## 2020-06-11 ENCOUNTER — Telehealth (INDEPENDENT_AMBULATORY_CARE_PROVIDER_SITE_OTHER): Payer: Medicaid Other | Admitting: Family Medicine

## 2020-06-11 ENCOUNTER — Encounter: Payer: Self-pay | Admitting: Family Medicine

## 2020-06-11 DIAGNOSIS — O3433 Maternal care for cervical incompetence, third trimester: Secondary | ICD-10-CM

## 2020-06-11 DIAGNOSIS — O99213 Obesity complicating pregnancy, third trimester: Secondary | ICD-10-CM

## 2020-06-11 DIAGNOSIS — Z8619 Personal history of other infectious and parasitic diseases: Secondary | ICD-10-CM

## 2020-06-11 DIAGNOSIS — Z3A34 34 weeks gestation of pregnancy: Secondary | ICD-10-CM

## 2020-06-11 DIAGNOSIS — O3430 Maternal care for cervical incompetence, unspecified trimester: Secondary | ICD-10-CM

## 2020-06-11 DIAGNOSIS — R030 Elevated blood-pressure reading, without diagnosis of hypertension: Secondary | ICD-10-CM

## 2020-06-11 DIAGNOSIS — O99891 Other specified diseases and conditions complicating pregnancy: Secondary | ICD-10-CM

## 2020-06-11 DIAGNOSIS — O0990 Supervision of high risk pregnancy, unspecified, unspecified trimester: Secondary | ICD-10-CM

## 2020-06-11 MED ORDER — VALACYCLOVIR HCL 500 MG PO TABS: 500.0000 mg | ORAL_TABLET | Freq: Two times a day (BID) | ORAL | 3 refills | Status: DC

## 2020-06-11 NOTE — Patient Instructions (Signed)
Contraception Choices Contraception, also called birth control, refers to methods or devices that prevent pregnancy. Hormonal methods Contraceptive implant A contraceptive implant is a thin, plastic tube that contains a hormone that prevents pregnancy. It is different from an intrauterine device (IUD). It is inserted into the upper part of the arm by a health care provider. Implants can be effective for up to 3 years. Progestin-only injections Progestin-only injections are injections of progestin, a synthetic form of the hormone progesterone. They are given every 3 months by a health care provider. Birth control pills Birth control pills are pills that contain hormones that prevent pregnancy. They must be taken once a day, preferably at the same time each day. A prescription is needed to use this method of contraception. Birth control patch The birth control patch contains hormones that prevent pregnancy. It is placed on the skin and must be changed once a week for three weeks and removed on the fourth week. A prescription is needed to use this method of contraception. Vaginal ring A vaginal ring contains hormones that prevent pregnancy. It is placed in the vagina for three weeks and removed on the fourth week. After that, the process is repeated with a new ring. A prescription is needed to use this method of contraception. Emergency contraceptive Emergency contraceptives prevent pregnancy after unprotected sex. They come in pill form and can be taken up to 5 days after sex. They work best the sooner they are taken after having sex. Most emergency contraceptives are available without a prescription. This method should not be used as your only form of birth control.   Barrier methods Female condom A female condom is a thin sheath that is worn over the penis during sex. Condoms keep sperm from going inside a woman's body. They can be used with a sperm-killing substance (spermicide) to increase their  effectiveness. They should be thrown away after one use. Female condom A female condom is a soft, loose-fitting sheath that is put into the vagina before sex. The condom keeps sperm from going inside a woman's body. They should be thrown away after one use. Diaphragm A diaphragm is a soft, dome-shaped barrier. It is inserted into the vagina before sex, along with a spermicide. The diaphragm blocks sperm from entering the uterus, and the spermicide kills sperm. A diaphragm should be left in the vagina for 6-8 hours after sex and removed within 24 hours. A diaphragm is prescribed and fitted by a health care provider. A diaphragm should be replaced every 1-2 years, after giving birth, after gaining more than 15 lb (6.8 kg), and after pelvic surgery. Cervical cap A cervical cap is a round, soft latex or plastic cup that fits over the cervix. It is inserted into the vagina before sex, along with spermicide. It blocks sperm from entering the uterus. The cap should be left in place for 6-8 hours after sex and removed within 48 hours. A cervical cap must be prescribed and fitted by a health care provider. It should be replaced every 2 years. Sponge A sponge is a soft, circular piece of polyurethane foam with spermicide in it. The sponge helps block sperm from entering the uterus, and the spermicide kills sperm. To use it, you make it wet and then insert it into the vagina. It should be inserted before sex, left in for at least 6 hours after sex, and removed and thrown away within 30 hours. Spermicides Spermicides are chemicals that kill or block sperm from entering the  cervix and uterus. They can come as a cream, jelly, suppository, foam, or tablet. A spermicide should be inserted into the vagina with an applicator at least 41-66 minutes before sex to allow time for it to work. The process must be repeated every time you have sex. Spermicides do not require a prescription.   Intrauterine  contraception Intrauterine device (IUD) An IUD is a T-shaped device that is put in a woman's uterus. There are two types:  Hormone IUD.This type contains progestin, a synthetic form of the hormone progesterone. This type can stay in place for 3-5 years.  Copper IUD.This type is wrapped in copper wire. It can stay in place for 10 years. Permanent methods of contraception Female tubal ligation In this method, a woman's fallopian tubes are sealed, tied, or blocked during surgery to prevent eggs from traveling to the uterus. Hysteroscopic sterilization In this method, a small, flexible insert is placed into each fallopian tube. The inserts cause scar tissue to form in the fallopian tubes and block them, so sperm cannot reach an egg. The procedure takes about 3 months to be effective. Another form of birth control must be used during those 3 months. Female sterilization This is a procedure to tie off the tubes that carry sperm (vasectomy). After the procedure, the man can still ejaculate fluid (semen). Another form of birth control must be used for 3 months after the procedure. Natural planning methods Natural family planning In this method, a couple does not have sex on days when the woman could become pregnant. Calendar method In this method, the woman keeps track of the length of each menstrual cycle, identifies the days when pregnancy can happen, and does not have sex on those days. Ovulation method In this method, a couple avoids sex during ovulation. Symptothermal method This method involves not having sex during ovulation. The woman typically checks for ovulation by watching changes in her temperature and in the consistency of cervical mucus. Post-ovulation method In this method, a couple waits to have sex until after ovulation. Where to find more information  Centers for Disease Control and Prevention: http://www.wolf.info/ Summary  Contraception, also called birth control, refers to methods or  devices that prevent pregnancy.  Hormonal methods of contraception include implants, injections, pills, patches, vaginal rings, and emergency contraceptives.  Barrier methods of contraception can include female condoms, female condoms, diaphragms, cervical caps, sponges, and spermicides.  There are two types of IUDs (intrauterine devices). An IUD can be put in a woman's uterus to prevent pregnancy for 3-5 years.  Permanent sterilization can be done through a procedure for males and females. Natural family planning methods involve nothaving sex on days when the woman could become pregnant. This information is not intended to replace advice given to you by your health care provider. Make sure you discuss any questions you have with your health care provider. Document Revised: 08/13/2019 Document Reviewed: 08/13/2019 Elsevier Patient Education  [employer].   Breastfeeding  Choosing to breastfeed is one of the best decisions you can make for yourself and your baby. A change in hormones during pregnancy causes your breasts to make breast milk in your milk-producing glands. Hormones prevent breast milk from being released before your baby is born. They also prompt milk flow after birth. Once breastfeeding has begun, thoughts of your baby, as well as his or her sucking or crying, can stimulate the release of milk from your milk-producing glands. Benefits of breastfeeding Research shows that breastfeeding offers many health benefits  for infants and mothers. It also offers a cost-free and convenient way to feed your baby. For your baby  Your first milk (colostrum) helps your baby's digestive system to function better.  Special cells in your milk (antibodies) help your baby to fight off infections.  Breastfed babies are less likely to develop asthma, allergies, obesity, or type 2 diabetes. They are also at lower risk for sudden infant death syndrome (SIDS).  Nutrients in breast milk are better  able to meet your baby's needs compared to infant formula.  Breast milk improves your baby's brain development. For you  Breastfeeding helps to create a very special bond between you and your baby.  Breastfeeding is convenient. Breast milk costs nothing and is always available at the correct temperature.  Breastfeeding helps to burn calories. It helps you to lose the weight that you gained during pregnancy.  Breastfeeding makes your uterus return faster to its size before pregnancy. It also slows bleeding (lochia) after you give birth.  Breastfeeding helps to lower your risk of developing type 2 diabetes, osteoporosis, rheumatoid arthritis, cardiovascular disease, and breast, ovarian, uterine, and endometrial cancer later in life. Breastfeeding basics Starting breastfeeding  Find a comfortable place to sit or lie down, with your neck and back well-supported.  Place a pillow or a rolled-up blanket under your baby to bring him or her to the level of your breast (if you are seated). Nursing pillows are specially designed to help support your arms and your baby while you breastfeed.  Make sure that your baby's tummy (abdomen) is facing your abdomen.  Gently massage your breast. With your fingertips, massage from the outer edges of your breast inward toward the nipple. This encourages milk flow. If your milk flows slowly, you may need to continue this action during the feeding.  Support your breast with 4 fingers underneath and your thumb above your nipple (make the letter "C" with your hand). Make sure your fingers are well away from your nipple and your baby's mouth.  Stroke your baby's lips gently with your finger or nipple.  When your baby's mouth is open wide enough, quickly bring your baby to your breast, placing your entire nipple and as much of the areola as possible into your baby's mouth. The areola is the colored area around your nipple. ? More areola should be visible above your  baby's upper lip than below the lower lip. ? Your baby's lips should be opened and extended outward (flanged) to ensure an adequate, comfortable latch. ? Your baby's tongue should be between his or her lower gum and your breast.  Make sure that your baby's mouth is correctly positioned around your nipple (latched). Your baby's lips should create a seal on your breast and be turned out (everted).  It is common for your baby to suck about 2-3 minutes in order to start the flow of breast milk. Latching Teaching your baby how to latch onto your breast properly is very important. An improper latch can cause nipple pain, decreased milk supply, and poor weight gain in your baby. Also, if your baby is not latched onto your nipple properly, he or she may swallow some air during feeding. This can make your baby fussy. Burping your baby when you switch breasts during the feeding can help to get rid of the air. However, teaching your baby to latch on properly is still the best way to prevent fussiness from swallowing air while breastfeeding. Signs that your baby has successfully latched onto  your nipple  Silent tugging or silent sucking, without causing you pain. Infant's lips should be extended outward (flanged).  Swallowing heard between every 3-4 sucks once your milk has started to flow (after your let-down milk reflex occurs).  Muscle movement above and in front of his or her ears while sucking. Signs that your baby has not successfully latched onto your nipple  Sucking sounds or smacking sounds from your baby while breastfeeding.  Nipple pain. If you think your baby has not latched on correctly, slip your finger into the corner of your baby's mouth to break the suction and place it between your baby's gums. Attempt to start breastfeeding again. Signs of successful breastfeeding Signs from your baby  Your baby will gradually decrease the number of sucks or will completely stop sucking.  Your baby  will fall asleep.  Your baby's body will relax.  Your baby will retain a small amount of milk in his or her mouth.  Your baby will let go of your breast by himself or herself. Signs from you  Breasts that have increased in firmness, weight, and size 1-3 hours after feeding.  Breasts that are softer immediately after breastfeeding.  Increased milk volume, as well as a change in milk consistency and color by the fifth day of breastfeeding.  Nipples that are not sore, cracked, or bleeding. Signs that your baby is getting enough milk  Wetting at least 1-2 diapers during the first 24 hours after birth.  Wetting at least 5-6 diapers every 24 hours for the first week after birth. The urine should be clear or pale yellow by the age of 5 days.  Wetting 6-8 diapers every 24 hours as your baby continues to grow and develop.  At least 3 stools in a 24-hour period by the age of 5 days. The stool should be soft and yellow.  At least 3 stools in a 24-hour period by the age of 7 days. The stool should be seedy and yellow.  No loss of weight greater than 10% of birth weight during the first 3 days of life.  Average weight gain of 4-7 oz (113-198 g) per week after the age of 4 days.  Consistent daily weight gain by the age of 5 days, without weight loss after the age of 2 weeks. After a feeding, your baby may spit up a small amount of milk. This is normal. Breastfeeding frequency and duration Frequent feeding will help you make more milk and can prevent sore nipples and extremely full breasts (breast engorgement). Breastfeed when you feel the need to reduce the fullness of your breasts or when your baby shows signs of hunger. This is called "breastfeeding on demand." Signs that your baby is hungry include:  Increased alertness, activity, or restlessness.  Movement of the head from side to side.  Opening of the mouth when the corner of the mouth or cheek is stroked (rooting).  Increased  sucking sounds, smacking lips, cooing, sighing, or squeaking.  Hand-to-mouth movements and sucking on fingers or hands.  Fussing or crying. Avoid introducing a pacifier to your baby in the first 4-6 weeks after your baby is born. After this time, you may choose to use a pacifier. Research has shown that pacifier use during the first year of a baby's life decreases the risk of sudden infant death syndrome (SIDS). Allow your baby to feed on each breast as long as he or she wants. When your baby unlatches or falls asleep while feeding from the  first breast, offer the second breast. Because newborns are often sleepy in the first few weeks of life, you may need to awaken your baby to get him or her to feed. Breastfeeding times will vary from baby to baby. However, the following rules can serve as a guide to help you make sure that your baby is properly fed:  Newborns (babies 33 weeks of age or younger) may breastfeed every 1-3 hours.  Newborns should not go without breastfeeding for longer than 3 hours during the day or 5 hours during the night.  You should breastfeed your baby a minimum of 8 times in a 24-hour period. Breast milk pumping Pumping and storing breast milk allows you to make sure that your baby is exclusively fed your breast milk, even at times when you are unable to breastfeed. This is especially important if you go back to work while you are still breastfeeding, or if you are not able to be present during feedings. Your lactation consultant can help you find a method of pumping that works best for you and give you guidelines about how long it is safe to store breast milk.      Caring for your breasts while you breastfeed Nipples can become dry, cracked, and sore while breastfeeding. The following recommendations can help keep your breasts moisturized and healthy:  Avoid using soap on your nipples.  Wear a supportive bra designed especially for nursing. Avoid wearing underwire-style  bras or extremely tight bras (sports bras).  Air-dry your nipples for 3-4 minutes after each feeding.  Use only cotton bra pads to absorb leaked breast milk. Leaking of breast milk between feedings is normal.  Use lanolin on your nipples after breastfeeding. Lanolin helps to maintain your skin's normal moisture barrier. Pure lanolin is not harmful (not toxic) to your baby. You may also hand express a few drops of breast milk and gently massage that milk into your nipples and allow the milk to air-dry. In the first few weeks after giving birth, some women experience breast engorgement. Engorgement can make your breasts feel heavy, warm, and tender to the touch. Engorgement peaks within 3-5 days after you give birth. The following recommendations can help to ease engorgement:  Completely empty your breasts while breastfeeding or pumping. You may want to start by applying warm, moist heat (in the shower or with warm, water-soaked hand towels) just before feeding or pumping. This increases circulation and helps the milk flow. If your baby does not completely empty your breasts while breastfeeding, pump any extra milk after he or she is finished.  Apply ice packs to your breasts immediately after breastfeeding or pumping, unless this is too uncomfortable for you. To do this: ? Put ice in a plastic bag. ? Place a towel between your skin and the bag. ? Leave the ice on for 20 minutes, 2-3 times a day.  Make sure that your baby is latched on and positioned properly while breastfeeding. If engorgement persists after 48 hours of following these recommendations, contact your health care provider or a Science writer. Overall health care recommendations while breastfeeding  Eat 3 healthy meals and 3 snacks every day. Well-nourished mothers who are breastfeeding need an additional 450-500 calories a day. You can meet this requirement by increasing the amount of a balanced diet that you eat.  Drink  enough water to keep your urine pale yellow or clear.  Rest often, relax, and continue to take your prenatal vitamins to prevent fatigue, stress, and low  vitamin and mineral levels in your body (nutrient deficiencies).  Do not use any products that contain nicotine or tobacco, such as cigarettes and e-cigarettes. Your baby may be harmed by chemicals from cigarettes that pass into breast milk and exposure to secondhand smoke. If you need help quitting, ask your health care provider.  Avoid alcohol.  Do not use illegal drugs or marijuana.  Talk with your health care provider before taking any medicines. These include over-the-counter and prescription medicines as well as vitamins and herbal supplements. Some medicines that may be harmful to your baby can pass through breast milk.  It is possible to become pregnant while breastfeeding. If birth control is desired, ask your health care provider about options that will be safe while breastfeeding your baby. Where to find more information: [employer]: www.llli.org Contact a health care provider if:  You feel like you want to stop breastfeeding or have become frustrated with breastfeeding.  Your nipples are cracked or bleeding.  Your breasts are red, tender, or warm.  You have: ? Painful breasts or nipples. ? A swollen area on either breast. ? A fever or chills. ? Nausea or vomiting. ? Drainage other than breast milk from your nipples.  Your breasts do not become full before feedings by the fifth day after you give birth.  You feel sad and depressed.  Your baby is: ? Too sleepy to eat well. ? Having trouble sleeping. ? More than 64 week old and wetting fewer than 6 diapers in a 24-hour period. ? Not gaining weight by 52 days of age.  Your baby has fewer than 3 stools in a 24-hour period.  Your baby's skin or the white parts of his or her eyes become yellow. Get help right away if:  Your baby is overly tired  (lethargic) and does not want to wake up and feed.  Your baby develops an unexplained fever. Summary  Breastfeeding offers many health benefits for infant and mothers.  Try to breastfeed your infant when he or she shows early signs of hunger.  Gently tickle or stroke your baby's lips with your finger or nipple to allow the baby to open his or her mouth. Bring the baby to your breast. Make sure that much of the areola is in your baby's mouth. Offer one side and burp the baby before you offer the other side.  Talk with your health care provider or lactation consultant if you have questions or you face problems as you breastfeed. This information is not intended to replace advice given to you by your health care provider. Make sure you discuss any questions you have with your health care provider. Document Revised: 06/02/2017 Document Reviewed: 04/09/2016 Elsevier Patient Education  2021 [employer].

## 2020-06-11 NOTE — Progress Notes (Signed)
I connected with Annette Greene 06/11/20 at  3:15 PM EDT by: MyChart video and verified that I am speaking with the correct person using two identifiers.  Patient is located at home and provider is located at [facility].     The purpose of this virtual visit is to provide medical care while limiting exposure to the novel coronavirus. I discussed the limitations, risks, security and privacy concerns of performing an evaluation and management service by MyChart video and the availability of in person appointments. I also discussed with the patient that there may be a patient responsible charge related to this service. By engaging in this virtual visit, you consent to the provision of healthcare.  Additionally, you authorize for your insurance to be billed for the services provided during this visit.  The patient expressed understanding and agreed to proceed.  The following staff members participated in the virtual visit:  Clarnce Flock, MD/MPH    PRENATAL VISIT NOTE  Subjective:  Annette Greene is a 30 y.o. [id number] at [redacted]w[redacted]d  for phone visit for ongoing prenatal care.  She is currently monitored for the following issues for this high-risk pregnancy and has Incompetent cervix in pregnancy, antepartum; Obesity, morbid ([place]); History of herpes simplex type 2 infection; Human papilloma virus infection; Irregular periods; Cyst of ovary; SVD (spontaneous vaginal delivery); Postpartum exam; Pancreatitis; Hidradenitis suppurativa; Supervision of high risk pregnancy, antepartum; Elevated BP without diagnosis of hypertension; and Ovarian teratoma on their problem list.  Patient reports no complaints.  Contractions: Not present. Vag. Bleeding: None.  Movement: Present. Denies leaking of fluid.   The following portions of the patient's history were reviewed and updated as appropriate: allergies, current medications, past family history, past medical history, past social history, past surgical history  and problem list.   Objective:  There were no vitals filed for this visit. Self-Obtained  Fetal Status:     Movement: Present     Assessment and Plan:  Pregnancy: G3P1102 at [redacted]w[redacted]d 1. Supervision of high risk pregnancy, antepartum Normal movement Did not have BP cuff with her  2. Obesity, morbid ([place])   3. History of herpes simplex type 2 infection Start valtrex suppresion  4. Elevated BP without diagnosis of hypertension In person visit next time  5. Incompetent cervix in pregnancy, antepartum Remove cerclage at next visit  Preterm labor symptoms and general obstetric precautions including but not limited to vaginal bleeding, contractions, leaking of fluid and fetal movement were reviewed in detail with the patient.  Return in 2 weeks (on 06/25/2020) for [facility], ob visit.  Future Appointments  Date Time Provider [place]  06/16/2020 10:45 AM [facility] NURSE [facility]  06/16/2020 11:00 AM [facility] US1 [facility] [place]     Time spent on virtual visit: 8 minutes  Clarnce Flock, MD

## 2020-06-11 NOTE — Progress Notes (Signed)
Pt states does not have access to Bp Cuff right now will take later & record in BRx.

## 2020-06-16 ENCOUNTER — Encounter: Payer: Self-pay | Admitting: *Deleted

## 2020-06-16 ENCOUNTER — Ambulatory Visit: Payer: Medicaid Other | Attending: Obstetrics

## 2020-06-16 ENCOUNTER — Other Ambulatory Visit: Payer: Self-pay

## 2020-06-16 ENCOUNTER — Ambulatory Visit: Payer: Medicaid Other | Admitting: *Deleted

## 2020-06-16 VITALS — BP 117/59 | HR 68

## 2020-06-16 DIAGNOSIS — O09213 Supervision of pregnancy with history of pre-term labor, third trimester: Secondary | ICD-10-CM

## 2020-06-16 DIAGNOSIS — O3433 Maternal care for cervical incompetence, third trimester: Secondary | ICD-10-CM | POA: Insufficient documentation

## 2020-06-16 DIAGNOSIS — O99213 Obesity complicating pregnancy, third trimester: Secondary | ICD-10-CM

## 2020-06-16 DIAGNOSIS — E669 Obesity, unspecified: Secondary | ICD-10-CM

## 2020-06-16 DIAGNOSIS — Z3A35 35 weeks gestation of pregnancy: Secondary | ICD-10-CM | POA: Diagnosis not present

## 2020-06-16 DIAGNOSIS — Z362 Encounter for other antenatal screening follow-up: Secondary | ICD-10-CM

## 2020-06-23 ENCOUNTER — Other Ambulatory Visit (HOSPITAL_COMMUNITY)
Admission: RE | Admit: 2020-06-23 | Discharge: 2020-06-23 | Disposition: A | Discharge status: Home / Self Care | Payer: Medicaid Other | Source: Ambulatory Visit | Attending: Family Medicine | Admitting: Family Medicine

## 2020-06-23 ENCOUNTER — Ambulatory Visit (INDEPENDENT_AMBULATORY_CARE_PROVIDER_SITE_OTHER): Payer: Medicaid Other | Admitting: Obstetrics and Gynecology

## 2020-06-23 ENCOUNTER — Other Ambulatory Visit: Payer: Self-pay

## 2020-06-23 VITALS — BP 133/81 | HR 84 | Wt 263.8 lb

## 2020-06-23 DIAGNOSIS — O0990 Supervision of high risk pregnancy, unspecified, unspecified trimester: Secondary | ICD-10-CM | POA: Insufficient documentation

## 2020-06-23 DIAGNOSIS — Z8619 Personal history of other infectious and parasitic diseases: Secondary | ICD-10-CM

## 2020-06-23 DIAGNOSIS — O3430 Maternal care for cervical incompetence, unspecified trimester: Secondary | ICD-10-CM | POA: Diagnosis not present

## 2020-06-23 NOTE — Progress Notes (Signed)
   PRENATAL VISIT NOTE  Subjective:  Annette Greene is a 30 y.o. G3P1102 at [redacted]w[redacted]d being seen today for ongoing prenatal care.  She is currently monitored for the following issues for this high-risk pregnancy and has Incompetent cervix in pregnancy, antepartum; Obesity, morbid ([place]); History of herpes simplex type 2 infection; Human papilloma virus infection; Irregular periods; Cyst of ovary; SVD (spontaneous vaginal delivery); Postpartum exam; Pancreatitis; Hidradenitis suppurativa; Supervision of high risk pregnancy, antepartum; Elevated BP without diagnosis of hypertension; and Ovarian teratoma on their problem list.  Patient doing well with no acute concerns today. She reports no complaints.  Contractions: Irritability. Vag. Bleeding: None.  Movement: Present. Denies leaking of fluid.   The following portions of the patient's history were reviewed and updated as appropriate: allergies, current medications, past family history, past medical history, past social history, past surgical history and problem list. Problem list updated.  Cerclage removal: A vaginal speculum was placed. Anterior knot of cervical cerclage was recognized and pulled upwards to visualize both sides of the circumferential suture under the knot. One side was cut and the remaining suture was pulled and removed intact. There was some bleeding noted.  Monsel's solution was applied and good hemostasis was noted  Speculum was removed, and cervix was checked and found to be closed.  Objective:   Vitals:   06/23/20 1528  BP: 133/81  Pulse: 84  Weight: 263 lb 12.8 oz (119.7 kg)    Fetal Status: Fetal Heart Rate (bpm): 140 Fundal Height: 36 cm Movement: Present     General:  Alert, oriented and cooperative. Patient is in no acute distress.  Skin: Skin is warm and dry. No rash noted.   Cardiovascular: Normal heart rate noted  Respiratory: Normal respiratory effort, no problems with respiration noted  Abdomen: Soft, gravid,  appropriate for gestational age.  Pain/Pressure: Absent     Pelvic: Cervical exam performed Dilation: Closed Effacement (%): Thick Station: -3  Extremities: Normal range of motion.  Edema: None  Mental Status:  Normal mood and affect. Normal behavior. Normal judgment and thought content.   Assessment and Plan:  Pregnancy: G3P1102 at [redacted]w[redacted]d  1. Incompetent cervix in pregnancy, antepartum Cerclage removed in sterile fashion, labor precautions given  2. Supervision of high risk pregnancy, antepartum  - GC/Chlamydia probe amp ([place])not at [facility] - Culture, beta strep (group b only)  3. Obesity, morbid ([place])   4. History of herpes simplex type 2 infection Pt currently taking valtrex for prophylaxis Term labor symptoms and general obstetric precautions including but not limited to vaginal bleeding, contractions, leaking of fluid and fetal movement were reviewed in detail with the patient.  Please refer to After Visit Summary for other counseling recommendations.   Return in about 1 week (around 06/30/2020) for [facility], in person.   Lynnda Shields, MD Faculty Attending Center for [facility]

## 2020-06-24 LAB — GC/CHLAMYDIA PROBE AMP (~~LOC~~) NOT AT ARMC
Chlamydia: NEGATIVE
Comment: NEGATIVE
Comment: NORMAL
Neisseria Gonorrhea: NEGATIVE

## 2020-06-25 ENCOUNTER — Other Ambulatory Visit: Payer: Self-pay

## 2020-06-25 ENCOUNTER — Ambulatory Visit (INDEPENDENT_AMBULATORY_CARE_PROVIDER_SITE_OTHER): Payer: Medicaid Other | Admitting: Obstetrics and Gynecology

## 2020-06-25 ENCOUNTER — Encounter: Payer: Medicaid Other | Admitting: Family Medicine

## 2020-06-25 ENCOUNTER — Telehealth: Payer: Self-pay | Admitting: Lactation Services

## 2020-06-25 DIAGNOSIS — Z3A36 36 weeks gestation of pregnancy: Secondary | ICD-10-CM

## 2020-06-25 DIAGNOSIS — O368131 Decreased fetal movements, third trimester, fetus 1: Secondary | ICD-10-CM | POA: Diagnosis not present

## 2020-06-25 NOTE — Progress Notes (Signed)
Pt here today with report of DFM.  Pt states that she has not felt baby move but twice today.  Pt also reports scant, vaginal bleeding with dot size clots after having a cerclage removal two days ago.    Mel Almond, RN  06/25/20

## 2020-06-25 NOTE — Progress Notes (Signed)
   PRENATAL VISIT NOTE  Subjective:  Annette Greene is a 30 y.o. G3P1102 at [redacted]w[redacted]d being seen today for ongoing prenatal care.  She is currently monitored for the following issues for this high-risk pregnancy and has Incompetent cervix in pregnancy, antepartum; Obesity, morbid ([place]); History of herpes simplex type 2 infection; Human papilloma virus infection; Irregular periods; Cyst of ovary; SVD (spontaneous vaginal delivery); Postpartum exam; Pancreatitis; Hidradenitis suppurativa; Supervision of high risk pregnancy, antepartum; Elevated BP without diagnosis of hypertension; Ovarian teratoma; [redacted] weeks gestation of pregnancy; and Decreased fetal movement, third trimester, fetus 1 on their problem list.  Patient doing well with concerns of light spotting and decreased fetal movement. She reports light spotting which only started today.  Contractions: Irritability. Vag. Bleeding: Scant.  Movement: (!) Decreased. Denies leaking of fluid.   The following portions of the patient's history were reviewed and updated as appropriate: allergies, current medications, past family history, past medical history, past social history, past surgical history and problem list. Problem list updated.  Objective:   Vitals:   06/25/20 1139  Weight: 262 lb 4.8 oz (119 kg)    Fetal Status:     Movement: (!) Decreased     General:  Alert, oriented and cooperative. Patient is in no acute distress.  Skin: Skin is warm and dry. No rash noted.   Cardiovascular: Normal heart rate noted  Respiratory: Normal respiratory effort, no problems with respiration noted  Abdomen: Soft, gravid, appropriate for gestational age.  Pain/Pressure: Absent     Pelvic: Cervical exam performed Dilation: 1.5 Effacement (%): 50 Station: -3  Extremities: Normal range of motion.  Edema: None  Mental Status:  Normal mood and affect. Normal behavior. Normal judgment and thought content.    NST: baseline 140s, moderate variability, no decels,  accels noted, category 1, reactive. Assessment and Plan:  Pregnancy: G3P1102 at [redacted]w[redacted]d  1. [redacted] weeks gestation of pregnancy No active bleeding noted, cervix is more dilated  2. Decreased fetal movement, third trimester, fetus 1 NST reactive  Term labor symptoms and general obstetric precautions including but not limited to vaginal bleeding, contractions, leaking of fluid and fetal movement were reviewed in detail with the patient.  Please refer to After Visit Summary for other counseling recommendations.   No follow-ups on file.   Lynnda Shields, MD Faculty Attending Center for [facility]

## 2020-06-25 NOTE — Telephone Encounter (Signed)
Discussed with Dr. Elgie Congo and Verdell Carmine, RN.   Called patient and she did not answer. LM for her to call the office ASAP to come in for NST this morning. Call back number left.   Will send My Chart message.

## 2020-06-26 ENCOUNTER — Encounter: Payer: Self-pay | Admitting: General Practice

## 2020-06-27 LAB — CULTURE, BETA STREP (GROUP B ONLY): Strep Gp B Culture: NEGATIVE

## 2020-07-01 ENCOUNTER — Ambulatory Visit (INDEPENDENT_AMBULATORY_CARE_PROVIDER_SITE_OTHER): Payer: Medicaid Other | Admitting: Family Medicine

## 2020-07-01 ENCOUNTER — Encounter: Payer: Self-pay | Admitting: Family Medicine

## 2020-07-01 VITALS — BP 126/70 | HR 87 | Wt 262.9 lb

## 2020-07-01 DIAGNOSIS — Z8619 Personal history of other infectious and parasitic diseases: Secondary | ICD-10-CM

## 2020-07-01 DIAGNOSIS — O3430 Maternal care for cervical incompetence, unspecified trimester: Secondary | ICD-10-CM

## 2020-07-01 DIAGNOSIS — O0990 Supervision of high risk pregnancy, unspecified, unspecified trimester: Secondary | ICD-10-CM

## 2020-07-01 DIAGNOSIS — R030 Elevated blood-pressure reading, without diagnosis of hypertension: Secondary | ICD-10-CM

## 2020-07-01 NOTE — Patient Instructions (Signed)
Contraception Choices Contraception, also called birth control, refers to methods or devices that prevent pregnancy. Hormonal methods Contraceptive implant A contraceptive implant is a thin, plastic tube that contains a hormone that prevents pregnancy. It is different from an intrauterine device (IUD). It is inserted into the upper part of the arm by a health care provider. Implants can be effective for up to 3 years. Progestin-only injections Progestin-only injections are injections of progestin, a synthetic form of the hormone progesterone. They are given every 3 months by a health care provider. Birth control pills Birth control pills are pills that contain hormones that prevent pregnancy. They must be taken once a day, preferably at the same time each day. A prescription is needed to use this method of contraception. Birth control patch The birth control patch contains hormones that prevent pregnancy. It is placed on the skin and must be changed once a week for three weeks and removed on the fourth week. A prescription is needed to use this method of contraception. Vaginal ring A vaginal ring contains hormones that prevent pregnancy. It is placed in the vagina for three weeks and removed on the fourth week. After that, the process is repeated with a new ring. A prescription is needed to use this method of contraception. Emergency contraceptive Emergency contraceptives prevent pregnancy after unprotected sex. They come in pill form and can be taken up to 5 days after sex. They work best the sooner they are taken after having sex. Most emergency contraceptives are available without a prescription. This method should not be used as your only form of birth control.   Barrier methods Female condom A female condom is a thin sheath that is worn over the penis during sex. Condoms keep sperm from going inside a woman's body. They can be used with a sperm-killing substance (spermicide) to increase their  effectiveness. They should be thrown away after one use. Female condom A female condom is a soft, loose-fitting sheath that is put into the vagina before sex. The condom keeps sperm from going inside a woman's body. They should be thrown away after one use. Diaphragm A diaphragm is a soft, dome-shaped barrier. It is inserted into the vagina before sex, along with a spermicide. The diaphragm blocks sperm from entering the uterus, and the spermicide kills sperm. A diaphragm should be left in the vagina for 6-8 hours after sex and removed within 24 hours. A diaphragm is prescribed and fitted by a health care provider. A diaphragm should be replaced every 1-2 years, after giving birth, after gaining more than 15 lb (6.8 kg), and after pelvic surgery. Cervical cap A cervical cap is a round, soft latex or plastic cup that fits over the cervix. It is inserted into the vagina before sex, along with spermicide. It blocks sperm from entering the uterus. The cap should be left in place for 6-8 hours after sex and removed within 48 hours. A cervical cap must be prescribed and fitted by a health care provider. It should be replaced every 2 years. Sponge A sponge is a soft, circular piece of polyurethane foam with spermicide in it. The sponge helps block sperm from entering the uterus, and the spermicide kills sperm. To use it, you make it wet and then insert it into the vagina. It should be inserted before sex, left in for at least 6 hours after sex, and removed and thrown away within 30 hours. Spermicides Spermicides are chemicals that kill or block sperm from entering the  cervix and uterus. They can come as a cream, jelly, suppository, foam, or tablet. A spermicide should be inserted into the vagina with an applicator at least 41-66 minutes before sex to allow time for it to work. The process must be repeated every time you have sex. Spermicides do not require a prescription.   Intrauterine  contraception Intrauterine device (IUD) An IUD is a T-shaped device that is put in a woman's uterus. There are two types:  Hormone IUD.This type contains progestin, a synthetic form of the hormone progesterone. This type can stay in place for 3-5 years.  Copper IUD.This type is wrapped in copper wire. It can stay in place for 10 years. Permanent methods of contraception Female tubal ligation In this method, a woman's fallopian tubes are sealed, tied, or blocked during surgery to prevent eggs from traveling to the uterus. Hysteroscopic sterilization In this method, a small, flexible insert is placed into each fallopian tube. The inserts cause scar tissue to form in the fallopian tubes and block them, so sperm cannot reach an egg. The procedure takes about 3 months to be effective. Another form of birth control must be used during those 3 months. Female sterilization This is a procedure to tie off the tubes that carry sperm (vasectomy). After the procedure, the man can still ejaculate fluid (semen). Another form of birth control must be used for 3 months after the procedure. Natural planning methods Natural family planning In this method, a couple does not have sex on days when the woman could become pregnant. Calendar method In this method, the woman keeps track of the length of each menstrual cycle, identifies the days when pregnancy can happen, and does not have sex on those days. Ovulation method In this method, a couple avoids sex during ovulation. Symptothermal method This method involves not having sex during ovulation. The woman typically checks for ovulation by watching changes in her temperature and in the consistency of cervical mucus. Post-ovulation method In this method, a couple waits to have sex until after ovulation. Where to find more information  Centers for Disease Control and Prevention: http://www.wolf.info/ Summary  Contraception, also called birth control, refers to methods or  devices that prevent pregnancy.  Hormonal methods of contraception include implants, injections, pills, patches, vaginal rings, and emergency contraceptives.  Barrier methods of contraception can include female condoms, female condoms, diaphragms, cervical caps, sponges, and spermicides.  There are two types of IUDs (intrauterine devices). An IUD can be put in a woman's uterus to prevent pregnancy for 3-5 years.  Permanent sterilization can be done through a procedure for males and females. Natural family planning methods involve nothaving sex on days when the woman could become pregnant. This information is not intended to replace advice given to you by your health care provider. Make sure you discuss any questions you have with your health care provider. Document Revised: 08/13/2019 Document Reviewed: 08/13/2019 Elsevier Patient Education  [employer].   Breastfeeding  Choosing to breastfeed is one of the best decisions you can make for yourself and your baby. A change in hormones during pregnancy causes your breasts to make breast milk in your milk-producing glands. Hormones prevent breast milk from being released before your baby is born. They also prompt milk flow after birth. Once breastfeeding has begun, thoughts of your baby, as well as his or her sucking or crying, can stimulate the release of milk from your milk-producing glands. Benefits of breastfeeding Research shows that breastfeeding offers many health benefits  for infants and mothers. It also offers a cost-free and convenient way to feed your baby. For your baby  Your first milk (colostrum) helps your baby's digestive system to function better.  Special cells in your milk (antibodies) help your baby to fight off infections.  Breastfed babies are less likely to develop asthma, allergies, obesity, or type 2 diabetes. They are also at lower risk for sudden infant death syndrome (SIDS).  Nutrients in breast milk are better  able to meet your baby's needs compared to infant formula.  Breast milk improves your baby's brain development. For you  Breastfeeding helps to create a very special bond between you and your baby.  Breastfeeding is convenient. Breast milk costs nothing and is always available at the correct temperature.  Breastfeeding helps to burn calories. It helps you to lose the weight that you gained during pregnancy.  Breastfeeding makes your uterus return faster to its size before pregnancy. It also slows bleeding (lochia) after you give birth.  Breastfeeding helps to lower your risk of developing type 2 diabetes, osteoporosis, rheumatoid arthritis, cardiovascular disease, and breast, ovarian, uterine, and endometrial cancer later in life. Breastfeeding basics Starting breastfeeding  Find a comfortable place to sit or lie down, with your neck and back well-supported.  Place a pillow or a rolled-up blanket under your baby to bring him or her to the level of your breast (if you are seated). Nursing pillows are specially designed to help support your arms and your baby while you breastfeed.  Make sure that your baby's tummy (abdomen) is facing your abdomen.  Gently massage your breast. With your fingertips, massage from the outer edges of your breast inward toward the nipple. This encourages milk flow. If your milk flows slowly, you may need to continue this action during the feeding.  Support your breast with 4 fingers underneath and your thumb above your nipple (make the letter "C" with your hand). Make sure your fingers are well away from your nipple and your baby's mouth.  Stroke your baby's lips gently with your finger or nipple.  When your baby's mouth is open wide enough, quickly bring your baby to your breast, placing your entire nipple and as much of the areola as possible into your baby's mouth. The areola is the colored area around your nipple. ? More areola should be visible above your  baby's upper lip than below the lower lip. ? Your baby's lips should be opened and extended outward (flanged) to ensure an adequate, comfortable latch. ? Your baby's tongue should be between his or her lower gum and your breast.  Make sure that your baby's mouth is correctly positioned around your nipple (latched). Your baby's lips should create a seal on your breast and be turned out (everted).  It is common for your baby to suck about 2-3 minutes in order to start the flow of breast milk. Latching Teaching your baby how to latch onto your breast properly is very important. An improper latch can cause nipple pain, decreased milk supply, and poor weight gain in your baby. Also, if your baby is not latched onto your nipple properly, he or she may swallow some air during feeding. This can make your baby fussy. Burping your baby when you switch breasts during the feeding can help to get rid of the air. However, teaching your baby to latch on properly is still the best way to prevent fussiness from swallowing air while breastfeeding. Signs that your baby has successfully latched onto  your nipple  Silent tugging or silent sucking, without causing you pain. Infant's lips should be extended outward (flanged).  Swallowing heard between every 3-4 sucks once your milk has started to flow (after your let-down milk reflex occurs).  Muscle movement above and in front of his or her ears while sucking. Signs that your baby has not successfully latched onto your nipple  Sucking sounds or smacking sounds from your baby while breastfeeding.  Nipple pain. If you think your baby has not latched on correctly, slip your finger into the corner of your baby's mouth to break the suction and place it between your baby's gums. Attempt to start breastfeeding again. Signs of successful breastfeeding Signs from your baby  Your baby will gradually decrease the number of sucks or will completely stop sucking.  Your baby  will fall asleep.  Your baby's body will relax.  Your baby will retain a small amount of milk in his or her mouth.  Your baby will let go of your breast by himself or herself. Signs from you  Breasts that have increased in firmness, weight, and size 1-3 hours after feeding.  Breasts that are softer immediately after breastfeeding.  Increased milk volume, as well as a change in milk consistency and color by the fifth day of breastfeeding.  Nipples that are not sore, cracked, or bleeding. Signs that your baby is getting enough milk  Wetting at least 1-2 diapers during the first 24 hours after birth.  Wetting at least 5-6 diapers every 24 hours for the first week after birth. The urine should be clear or pale yellow by the age of 5 days.  Wetting 6-8 diapers every 24 hours as your baby continues to grow and develop.  At least 3 stools in a 24-hour period by the age of 5 days. The stool should be soft and yellow.  At least 3 stools in a 24-hour period by the age of 7 days. The stool should be seedy and yellow.  No loss of weight greater than 10% of birth weight during the first 3 days of life.  Average weight gain of 4-7 oz (113-198 g) per week after the age of 4 days.  Consistent daily weight gain by the age of 5 days, without weight loss after the age of 2 weeks. After a feeding, your baby may spit up a small amount of milk. This is normal. Breastfeeding frequency and duration Frequent feeding will help you make more milk and can prevent sore nipples and extremely full breasts (breast engorgement). Breastfeed when you feel the need to reduce the fullness of your breasts or when your baby shows signs of hunger. This is called "breastfeeding on demand." Signs that your baby is hungry include:  Increased alertness, activity, or restlessness.  Movement of the head from side to side.  Opening of the mouth when the corner of the mouth or cheek is stroked (rooting).  Increased  sucking sounds, smacking lips, cooing, sighing, or squeaking.  Hand-to-mouth movements and sucking on fingers or hands.  Fussing or crying. Avoid introducing a pacifier to your baby in the first 4-6 weeks after your baby is born. After this time, you may choose to use a pacifier. Research has shown that pacifier use during the first year of a baby's life decreases the risk of sudden infant death syndrome (SIDS). Allow your baby to feed on each breast as long as he or she wants. When your baby unlatches or falls asleep while feeding from the  first breast, offer the second breast. Because newborns are often sleepy in the first few weeks of life, you may need to awaken your baby to get him or her to feed. Breastfeeding times will vary from baby to baby. However, the following rules can serve as a guide to help you make sure that your baby is properly fed:  Newborns (babies 33 weeks of age or younger) may breastfeed every 1-3 hours.  Newborns should not go without breastfeeding for longer than 3 hours during the day or 5 hours during the night.  You should breastfeed your baby a minimum of 8 times in a 24-hour period. Breast milk pumping Pumping and storing breast milk allows you to make sure that your baby is exclusively fed your breast milk, even at times when you are unable to breastfeed. This is especially important if you go back to work while you are still breastfeeding, or if you are not able to be present during feedings. Your lactation consultant can help you find a method of pumping that works best for you and give you guidelines about how long it is safe to store breast milk.      Caring for your breasts while you breastfeed Nipples can become dry, cracked, and sore while breastfeeding. The following recommendations can help keep your breasts moisturized and healthy:  Avoid using soap on your nipples.  Wear a supportive bra designed especially for nursing. Avoid wearing underwire-style  bras or extremely tight bras (sports bras).  Air-dry your nipples for 3-4 minutes after each feeding.  Use only cotton bra pads to absorb leaked breast milk. Leaking of breast milk between feedings is normal.  Use lanolin on your nipples after breastfeeding. Lanolin helps to maintain your skin's normal moisture barrier. Pure lanolin is not harmful (not toxic) to your baby. You may also hand express a few drops of breast milk and gently massage that milk into your nipples and allow the milk to air-dry. In the first few weeks after giving birth, some women experience breast engorgement. Engorgement can make your breasts feel heavy, warm, and tender to the touch. Engorgement peaks within 3-5 days after you give birth. The following recommendations can help to ease engorgement:  Completely empty your breasts while breastfeeding or pumping. You may want to start by applying warm, moist heat (in the shower or with warm, water-soaked hand towels) just before feeding or pumping. This increases circulation and helps the milk flow. If your baby does not completely empty your breasts while breastfeeding, pump any extra milk after he or she is finished.  Apply ice packs to your breasts immediately after breastfeeding or pumping, unless this is too uncomfortable for you. To do this: ? Put ice in a plastic bag. ? Place a towel between your skin and the bag. ? Leave the ice on for 20 minutes, 2-3 times a day.  Make sure that your baby is latched on and positioned properly while breastfeeding. If engorgement persists after 48 hours of following these recommendations, contact your health care provider or a Science writer. Overall health care recommendations while breastfeeding  Eat 3 healthy meals and 3 snacks every day. Well-nourished mothers who are breastfeeding need an additional 450-500 calories a day. You can meet this requirement by increasing the amount of a balanced diet that you eat.  Drink  enough water to keep your urine pale yellow or clear.  Rest often, relax, and continue to take your prenatal vitamins to prevent fatigue, stress, and low  vitamin and mineral levels in your body (nutrient deficiencies).  Do not use any products that contain nicotine or tobacco, such as cigarettes and e-cigarettes. Your baby may be harmed by chemicals from cigarettes that pass into breast milk and exposure to secondhand smoke. If you need help quitting, ask your health care provider.  Avoid alcohol.  Do not use illegal drugs or marijuana.  Talk with your health care provider before taking any medicines. These include over-the-counter and prescription medicines as well as vitamins and herbal supplements. Some medicines that may be harmful to your baby can pass through breast milk.  It is possible to become pregnant while breastfeeding. If birth control is desired, ask your health care provider about options that will be safe while breastfeeding your baby. Where to find more information: [employer]: www.llli.org Contact a health care provider if:  You feel like you want to stop breastfeeding or have become frustrated with breastfeeding.  Your nipples are cracked or bleeding.  Your breasts are red, tender, or warm.  You have: ? Painful breasts or nipples. ? A swollen area on either breast. ? A fever or chills. ? Nausea or vomiting. ? Drainage other than breast milk from your nipples.  Your breasts do not become full before feedings by the fifth day after you give birth.  You feel sad and depressed.  Your baby is: ? Too sleepy to eat well. ? Having trouble sleeping. ? More than 64 week old and wetting fewer than 6 diapers in a 24-hour period. ? Not gaining weight by 52 days of age.  Your baby has fewer than 3 stools in a 24-hour period.  Your baby's skin or the white parts of his or her eyes become yellow. Get help right away if:  Your baby is overly tired  (lethargic) and does not want to wake up and feed.  Your baby develops an unexplained fever. Summary  Breastfeeding offers many health benefits for infant and mothers.  Try to breastfeed your infant when he or she shows early signs of hunger.  Gently tickle or stroke your baby's lips with your finger or nipple to allow the baby to open his or her mouth. Bring the baby to your breast. Make sure that much of the areola is in your baby's mouth. Offer one side and burp the baby before you offer the other side.  Talk with your health care provider or lactation consultant if you have questions or you face problems as you breastfeed. This information is not intended to replace advice given to you by your health care provider. Make sure you discuss any questions you have with your health care provider. Document Revised: 06/02/2017 Document Reviewed: 04/09/2016 Elsevier Patient Education  2021 [employer].

## 2020-07-01 NOTE — Progress Notes (Signed)
   Subjective:  Annette Greene is a 30 y.o. G3P1102 at [redacted]w[redacted]d being seen today for ongoing prenatal care.  She is currently monitored for the following issues for this high-risk pregnancy and has Incompetent cervix in pregnancy, antepartum; Obesity, morbid ([place]); History of herpes simplex type 2 infection; Human papilloma virus infection; Irregular periods; Cyst of ovary; SVD (spontaneous vaginal delivery); Postpartum exam; Pancreatitis; Hidradenitis suppurativa; Supervision of high risk pregnancy, antepartum; Elevated BP without diagnosis of hypertension; Ovarian teratoma; [redacted] weeks gestation of pregnancy; and Decreased fetal movement, third trimester, fetus 1 on their problem list.  Patient reports no complaints.  Contractions: Irritability. Vag. Bleeding: None.  Movement: Present. Denies leaking of fluid.   The following portions of the patient's history were reviewed and updated as appropriate: allergies, current medications, past family history, past medical history, past social history, past surgical history and problem list. Problem list updated.  Objective:   Vitals:   07/01/20 1518  BP: 126/70  Pulse: 87  Weight: 262 lb 14.4 oz (119.3 kg)    Fetal Status: Fetal Heart Rate (bpm): 134   Movement: Present     General:  Alert, oriented and cooperative. Patient is in no acute distress.  Skin: Skin is warm and dry. No rash noted.   Cardiovascular: Normal heart rate noted  Respiratory: Normal respiratory effort, no problems with respiration noted  Abdomen: Soft, gravid, appropriate for gestational age. Pain/Pressure: Present     Pelvic: Vag. Bleeding: None     Cervical exam performed Dilation: 3 Effacement (%): Thick Station: -3  Extremities: Normal range of motion.  Edema: None  Mental Status: Normal mood and affect. Normal behavior. Normal judgment and thought content.   Urinalysis:      Assessment and Plan:  Pregnancy: G3P1102 at [redacted]w[redacted]d  1. Supervision of high risk pregnancy,  antepartum BP and FHR normal  2. Elevated BP without diagnosis of hypertension Only one elevated at outside office visit, normotensive during visits at [facility]  3. History of herpes simplex type 2 infection On valtrex  4. Obesity, morbid ([place])   5. Incompetent cervix in pregnancy, antepartum S/p cerclage removal last visit  Term labor symptoms and general obstetric precautions including but not limited to vaginal bleeding, contractions, leaking of fluid and fetal movement were reviewed in detail with the patient. Please refer to After Visit Summary for other counseling recommendations.  Return in 1 week (on 07/08/2020) for [facility], ob visit.   Clarnce Flock, MD

## 2020-07-02 ENCOUNTER — Encounter (HOSPITAL_COMMUNITY): Payer: Self-pay | Admitting: Obstetrics & Gynecology

## 2020-07-02 ENCOUNTER — Inpatient Hospital Stay (HOSPITAL_COMMUNITY)
Admission: AD | Admit: 2020-07-02 | Discharge: 2020-07-04 | DRG: 806 | Disposition: A | Discharge status: Home / Self Care | Payer: Medicaid Other | Attending: Obstetrics and Gynecology | Admitting: Obstetrics and Gynecology

## 2020-07-02 DIAGNOSIS — O99892 Other specified diseases and conditions complicating childbirth: Secondary | ICD-10-CM | POA: Diagnosis present

## 2020-07-02 DIAGNOSIS — Z23 Encounter for immunization: Secondary | ICD-10-CM

## 2020-07-02 DIAGNOSIS — O4290 Premature rupture of membranes, unspecified as to length of time between rupture and onset of labor, unspecified weeks of gestation: Secondary | ICD-10-CM | POA: Diagnosis present

## 2020-07-02 DIAGNOSIS — Z3A37 37 weeks gestation of pregnancy: Secondary | ICD-10-CM

## 2020-07-02 DIAGNOSIS — D271 Benign neoplasm of left ovary: Secondary | ICD-10-CM | POA: Diagnosis present

## 2020-07-02 DIAGNOSIS — Z975 Presence of (intrauterine) contraceptive device: Secondary | ICD-10-CM

## 2020-07-02 DIAGNOSIS — O9832 Other infections with a predominantly sexual mode of transmission complicating childbirth: Secondary | ICD-10-CM | POA: Diagnosis present

## 2020-07-02 DIAGNOSIS — Z20822 Contact with and (suspected) exposure to covid-19: Secondary | ICD-10-CM | POA: Diagnosis present

## 2020-07-02 DIAGNOSIS — O99214 Obesity complicating childbirth: Secondary | ICD-10-CM | POA: Diagnosis present

## 2020-07-02 DIAGNOSIS — Z87891 Personal history of nicotine dependence: Secondary | ICD-10-CM

## 2020-07-02 DIAGNOSIS — A6000 Herpesviral infection of urogenital system, unspecified: Secondary | ICD-10-CM | POA: Diagnosis present

## 2020-07-02 DIAGNOSIS — O322XX0 Maternal care for transverse and oblique lie, not applicable or unspecified: Principal | ICD-10-CM | POA: Diagnosis present

## 2020-07-02 DIAGNOSIS — Z30430 Encounter for insertion of intrauterine contraceptive device: Secondary | ICD-10-CM

## 2020-07-02 DIAGNOSIS — D270 Benign neoplasm of right ovary: Secondary | ICD-10-CM | POA: Diagnosis present

## 2020-07-02 LAB — POCT FERN TEST: POCT Fern Test: POSITIVE

## 2020-07-02 NOTE — MAU Note (Signed)
..  Annette Greene is a 30 y.o. at [redacted]w[redacted]d here in MAU reporting: SROM at 10:30pm. +FM. Denies vaginal bleeding. Reports contractions every 1-2 mins

## 2020-07-03 ENCOUNTER — Inpatient Hospital Stay (HOSPITAL_COMMUNITY): Payer: Medicaid Other | Admitting: Anesthesiology

## 2020-07-03 ENCOUNTER — Other Ambulatory Visit: Payer: Self-pay

## 2020-07-03 ENCOUNTER — Encounter (HOSPITAL_COMMUNITY): Payer: Self-pay | Admitting: Obstetrics and Gynecology

## 2020-07-03 DIAGNOSIS — O9902 Anemia complicating childbirth: Secondary | ICD-10-CM | POA: Diagnosis not present

## 2020-07-03 DIAGNOSIS — Z3A37 37 weeks gestation of pregnancy: Secondary | ICD-10-CM | POA: Diagnosis not present

## 2020-07-03 DIAGNOSIS — O4290 Premature rupture of membranes, unspecified as to length of time between rupture and onset of labor, unspecified weeks of gestation: Secondary | ICD-10-CM | POA: Diagnosis present

## 2020-07-03 DIAGNOSIS — Z20822 Contact with and (suspected) exposure to covid-19: Secondary | ICD-10-CM | POA: Diagnosis not present

## 2020-07-03 DIAGNOSIS — O9832 Other infections with a predominantly sexual mode of transmission complicating childbirth: Secondary | ICD-10-CM | POA: Diagnosis not present

## 2020-07-03 DIAGNOSIS — O322XX0 Maternal care for transverse and oblique lie, not applicable or unspecified: Secondary | ICD-10-CM | POA: Diagnosis not present

## 2020-07-03 DIAGNOSIS — Z87891 Personal history of nicotine dependence: Secondary | ICD-10-CM | POA: Diagnosis not present

## 2020-07-03 DIAGNOSIS — Z975 Presence of (intrauterine) contraceptive device: Secondary | ICD-10-CM

## 2020-07-03 DIAGNOSIS — O326XX0 Maternal care for compound presentation, not applicable or unspecified: Secondary | ICD-10-CM | POA: Diagnosis not present

## 2020-07-03 DIAGNOSIS — D649 Anemia, unspecified: Secondary | ICD-10-CM | POA: Diagnosis not present

## 2020-07-03 DIAGNOSIS — A6000 Herpesviral infection of urogenital system, unspecified: Secondary | ICD-10-CM | POA: Diagnosis not present

## 2020-07-03 DIAGNOSIS — O99892 Other specified diseases and conditions complicating childbirth: Secondary | ICD-10-CM | POA: Diagnosis not present

## 2020-07-03 DIAGNOSIS — D270 Benign neoplasm of right ovary: Secondary | ICD-10-CM | POA: Diagnosis not present

## 2020-07-03 DIAGNOSIS — Z30430 Encounter for insertion of intrauterine contraceptive device: Secondary | ICD-10-CM | POA: Diagnosis not present

## 2020-07-03 DIAGNOSIS — Z23 Encounter for immunization: Secondary | ICD-10-CM | POA: Diagnosis not present

## 2020-07-03 DIAGNOSIS — D271 Benign neoplasm of left ovary: Secondary | ICD-10-CM | POA: Diagnosis not present

## 2020-07-03 DIAGNOSIS — O26893 Other specified pregnancy related conditions, third trimester: Secondary | ICD-10-CM | POA: Diagnosis not present

## 2020-07-03 DIAGNOSIS — O99214 Obesity complicating childbirth: Secondary | ICD-10-CM | POA: Diagnosis not present

## 2020-07-03 LAB — CBC
HCT: 33.1 % — ABNORMAL LOW (ref 36.0–46.0)
Hemoglobin: 10.7 g/dL — ABNORMAL LOW (ref 12.0–15.0)
MCH: 27.4 pg (ref 26.0–34.0)
MCHC: 32.3 g/dL (ref 30.0–36.0)
MCV: 84.7 fL (ref 80.0–100.0)
Platelets: 262 10*3/uL (ref 150–400)
RBC: 3.91 MIL/uL (ref 3.87–5.11)
RDW: 14.8 % (ref 11.5–15.5)
WBC: 8.6 10*3/uL (ref 4.0–10.5)
nRBC: 0 % (ref 0.0–0.2)

## 2020-07-03 LAB — TYPE AND SCREEN
ABO/RH(D): O POS
Antibody Screen: NEGATIVE

## 2020-07-03 LAB — RESP PANEL BY RT-PCR (FLU A&B, COVID) ARPGX2
Influenza A by PCR: NEGATIVE
Influenza B by PCR: NEGATIVE
SARS Coronavirus 2 by RT PCR: NEGATIVE

## 2020-07-03 LAB — RPR: RPR Ser Ql: NONREACTIVE

## 2020-07-03 MED ORDER — WITCH HAZEL-GLYCERIN EX PADS: 1.0000 "application " | MEDICATED_PAD | CUTANEOUS | Status: DC | PRN

## 2020-07-03 MED ORDER — EPHEDRINE 5 MG/ML INJ: 10.0000 mg | INTRAVENOUS | Status: DC | PRN

## 2020-07-03 MED ORDER — LACTATED RINGERS IV SOLN: 500.0000 mL | Freq: Once | INTRAVENOUS | Status: DC

## 2020-07-03 MED ORDER — LIDOCAINE HCL (PF) 1 % IJ SOLN
INTRAMUSCULAR | Status: DC | PRN
  Administered 2020-07-03: 2 mL via EPIDURAL
  Administered 2020-07-03: 10 mL via EPIDURAL

## 2020-07-03 MED ORDER — FENTANYL-BUPIVACAINE-NACL 0.5-0.125-0.9 MG/250ML-% EP SOLN
12.0000 mL/h | EPIDURAL | Status: DC | PRN
Start: 2020-07-03 — End: 2020-07-03
  Filled 2020-07-03: qty 250

## 2020-07-03 MED ORDER — BENZOCAINE-MENTHOL 20-0.5 % EX AERO
1.0000 "application " | INHALATION_SPRAY | CUTANEOUS | Status: DC | PRN
  Administered 2020-07-03: 1 via TOPICAL
  Filled 2020-07-03: qty 56

## 2020-07-03 MED ORDER — LACTATED RINGERS IV SOLN
500.0000 mL | INTRAVENOUS | Status: DC | PRN
Start: 2020-07-03 — End: 2020-07-03

## 2020-07-03 MED ORDER — DIBUCAINE (PERIANAL) 1 % EX OINT: 1.0000 "application " | TOPICAL_OINTMENT | CUTANEOUS | Status: DC | PRN

## 2020-07-03 MED ORDER — OXYTOCIN-SODIUM CHLORIDE 30-0.9 UT/500ML-% IV SOLN
2.5000 [IU]/h | INTRAVENOUS | Status: DC
  Filled 2020-07-03: qty 500

## 2020-07-03 MED ORDER — OXYCODONE-ACETAMINOPHEN 5-325 MG PO TABS: 2.0000 | ORAL_TABLET | ORAL | Status: DC | PRN

## 2020-07-03 MED ORDER — ACETAMINOPHEN 325 MG PO TABS
650.0000 mg | ORAL_TABLET | ORAL | Status: DC | PRN
  Administered 2020-07-03: 650 mg via ORAL
  Filled 2020-07-03: qty 2

## 2020-07-03 MED ORDER — SOD CITRATE-CITRIC ACID 500-334 MG/5ML PO SOLN: 30.0000 mL | ORAL | Status: DC | PRN

## 2020-07-03 MED ORDER — LACTATED RINGERS IV SOLN: INTRAVENOUS | Status: DC

## 2020-07-03 MED ORDER — FENTANYL-BUPIVACAINE-NACL 0.5-0.125-0.9 MG/250ML-% EP SOLN
EPIDURAL | Status: DC | PRN
  Administered 2020-07-03: 12 mL/h via EPIDURAL

## 2020-07-03 MED ORDER — PHENYLEPHRINE 40 MCG/ML (10ML) SYRINGE FOR IV PUSH (FOR BLOOD PRESSURE SUPPORT): 80.0000 ug | PREFILLED_SYRINGE | INTRAVENOUS | Status: DC | PRN

## 2020-07-03 MED ORDER — DIPHENHYDRAMINE HCL 25 MG PO CAPS: 25.0000 mg | ORAL_CAPSULE | Freq: Four times a day (QID) | ORAL | Status: DC | PRN

## 2020-07-03 MED ORDER — LIDOCAINE HCL (PF) 1 % IJ SOLN: 30.0000 mL | INTRAMUSCULAR | Status: DC | PRN

## 2020-07-03 MED ORDER — SENNOSIDES-DOCUSATE SODIUM 8.6-50 MG PO TABS
2.0000 | ORAL_TABLET | ORAL | Status: DC
  Administered 2020-07-03: 2 via ORAL
  Filled 2020-07-03 (×2): qty 2

## 2020-07-03 MED ORDER — DOCUSATE SODIUM 100 MG PO CAPS
100.0000 mg | ORAL_CAPSULE | Freq: Two times a day (BID) | ORAL | Status: DC
  Administered 2020-07-03: 100 mg via ORAL
  Filled 2020-07-03 (×2): qty 1

## 2020-07-03 MED ORDER — ONDANSETRON HCL 4 MG/2ML IJ SOLN: 4.0000 mg | INTRAMUSCULAR | Status: DC | PRN

## 2020-07-03 MED ORDER — OXYTOCIN BOLUS FROM INFUSION
333.0000 mL | Freq: Once | INTRAVENOUS | Status: AC
  Administered 2020-07-03: 333 mL via INTRAVENOUS

## 2020-07-03 MED ORDER — ONDANSETRON HCL 4 MG/2ML IJ SOLN: 4.0000 mg | Freq: Four times a day (QID) | INTRAMUSCULAR | Status: DC | PRN

## 2020-07-03 MED ORDER — TETANUS-DIPHTH-ACELL PERTUSSIS 5-2.5-18.5 LF-MCG/0.5 IM SUSY
0.5000 mL | PREFILLED_SYRINGE | Freq: Once | INTRAMUSCULAR | Status: AC
  Administered 2020-07-04: 0.5 mL via INTRAMUSCULAR
  Filled 2020-07-03: qty 0.5

## 2020-07-03 MED ORDER — FENTANYL CITRATE (PF) 100 MCG/2ML IJ SOLN: 100.0000 ug | INTRAMUSCULAR | Status: DC | PRN

## 2020-07-03 MED ORDER — LEVONORGESTREL 19.5 MCG/DAY IU IUD
INTRAUTERINE_SYSTEM | Freq: Once | INTRAUTERINE | Status: AC
  Administered 2020-07-03: 1 via INTRAUTERINE
  Filled 2020-07-03: qty 1

## 2020-07-03 MED ORDER — DIPHENHYDRAMINE HCL 50 MG/ML IJ SOLN: 12.5000 mg | INTRAMUSCULAR | Status: DC | PRN

## 2020-07-03 MED ORDER — ONDANSETRON HCL 4 MG PO TABS: 4.0000 mg | ORAL_TABLET | ORAL | Status: DC | PRN

## 2020-07-03 MED ORDER — PRENATAL MULTIVITAMIN CH
1.0000 | ORAL_TABLET | Freq: Every day | ORAL | Status: DC
  Administered 2020-07-03 – 2020-07-04 (×2): 1 via ORAL
  Filled 2020-07-03 (×2): qty 1

## 2020-07-03 MED ORDER — SIMETHICONE 80 MG PO CHEW
80.0000 mg | CHEWABLE_TABLET | ORAL | Status: DC | PRN
Start: 2020-07-03 — End: 2020-07-05

## 2020-07-03 MED ORDER — IBUPROFEN 600 MG PO TABS
600.0000 mg | ORAL_TABLET | Freq: Four times a day (QID) | ORAL | Status: DC
  Administered 2020-07-03 – 2020-07-04 (×6): 600 mg via ORAL
  Filled 2020-07-03 (×6): qty 1

## 2020-07-03 MED ORDER — COCONUT OIL OIL: 1.0000 "application " | TOPICAL_OIL | Status: DC | PRN

## 2020-07-03 MED ORDER — OXYCODONE-ACETAMINOPHEN 5-325 MG PO TABS: 1.0000 | ORAL_TABLET | ORAL | Status: DC | PRN

## 2020-07-03 MED ORDER — ACETAMINOPHEN 325 MG PO TABS: 650.0000 mg | ORAL_TABLET | ORAL | Status: DC | PRN

## 2020-07-03 NOTE — Anesthesia Preprocedure Evaluation (Signed)
Anesthesia Evaluation  Patient identified by MRN, date of birth, ID band Patient awake    Reviewed: Allergy & Precautions, Patient's Chart, lab work & pertinent test results  Airway Mallampati: II  TM Distance: >3 FB Neck ROM: Full    Dental no notable dental hx. (+) Dental Advisory Given   Pulmonary former smoker,  Quit smoking 2018   Pulmonary exam normal breath sounds clear to auscultation       Cardiovascular hypertension, Normal cardiovascular exam Rhythm:Regular Rate:Normal     Neuro/Psych negative neurological ROS  negative psych ROS   GI/Hepatic negative GI ROS, Neg liver ROS,   Endo/Other  Morbid obesityBMI 41  Renal/GU negative Renal ROS  negative genitourinary   Musculoskeletal negative musculoskeletal ROS (+)   Abdominal Normal abdominal exam  (+) + obese,   Peds negative pediatric ROS (+)  Hematology  (+) Blood dyscrasia, anemia , hct 33.1, plt 262   Anesthesia Other Findings   Reproductive/Obstetrics (+) Pregnancy G3P2 has had epidurals in past w/o issue                            Anesthesia Physical Anesthesia Plan  ASA: III and emergent  Anesthesia Plan: Epidural   Post-op Pain Management:    Induction:   PONV Risk Score and Plan: 2  Airway Management Planned: Natural Airway  Additional Equipment: None  Intra-op Plan:   Post-operative Plan:   Informed Consent: I have reviewed the patients History and Physical, chart, labs and discussed the procedure including the risks, benefits and alternatives for the proposed anesthesia with the patient or authorized representative who has indicated his/her understanding and acceptance.       Plan Discussed with:   Anesthesia Plan Comments:         Anesthesia Quick Evaluation

## 2020-07-03 NOTE — Anesthesia Procedure Notes (Signed)
Epidural Patient location during procedure: OB Start time: 07/03/2020 12:36 AM End time: 07/03/2020 12:48 AM  Staffing Anesthesiologist: Pervis Hocking, DO Performed: anesthesiologist   Preanesthetic Checklist Completed: patient identified, IV checked, risks and benefits discussed, monitors and equipment checked, pre-op evaluation and timeout performed  Epidural Patient position: sitting Prep: DuraPrep and site prepped and draped Patient monitoring: continuous pulse ox, blood pressure, heart rate and cardiac monitor Approach: midline Location: L3-L4 Injection technique: LOR air  Needle:  Needle type: Tuohy  Needle gauge: 17 G Needle length: 9 cm Needle insertion depth: 8 cm Catheter type: closed end flexible Catheter size: 19 Gauge Catheter at skin depth: 13 cm Test dose: negative  Assessment Sensory level: T8 Events: blood not aspirated, injection not painful, no injection resistance, no paresthesia and negative IV test  Additional Notes Patient identified. Risks/Benefits/Options discussed with patient including but not limited to bleeding, infection, nerve damage, paralysis, failed block, incomplete pain control, headache, blood pressure changes, nausea, vomiting, reactions to medication both or allergic, itching and postpartum back pain. Confirmed with bedside nurse the patient's most recent platelet count. Confirmed with patient that they are not currently taking any anticoagulation, have any bleeding history or any family history of bleeding disorders. Patient expressed understanding and wished to proceed. All questions were answered. Sterile technique was used throughout the entire procedure. Please see nursing notes for vital signs. Test dose was given through epidural catheter and negative prior to continuing to dose epidural or start infusion. Warning signs of high block given to the patient including shortness of breath, tingling/numbness in hands, complete motor  block, or any concerning symptoms with instructions to call for help. Patient was given instructions on fall risk and not to get out of bed. All questions and concerns addressed with instructions to call with any issues or inadequate analgesia.  Reason for block:procedure for pain

## 2020-07-03 NOTE — Anesthesia Postprocedure Evaluation (Signed)
Anesthesia Post Note  Patient: Annette Greene  Procedure(s) Performed: AN AD HOC LABOR EPIDURAL     Patient location during evaluation: Mother Baby Anesthesia Type: Epidural Level of consciousness: awake and alert and oriented Pain management: satisfactory to patient Vital Signs Assessment: post-procedure vital signs reviewed and stable Respiratory status: respiratory function stable Cardiovascular status: stable Postop Assessment: no headache, no backache, epidural receding, patient able to bend at knees, no signs of nausea or vomiting, adequate PO intake and able to ambulate Anesthetic complications: no   No complications documented.  Last Vitals:  Vitals:   07/03/20 1045 07/03/20 1348  BP: 114/60 (!) 121/51  Pulse: 72 61  Resp: 18 18  Temp: 37.1 C 37.5 C  SpO2:      Last Pain:  Vitals:   07/03/20 1348  TempSrc: Oral  PainSc: 0-No pain   Pain Goal:                   Keri Tavella

## 2020-07-03 NOTE — Discharge Summary (Signed)
Postpartum Discharge Summary       Patient Name: Annette Greene DOB: [birth year] MRN: [id number]  Date of admission: 07/02/2020 Delivery date:07/03/2020  Delivering provider: Janet Berlin  Date of discharge: 07/04/2020  Admitting diagnosis: Amniotic fluid leaking [O42.90] Intrauterine pregnancy: [redacted]w[redacted]d    Secondary diagnosis:  Active Problems:   Amniotic fluid leaking   IUD (intrauterine device) in place   Vaginal delivery  Additional problems: none    Discharge diagnosis: Term Pregnancy Delivered                                              Post partum procedures:PP IUD Augmentation: N/A Complications: None  Hospital course: Onset of Labor With Vaginal Delivery      30y.o. yo [id number] [id number] admitted in Active Labor on 07/02/2020. Patient had an uncomplicated labor course as follows:  Membrane Rupture Time/Date: 11:00 PM ,07/02/2020   Delivery Method:Vaginal, Spontaneous  Episiotomy: None  Lacerations:  None  Patient had an uncomplicated postpartum course.  She is ambulating, tolerating a regular diet, passing flatus, and urinating well. Patient is discharged home in stable condition on 07/04/20.  Newborn Data: Birth date:07/03/2020  Birth time:7:46 AM  Gender:Female  Living status:Living  Apgars:9 ,9  Weight:2855 g   Magnesium Sulfate received: No BMZ received: No Rhophylac:N/A MMR:N/A T-DaP:declined Flu: No Transfusion:No  Physical exam  Vitals:   07/03/20 1348 07/03/20 1745 07/03/20 2204 07/04/20 0615  BP: (!) 121/51 123/70 (!) 115/59 137/78  Pulse: 61 69 75 72  Resp: 18 16 16 16   Temp: 99.5 F (37.5 C) 98.8 F (37.1 C) 98 F (36.7 C) 98.8 F (37.1 C)  TempSrc: Oral Oral Oral Oral  SpO2:  99%     General: alert, cooperative and no distress Lochia: appropriate Uterine Fundus: firm Incision: N/A DVT Evaluation: No evidence of DVT seen on physical exam. Labs: Lab Results  Component Value Date   WBC 8.6 07/03/2020   HGB 10.7 (L) 07/03/2020    HCT 33.1 (L) 07/03/2020   MCV 84.7 07/03/2020   PLT 262 07/03/2020   CMP Latest Ref Rng & Units 10/17/2018  Glucose 65 - 99 mg/dL 80  BUN 6 - 20 mg/dL 13  Creatinine 0.57 - 1.00 mg/dL 1.07(H)  Sodium 134 - 144 mmol/L 142  Potassium 3.5 - 5.2 mmol/L 4.3  Chloride 96 - 106 mmol/L 104  CO2 20 - 29 mmol/L 22  Calcium 8.7 - 10.2 mg/dL 8.9  Total Protein 6.0 - 8.5 g/dL 7.0  Total Bilirubin 0.0 - 1.2 mg/dL 0.2  Alkaline Phos 39 - 117 IU/L 59  AST 0 - 40 IU/L 18  ALT 0 - 32 IU/L 20   Edinburgh Score: Edinburgh Postnatal Depression Scale Screening Tool 07/03/2020  I have been able to laugh and see the funny side of things. 0  I have looked forward with enjoyment to things. 2  I have blamed myself unnecessarily when things went wrong. 0  I have been anxious or worried for no good reason. 2  I have felt scared or panicky for no good reason. 2  Things have been getting on top of me. 1  I have been so unhappy that I have had difficulty sleeping. 2  I have felt sad or miserable. 1  I have been so unhappy that I have been crying. 1  The thought of harming myself has occurred to me. 0  Edinburgh Postnatal Depression Scale Total 11     After visit meds:  Allergies as of 07/04/2020   No Known Allergies     Medication List    TAKE these medications   acetaminophen 500 MG tablet Commonly known as: TYLENOL Take 1,000 mg by mouth every 6 (six) hours as needed.   calcium carbonate 500 MG chewable tablet Commonly known as: TUMS - dosed in mg elemental calcium Chew 1 tablet by mouth daily.   coconut oil Oil Apply 1 application topically as needed.   ibuprofen 600 MG tablet Commonly known as: ADVIL Take 1 tablet (600 mg total) by mouth every 6 (six) hours.   Prenatal Vitamin 27-0.8 MG Tabs Prenatal Vitamin   senna-docusate 8.6-50 MG tablet Commonly known as: Senokot-S Take 2 tablets by mouth daily.   valACYclovir 500 MG tablet Commonly known as: Valtrex Take 1 tablet (500 mg  total) by mouth 2 (two) times daily.        Discharge home in stable condition Infant Feeding: Bottle and Breast Infant Disposition:home with mother Discharge instruction: per After Visit Summary and Postpartum booklet. Activity: Advance as tolerated. Pelvic rest for 6 weeks.  Diet: routine diet Future Appointments: Future Appointments  Date Time Provider [place]  07/18/2020 10:55 AM Janet Berlin, MD [facility]  08/12/2020  2:55 PM Aibhlinn Kalmar, Placido Sou, MD [facility]   Follow up Visit:   Please schedule this patient for a In person postpartum visit in 6 weeks with the following provider: Any provider. Additional Postpartum F/U:IUD string check in 2 weeks  Low risk pregnancy complicated by:  uncomplicated Delivery mode:  Vaginal, Spontaneous  Anticipated Birth Control:  PP IUD placed   07/04/2020 Janet Berlin, MD

## 2020-07-03 NOTE — H&P (Signed)
OBSTETRIC ADMISSION HISTORY AND PHYSICAL  Annette Greene is a 30 y.o. female [phone] with IUP at [redacted]w[redacted]d by LMP presenting for SROM. Reports water broke at 10:30 PM, has been having contractions every few minutes. She reports +FMs, no VB, no blurry vision, headaches or peripheral edema, and RUQ pain.  She plans on breast/bottle feeding. She request post placental IUD for birth control. She received her prenatal care at [facility]   Dating: By LMP --->  Estimated Date of Delivery: 07/21/20  Sono:    @[redacted]w[redacted]d , CWD, normal anatomy, cephalic presentation, 2595G, 43% EFW   Prenatal History/Complications:   -hx incompetent cervix (hx delivery at 26 wks), cerclage during this pregnancy (s/p removal)  -BMI 41 -Hx HSV, on valtrex  -elevated baseline P:C of .83, has been normotensive at prenatal visits  -bilateral cystic teratoma's of ovaries (1-2 cm)    Past Medical History: Past Medical History:  Diagnosis Date  . Herpes   . Hx of chlamydia infection   . Hypertension   . Infection    UTI  . Preterm labor     Past Surgical History: Past Surgical History:  Procedure Laterality Date  . CERVICAL CERCLAGE N/A 01/25/2018   Procedure: CERCLAGE CERVICAL;  Surgeon: Everett Graff, MD;  Location: [place];  Service: Gynecology;  Laterality: N/A;  . TONSILLECTOMY AND ADENOIDECTOMY    . WISDOM TOOTH EXTRACTION     all 4 teeth    Obstetrical History: OB History    Gravida  3   Para  2   Term  1   Preterm  1   AB      Living  2     SAB      IAB      Ectopic      Multiple  0   Live Births  2           Social History Social History   Socioeconomic History  . Marital status: Single    Spouse name: Not on file  . Number of children: Not on file  . Years of education: Not on file  . Highest education level: Not on file  Occupational History  . Not on file  Tobacco Use  . Smoking status: Former Smoker    Packs/day: 0.50    Types: Cigarettes    Quit date:  06/24/2016    Years since quitting: 4.0  . Smokeless tobacco: Never Used  . Tobacco comment: stopped since pregnant  Vaping Use  . Vaping Use: Never used  Substance and Sexual Activity  . Alcohol use: Not Currently    Comment: stopped with pregnancy  . Drug use: Not Currently  . Sexual activity: Not Currently    Birth control/protection: None  Other Topics Concern  . Not on file  Social History Narrative  . Not on file   Social Determinants of Health   Financial Resource Strain: Not on file  Food Insecurity: No Food Insecurity  . Worried About Charity fundraiser in the Last Year: Never true  . Ran Out of Food in the Last Year: Never true  Transportation Needs: No Transportation Needs  . Lack of Transportation (Medical): No  . Lack of Transportation (Non-Medical): No  Physical Activity: Not on file  Stress: Not on file  Social Connections: Not on file    Family History: Family History  Problem Relation Age of Onset  . Healthy Mother   . Hypertension Mother   . Healthy Father   . Healthy  Sister   . Healthy Maternal Grandmother   . Hypertension Maternal Grandmother   . Healthy Paternal Grandmother   . Healthy Paternal Grandfather   . Healthy Sister   . Drug abuse Maternal Aunt   . Hypertension Maternal Aunt   . Diabetes Maternal Aunt     Allergies: No Known Allergies  Medications Prior to Admission  Medication Sig Dispense Refill Last Dose  . Prenatal Vit-Fe Fumarate-FA (PRENATAL VITAMIN) 27-0.8 MG TABS Prenatal Vitamin   07/02/2020 at Unknown time  . valACYclovir (VALTREX) 500 MG tablet Take 1 tablet (500 mg total) by mouth 2 (two) times daily. 60 tablet 3 07/02/2020 at Unknown time  . acetaminophen (TYLENOL) 500 MG tablet Take 1,000 mg by mouth every 6 (six) hours as needed.     . calcium carbonate (TUMS - DOSED IN MG ELEMENTAL CALCIUM) 500 MG chewable tablet Chew 1 tablet by mouth daily.        Review of Systems   All systems reviewed and negative except as  stated in HPI  Blood pressure 133/62, pulse 78, temperature 98.7 F (37.1 C), resp. rate 12, last menstrual period 10/03/2019, SpO2 100 %, currently breastfeeding. General appearance: alert, cooperative and appears stated age Lungs: effort normal  Abdomen: soft, non-tender; bowel sounds normal Pelvic: no lesions on exam  Extremities: Homans sign is negative, no sign of DVT Presentation: cephalic by US Fetal monitoring baseline 130, mod variability, pos accels, no decels  Uterine activity q2-3 min Dilation: 3 Effacement (%): 50   Prenatal labs: ABO, Rh: O/Positive/-- (09/20 0000) Antibody: Negative (09/20 0000) Rubella: Immune (09/20 0000) RPR: Nonreactive (09/20 0000)  HBsAg: Negative (09/20 0000)  HIV: Non-reactive (09/20 0000)  GBS: Negative/-- (04/04 1614)  2 hr Glucola normal Genetic screening  Neg quad screen Anatomy US nomrla   Prenatal Transfer Tool  Maternal Diabetes: No Genetic Screening: Normal Maternal Ultrasounds/Referrals: Normal Fetal Ultrasounds or other Referrals:  None Maternal Substance Abuse:  No Significant Maternal Medications:  None Significant Maternal Lab Results: Group B Strep negative  Results for orders placed or performed during the hospital encounter of 07/02/20 (from the past 24 hour(s))  POCT fern test   Collection Time: 07/02/20 11:48 PM  Result Value Ref Range   POCT Fern Test Positive = ruptured amniotic membanes     Patient Active Problem List   Diagnosis Date Noted  . Amniotic fluid leaking 07/03/2020  . [redacted] weeks gestation of pregnancy 06/25/2020  . Decreased fetal movement, third trimester, fetus 1 06/25/2020  . Ovarian teratoma 05/06/2020  . Elevated BP without diagnosis of hypertension 03/07/2020  . Supervision of high risk pregnancy, antepartum 12/20/2019  . Hidradenitis suppurativa 12/19/2018  . Pancreatitis 10/19/2018  . Postpartum exam 09/07/2018  . SVD (spontaneous vaginal delivery) 07/21/2018  . Human papilloma virus  infection 06/02/2018  . Irregular periods 06/02/2018  . Obesity, morbid ([place]) 12/27/2017  . History of herpes simplex type 2 infection 12/27/2017  . Incompetent cervix in pregnancy, antepartum 11/24/2016  . Cyst of ovary 08/17/2016    Assessment/Plan:  Annette Greene is a 30 y.o. G3P1102 at [redacted]w[redacted]d here for SROM.   #Labor: expectant mgmt for now. Can augment prn.  #HSV: no lesions on exam   #Pain: Per patient request  #FWB: Cat I  #ID:  GBS neg  #MOF: breast & bottle #MOC:pp Liletta. Ordered and consented.  #Circ:  yes  Janet Berlin, MD  07/03/2020, 12:07 AM

## 2020-07-04 MED ORDER — SENNOSIDES-DOCUSATE SODIUM 8.6-50 MG PO TABS: 2.0000 | ORAL_TABLET | ORAL | 0 refills | Status: DC

## 2020-07-04 MED ORDER — COCONUT OIL OIL: 1.0000 "application " | TOPICAL_OIL | 0 refills | Status: DC | PRN

## 2020-07-04 MED ORDER — IBUPROFEN 600 MG PO TABS: 600.0000 mg | ORAL_TABLET | Freq: Four times a day (QID) | ORAL | 0 refills | Status: DC

## 2020-07-04 NOTE — Discharge Instructions (Signed)

## 2020-07-04 NOTE — Social Work (Addendum)
CSW received consult for Edinburgh score: 11. CSW met with MOB to offer support and complete assessment.    CSW met with MOB at bedside. CSW introduced role and congratulated MOB. CSW observed MOB eating breakfast and infant resting in bassinet next to the bedside. CSW explain reason for the visit. MOB pleasant and receptive to the visit. CSW asked MOB how she has felt since giving birth. MOB report, " I feel good, ready to go home."  CSW explained the [place] assessment and score with MOB. MOB reports she answered the questions based on the past couple of weeks. MOB reports she is not feeling that way now. MOB reports she is very mindful of her mood because of her history with postpartum depression with her other children. MOB reports she usually gets postpartum depression 2-3 weeks after giving birth. MOB reports during this time she has a very supportive family. MOB reports her mother will take the children and give her a day or two to take a break. MOB reports in the past her doctor has also prescribed "the happy pill." MOB unsure of the medication provided for postpartum depression. MOB reports she has a Investment banker, corporate that she can readily talk to when needed. CSW praised MOB for having a plan of care. CSW assessed MOB for safety. MOB denies thoughts to harm self and others. MOB reports no domestic violence.   CSW provided education regarding the baby blues period vs. perinatal mood disorders, discussed treatment and gave resources for mental health follow up. CSW recommended MOB complete a self-evaluation during the postpartum time period using the New Mom Checklist from [employer]partum [employer] and encouraged MOB to contact a medical professional if symptoms are noted at any time. MOB receptive to the resources.    CSW provided review of Sudden Infant Death Syndrome (SIDS) precautions and informed MOB no co-sleeping with the infant. MOB reports understanding. CSW asked MOB if she has essential  items for the infant. MOB reports she has all essential items for the infant. CSW assessed MOB for additional needs. MOB reports no further need.    CSW identifies no further need for intervention and no barriers to discharge at this time.  Kathrin Greathouse, MSW, LCSW [facility] Worker  [phone] 07/04/2020 10:23 AM

## 2020-07-07 ENCOUNTER — Telehealth: Payer: Self-pay

## 2020-07-07 NOTE — Telephone Encounter (Signed)
Transition Care Management Follow-up Telephone Call  Date of discharge and from where: [facility] 07/04/2020  How have you been since you were released from the hospital? Doing well  Any questions or concerns? No  Items Reviewed:  Did the pt receive and understand the discharge instructions provided? Yes   Medications obtained and verified? Yes   Other? No   Any new allergies since your discharge? No   Dietary orders reviewed? Yes  Do you have support at home? Yes   Home Care and Equipment/Supplies: Were home health services ordered? not applicable If so, what is the name of the agency?   Has the agency set up a time to come to the patient's home? not applicable Were any new equipment or medical supplies ordered?  No What is the name of the medical supply agency?  Were you able to get the supplies/equipment? not applicable Do you have any questions related to the use of the equipment or supplies? No  Functional Questionnaire: (I = Independent and D = Dependent) ADLs: I  Bathing/Dressing- I  Meal Prep- I  Eating- I  Maintaining continence- I  Transferring/Ambulation- I  Managing Meds- I  Follow up appointments reviewed:   PCP Hospital f/u appt confirmed? No   Specialist Hospital f/u appt confirmed? Yes 4/29 OBG[facility]  Are transportation arrangements needed? No   If their condition worsens, is the pt aware to call PCP or go to the Emergency Dept.? Yes  Was the patient provided with contact information for the PCP's office or ED? Yes  Was to pt encouraged to call back with questions or concerns? Yes

## 2020-07-09 ENCOUNTER — Encounter: Payer: Medicaid Other | Admitting: Family Medicine

## 2020-07-18 ENCOUNTER — Ambulatory Visit: Payer: Self-pay | Admitting: Obstetrics and Gynecology

## 2020-08-06 ENCOUNTER — Telehealth: Payer: Self-pay | Admitting: Family Medicine

## 2020-08-06 NOTE — Telephone Encounter (Signed)
Pt states she is breast feeding and baby has thrush and needs medication for mother.

## 2020-08-07 MED ORDER — NYSTATIN 100000 UNIT/GM EX CREA: TOPICAL_CREAM | CUTANEOUS | 1 refills | Status: DC

## 2020-08-07 NOTE — Addendum Note (Signed)
Addended by: Donn Pierini on: 08/07/2020 10:52 AM   Modules accepted: Orders

## 2020-08-07 NOTE — Telephone Encounter (Signed)
Called patient and mom reports she is having pain with latch only. Infant is one 78 old. She is not having pain or burning to nipples or breasts.   Infant being treated with Thrush for second time.   Recommended:   Vinegar wash (1 TBSP white vinegar to 1 cup water) discard and make new each day and apply with new cotton ball daily. Allow to dry.   Apply thin layer of Nystatin cream to nipples after each breast feeding or pumping.   Apply Nystatin to infant's mouth with q tip to coat all surfaces of the mouth.   Use burp cloths and bibs once and then wash burp cloths, bibs, towels, breast pads and bras daily in hot soapy water and dry in hot dryer.   Boil for 20 minutes or microwave sterilize bottles, pacifiers, or pump parts daily.   Call back for worsening symptoms.

## 2020-08-11 ENCOUNTER — Ambulatory Visit: Payer: Self-pay | Admitting: Nurse Practitioner

## 2020-08-12 ENCOUNTER — Ambulatory Visit: Payer: Self-pay | Admitting: Obstetrics and Gynecology

## 2020-09-16 ENCOUNTER — Ambulatory Visit: Payer: Medicaid Other | Admitting: Family Medicine

## 2020-11-28 DIAGNOSIS — R059 Cough, unspecified: Secondary | ICD-10-CM | POA: Diagnosis not present

## 2020-11-28 DIAGNOSIS — R112 Nausea with vomiting, unspecified: Secondary | ICD-10-CM | POA: Diagnosis not present

## 2020-11-28 DIAGNOSIS — R509 Fever, unspecified: Secondary | ICD-10-CM | POA: Diagnosis not present

## 2020-11-28 DIAGNOSIS — Z20822 Contact with and (suspected) exposure to covid-19: Secondary | ICD-10-CM | POA: Diagnosis not present

## 2020-12-08 ENCOUNTER — Other Ambulatory Visit: Payer: Self-pay

## 2020-12-08 ENCOUNTER — Encounter (HOSPITAL_BASED_OUTPATIENT_CLINIC_OR_DEPARTMENT_OTHER): Payer: Self-pay | Admitting: Pharmacy Technician

## 2020-12-08 ENCOUNTER — Emergency Department (HOSPITAL_BASED_OUTPATIENT_CLINIC_OR_DEPARTMENT_OTHER)
Admission: EM | Admit: 2020-12-08 | Discharge: 2020-12-08 | Disposition: A | Discharge status: Home / Self Care | Payer: Medicaid Other | Attending: Emergency Medicine | Admitting: Emergency Medicine

## 2020-12-08 DIAGNOSIS — Z79899 Other long term (current) drug therapy: Secondary | ICD-10-CM | POA: Diagnosis not present

## 2020-12-08 DIAGNOSIS — Z87891 Personal history of nicotine dependence: Secondary | ICD-10-CM | POA: Insufficient documentation

## 2020-12-08 DIAGNOSIS — Z20822 Contact with and (suspected) exposure to covid-19: Secondary | ICD-10-CM | POA: Diagnosis not present

## 2020-12-08 DIAGNOSIS — J029 Acute pharyngitis, unspecified: Secondary | ICD-10-CM | POA: Insufficient documentation

## 2020-12-08 DIAGNOSIS — J069 Acute upper respiratory infection, unspecified: Secondary | ICD-10-CM | POA: Diagnosis not present

## 2020-12-08 DIAGNOSIS — I10 Essential (primary) hypertension: Secondary | ICD-10-CM | POA: Insufficient documentation

## 2020-12-08 DIAGNOSIS — B9789 Other viral agents as the cause of diseases classified elsewhere: Secondary | ICD-10-CM | POA: Diagnosis not present

## 2020-12-08 LAB — RESP PANEL BY RT-PCR (FLU A&B, COVID) ARPGX2
Influenza A by PCR: NEGATIVE
Influenza B by PCR: NEGATIVE
SARS Coronavirus 2 by RT PCR: NEGATIVE

## 2020-12-08 NOTE — ED Provider Notes (Signed)
[facility] EMERGENCY DEPARTMENT Provider Note   CSN: [id number] Arrival date & time: 12/08/20  0830     History Chief Complaint  Patient presents with   Sore Throat   Fever   Headache    Annette Greene is a 30 y.o. female.   Sore Throat Associated symptoms include headaches. Pertinent negatives include no chest pain, no abdominal pain and no shortness of breath.  Fever Associated symptoms: congestion, headaches and sore throat   Associated symptoms: no chest pain, no chills, no cough, no diarrhea, no dysuria, no ear pain, no myalgias, no nausea, no rash and no vomiting   Headache Associated symptoms: congestion, fever and sore throat   Associated symptoms: no abdominal pain, no back pain, no cough, no diarrhea, no dizziness, no ear pain, no eye pain, no fatigue, no myalgias, no nausea, no neck pain, no numbness, no seizures, no vomiting and no weakness   Patient presents for headache, fever, sore throat.  Symptoms have been present for the past 2 days.  T-max at home was 100.1 this morning.  She did take Tylenol at that time.  She has had a cough and states that she has expectorated some dark mucus.  She denies any shortness of breath.  LMP was several months ago patient states that she has irregular periods.  She has not had any abdominal symptoms: Pain, nausea, vomiting, diarrhea.  She has not had any known sick contacts but does have 3 children who do attend daycare.    Past Medical History:  Diagnosis Date   Herpes    Hx of chlamydia infection    Hypertension    Infection    UTI   Preterm labor     Patient Active Problem List   Diagnosis Date Noted   Vaginal delivery 07/04/2020   Amniotic fluid leaking 07/03/2020   IUD (intrauterine device) in place 07/03/2020   [redacted] weeks gestation of pregnancy 06/25/2020   Decreased fetal movement, third trimester, fetus 1 06/25/2020   Ovarian teratoma 05/06/2020   Elevated BP without diagnosis of hypertension 03/07/2020    Supervision of high risk pregnancy, antepartum 12/20/2019   Hidradenitis suppurativa 12/19/2018   Pancreatitis 10/19/2018   Postpartum exam 09/07/2018   SVD (spontaneous vaginal delivery) 07/21/2018   Human papilloma virus infection 06/02/2018   Irregular periods 06/02/2018   Obesity, morbid ([place]) 12/27/2017   History of herpes simplex type 2 infection 12/27/2017   Incompetent cervix in pregnancy, antepartum 11/24/2016   Cyst of ovary 08/17/2016    Past Surgical History:  Procedure Laterality Date   CERVICAL CERCLAGE N/A 01/25/2018   Procedure: CERCLAGE CERVICAL;  Surgeon: Everett Graff, MD;  Location: [facility];  Service: Gynecology;  Laterality: N/A;   TONSILLECTOMY AND ADENOIDECTOMY     WISDOM TOOTH EXTRACTION     all 4 teeth     OB History     Gravida  3   Para  3   Term  2   Preterm  1   AB      Living  3      SAB      IAB      Ectopic      Multiple  0   Live Births  3           Family History  Problem Relation Age of Onset   Healthy Mother    Hypertension Mother    Healthy Father    Healthy Sister    Healthy Maternal Grandmother  Hypertension Maternal Grandmother    Healthy Paternal Grandmother    Healthy Paternal Grandfather    Healthy Sister    Drug abuse Maternal Aunt    Hypertension Maternal Aunt    Diabetes Maternal Aunt     Social History   Tobacco Use   Smoking status: Former    Packs/day: 0.50    Types: Cigarettes    Quit date: 06/24/2016    Years since quitting: 4.4   Smokeless tobacco: Never   Tobacco comments:    stopped since pregnant  Vaping Use   Vaping Use: Never used  Substance Use Topics   Alcohol use: Not Currently    Comment: stopped with pregnancy   Drug use: Not Currently    Home Medications Prior to Admission medications   Medication Sig Start Date End Date Taking? Authorizing Provider  acetaminophen (TYLENOL) 500 MG tablet Take 1,000 mg by mouth every 6 (six) hours as needed.     [provider]  calcium carbonate (TUMS - DOSED IN MG ELEMENTAL CALCIUM) 500 MG chewable tablet Chew 1 tablet by mouth daily.    [provider]  coconut oil OIL Apply 1 application topically as needed. 07/04/20   Janet Berlin, MD  ibuprofen (ADVIL) 600 MG tablet Take 1 tablet (600 mg total) by mouth every 6 (six) hours. 07/04/20   Janet Berlin, MD  nystatin cream (MYCOSTATIN) Apply thin layer to nipples after each breastfeeding or pumping session. Continue to use for 2 weeks after all symptoms are gone. 08/07/20   Griffin Basil, MD  Prenatal Vit-Fe Fumarate-FA (PRENATAL VITAMIN) 27-0.8 MG TABS Prenatal Vitamin    [provider]  senna-docusate (SENOKOT-S) 8.6-50 MG tablet Take 2 tablets by mouth daily. 07/04/20   Janet Berlin, MD  valACYclovir (VALTREX) 500 MG tablet Take 1 tablet (500 mg total) by mouth 2 (two) times daily. 06/11/20   Clarnce Flock, MD    Allergies    Patient has no known allergies.  Review of Systems   Review of Systems  Constitutional:  Positive for fever. Negative for activity change, appetite change, chills, diaphoresis and fatigue.  HENT:  Positive for congestion and sore throat. Negative for ear pain, facial swelling and sinus pain.   Eyes:  Negative for pain and visual disturbance.  Respiratory:  Negative for cough and shortness of breath.   Cardiovascular:  Negative for chest pain and palpitations.  Gastrointestinal:  Negative for abdominal pain, diarrhea, nausea and vomiting.  Genitourinary:  Negative for dysuria, flank pain, hematuria, pelvic pain and urgency.  Musculoskeletal:  Negative for arthralgias, back pain, myalgias and neck pain.  Skin:  Negative for color change and rash.  Neurological:  Positive for headaches. Negative for dizziness, seizures, syncope, facial asymmetry, speech difficulty, weakness, light-headedness and numbness.  Hematological:  Does not bruise/bleed easily.  All other systems reviewed and  are negative.  Physical Exam Updated Vital Signs BP 115/68 (BP Location: Left Arm)   Pulse 60   Temp 98.4 F (36.9 C) (Oral)   Resp 20   SpO2 100%   Physical Exam Vitals and nursing note reviewed.  Constitutional:      General: She is not in acute distress.    Appearance: She is well-developed. She is not ill-appearing, toxic-appearing or diaphoretic.  HENT:     Head: Normocephalic and atraumatic.     Right Ear: Tympanic membrane and ear canal normal.     Left Ear: Tympanic membrane and ear canal normal.  Nose: No congestion or rhinorrhea.     Mouth/Throat:     Mouth: Mucous membranes are moist. No oral lesions.     Pharynx: Oropharynx is clear. No pharyngeal swelling, oropharyngeal exudate, posterior oropharyngeal erythema or uvula swelling.     Tonsils: No tonsillar exudate or tonsillar abscesses. 1+ on the right. 1+ on the left.  Eyes:     Conjunctiva/sclera: Conjunctivae normal.  Cardiovascular:     Rate and Rhythm: Normal rate and regular rhythm.     Heart sounds: No murmur heard. Pulmonary:     Effort: Pulmonary effort is normal. No respiratory distress.     Breath sounds: Normal breath sounds.  Abdominal:     Palpations: Abdomen is soft.     Tenderness: There is no abdominal tenderness.  Musculoskeletal:     Cervical back: Neck supple.  Skin:    General: Skin is warm and dry.     Coloration: Skin is not pale.  Neurological:     General: No focal deficit present.     Mental Status: She is alert and oriented to person, place, and time.     Cranial Nerves: Cranial nerves are intact. No cranial nerve deficit, dysarthria or facial asymmetry.     Sensory: Sensation is intact. No sensory deficit.     Motor: Motor function is intact. No weakness or abnormal muscle tone.     Coordination: Coordination is intact.  Psychiatric:        Mood and Affect: Mood normal.        Behavior: Behavior normal.    ED Results / Procedures / Treatments   Labs (all labs ordered  are listed, but only abnormal results are displayed) Labs Reviewed  RESP PANEL BY RT-PCR (FLU A&B, COVID) [id number]    EKG None  Radiology No results found.  Procedures Procedures   Medications Ordered in ED Medications - No data to display  ED Course  I have reviewed the triage vital signs and the nursing notes.  Pertinent labs & imaging results that were available during my care of the patient were reviewed by me and considered in my medical decision making (see chart for details).    MDM Rules/Calculators/A&P                           Patient presents for 2 days of URI symptoms: Headache, cough, sore throat.  On exam, lungs are clear to auscultation.  Breathing is even and unlabored.  Oropharynx shows possible mild swelling but no erythema or exudates.  There is no unilateral swelling.  She has no frontal or maxillary sinus tenderness.  Patient underwent COVID and flu testing which were negative.  She was offered urine studies and chest x-ray which she declined.  Patient stated that she will return if she does have prolonged symptoms.  She was discharged in good condition.  Final Clinical Impression(s) / ED Diagnoses Final diagnoses:  Viral upper respiratory tract infection    Rx / DC Orders ED Discharge Orders     None        Godfrey Pick, MD 12/08/20 1812

## 2020-12-08 NOTE — ED Triage Notes (Signed)
Pt here via pov with reports of headaches, fevers and sore throat. Denies known sick contacts.

## 2020-12-08 NOTE — ED Notes (Addendum)
Patient Alert and oriented to baseline. Stable and ambulatory to baseline. Patient verbalized understanding of the discharge instructions.  Patient belongings were taken by the patient.   

## 2020-12-09 ENCOUNTER — Telehealth: Payer: Self-pay

## 2020-12-09 NOTE — Telephone Encounter (Signed)
Transition Care Management Unsuccessful Follow-up Telephone Call  Date of discharge and from where:  12/08/2020-[facility]  Attempts:  1st Attempt  Reason for unsuccessful TCM follow-up call:  Left voice message

## 2020-12-11 NOTE — Telephone Encounter (Signed)
Transition Care Management Unsuccessful Follow-up Telephone Call  Date of discharge and from where:  12/08/2020 from [facility]  Attempts:  2nd Attempt  Reason for unsuccessful TCM follow-up call:  Left voice message

## 2020-12-15 NOTE — Telephone Encounter (Signed)
Transition Care Management Follow-up Telephone Call Date of discharge and from where: 12/08/2020 from [facility]  How have you been since you were released from the hospital? Pt stated that she is feeling much better and did not have any questions or concerns at this time.  Any questions or concerns? No  Items Reviewed: Did the pt receive and understand the discharge instructions provided? Yes  Medications obtained and verified? Yes  Other? No  Any new allergies since your discharge? No  Dietary orders reviewed? No Do you have support at home? Yes   Functional Questionnaire: (I = Independent and D = Dependent) ADLs: I  Bathing/Dressing- I  Meal Prep- I  Eating- I  Maintaining continence- I  Transferring/Ambulation- I  Managing Meds- I   Follow up appointments reviewed:  PCP Hospital f/u appt confirmed? No   Specialist Hospital f/u appt confirmed? No  Are transportation arrangements needed? No  If their condition worsens, is the pt aware to call PCP or go to the Emergency Dept.? Yes Was the patient provided with contact information for the PCP's office or ED? Yes Was to pt encouraged to call back with questions or concerns? Yes

## 2020-12-23 DIAGNOSIS — Z3200 Encounter for pregnancy test, result unknown: Secondary | ICD-10-CM | POA: Diagnosis not present

## 2020-12-23 DIAGNOSIS — N926 Irregular menstruation, unspecified: Secondary | ICD-10-CM | POA: Diagnosis not present

## 2021-02-19 ENCOUNTER — Encounter: Payer: Self-pay | Admitting: Obstetrics & Gynecology

## 2021-02-19 ENCOUNTER — Ambulatory Visit (INDEPENDENT_AMBULATORY_CARE_PROVIDER_SITE_OTHER): Payer: Medicaid Other | Admitting: Obstetrics & Gynecology

## 2021-02-19 ENCOUNTER — Other Ambulatory Visit: Payer: Self-pay

## 2021-02-19 ENCOUNTER — Other Ambulatory Visit (HOSPITAL_COMMUNITY)
Admission: RE | Admit: 2021-02-19 | Discharge: 2021-02-19 | Disposition: A | Discharge status: Home / Self Care | Payer: Medicaid Other | Source: Ambulatory Visit | Attending: Obstetrics & Gynecology | Admitting: Obstetrics & Gynecology

## 2021-02-19 VITALS — BP 149/87 | HR 60 | Wt 257.4 lb

## 2021-02-19 DIAGNOSIS — R4589 Other symptoms and signs involving emotional state: Secondary | ICD-10-CM | POA: Diagnosis not present

## 2021-02-19 DIAGNOSIS — B9689 Other specified bacterial agents as the cause of diseases classified elsewhere: Secondary | ICD-10-CM | POA: Diagnosis not present

## 2021-02-19 DIAGNOSIS — N921 Excessive and frequent menstruation with irregular cycle: Secondary | ICD-10-CM | POA: Diagnosis not present

## 2021-02-19 DIAGNOSIS — Z975 Presence of (intrauterine) contraceptive device: Secondary | ICD-10-CM | POA: Diagnosis not present

## 2021-02-19 DIAGNOSIS — N760 Acute vaginitis: Secondary | ICD-10-CM | POA: Diagnosis not present

## 2021-02-19 DIAGNOSIS — T193XXA Foreign body in uterus, initial encounter: Secondary | ICD-10-CM

## 2021-02-19 DIAGNOSIS — T8384XA Pain from genitourinary prosthetic devices, implants and grafts, initial encounter: Secondary | ICD-10-CM | POA: Diagnosis not present

## 2021-02-19 MED ORDER — CELECOXIB 200 MG PO CAPS: 200.0000 mg | ORAL_CAPSULE | Freq: Every day | ORAL | 1 refills | Status: AC

## 2021-02-19 NOTE — Progress Notes (Signed)
GYNECOLOGY OFFICE VISIT NOTE  History:   Annette Greene is a 30 y.o. [phone] here today for evaluation of continuous spotting since delivery and postplacental Liletta IUD placement. Reports associated pelvic and lower back pain intermittently. Also reports abnormal vaginal discharge off and on. Patient also reports being down, denies any HI/SI and wants to be referred to someone who can help. Denies fevers, chills, sweats, dysuria, nausea, vomiting, other GI or GU symptoms or other general symptoms.    Past Medical History:  Diagnosis Date   Herpes    Hx of chlamydia infection    Hypertension    Incompetent cervix in pregnancy, antepartum 11/24/2016   G1 - SVD at 26 wks with painless cervical dilation G2 - prophylactic cerclage placed during pregnancy, uncomplicated NSVD at 38 wks G3 - cerclage placed 01/17/2020, taking Makena   Infection    UTI   Preterm labor     Past Surgical History:  Procedure Laterality Date   CERVICAL CERCLAGE N/A 01/25/2018   Procedure: CERCLAGE CERVICAL;  Surgeon: Everett Graff, MD;  Location: [facility];  Service: Gynecology;  Laterality: N/A;   TONSILLECTOMY AND ADENOIDECTOMY     WISDOM TOOTH EXTRACTION     all 4 teeth    The following portions of the patient's history were reviewed and updated as appropriate: allergies, current medications, past family history, past medical history, past social history, past surgical history and problem list.   Health Maintenance:  Normal pap on 02/05/2020.  Review of Systems:  Pertinent items noted in HPI and remainder of comprehensive ROS otherwise negative.  Physical Exam:  BP (!) 149/87   Pulse 60   Wt 257 lb 6.4 oz (116.8 kg)   Breastfeeding No   BMI 40.31 kg/m  CONSTITUTIONAL: Well-developed, well-nourished female in no acute distress.  HEENT:  Normocephalic, atraumatic. External right and left ear normal. No scleral icterus.  NECK: Normal range of motion, supple, no masses noted on  observation SKIN: No rash noted. Not diaphoretic. No erythema. No pallor. MUSCULOSKELETAL: Normal range of motion. No edema noted. NEUROLOGIC: Alert and oriented to person, place, and time. Normal muscle tone coordination. No cranial nerve deficit noted. PSYCHIATRIC: Normal mood and affect. Normal behavior. Normal judgment and thought content. CARDIOVASCULAR: Normal heart rate noted RESPIRATORY: Effort and breath sounds normal, no problems with respiration noted ABDOMEN: No masses noted. No other overt distention noted.  Nontender abdomen. PELVIC: Normal appearing external genitalia; normal urethral meatus; normal appearing vaginal mucosa.  Permanent suture of cerclage noted to be protruding out of her cervical canal, blue Liletta strings also visualized.  Brown discharge noted, testing sample obtained. Cervix was noted to be tender to touch. Bimanual deferred. Performed in the presence of a chaperone.  Cerclage removal: The knot of cervical cerclage was recognized and pulled out to visualize both sides of the  suture under the knot. One side was cut and the remaining suture was pulled and removed intact. There was some bleeding noted.       Assessment and Plan:     1. Breakthrough bleeding associated with intrauterine device (IUD) Patient has HTN, unable to give OCPs. Will do try of NSAIDs for BTB for now. Will check for infection/vaginitis, only check ultrasound for intrauterine location of the IUD. Will follow up results and manage accordingly. - Cervicovaginal ancillary only - US PELVIC COMPLETE WITH TRANSVAGINAL; Future - celecoxib (CELEBREX) 200 MG capsule; Take 1 capsule (200 mg total) by mouth daily for 7 days.  Dispense: 7 capsule;  Refill: 1  2. Pain due to intrauterine contraceptive device (IUD), initial encounter [facility]) Will follow up ultrasound. Celebrex should help with her pain also.  - US PELVIC COMPLETE WITH TRANSVAGINAL; Future - celecoxib (CELEBREX) 200 MG capsule; Take 1  capsule (200 mg total) by mouth daily for 7 days.  Dispense: 7 capsule; Refill: 1  3. Cervical cerclage stitch in cervical os Cerclage was placed during last pregnancy and was not removed entirely, the stitch likely got inverted in the labor and delivery process and was extruding from the os.  Removed successfully today. She may have had some pain, inflammation and abnormal discharge as a result of this being in place, hopefully her symptoms will get better with this removed.  4. Depressed mood Patient verbally consented to [facility] services about presenting concerns and psychiatric consultation as appropriate. - Amb ref to Integrated Behavioral Health  Routine preventative health maintenance measures emphasized. Please refer to After Visit Summary for other counseling recommendations.   Return for any gynecologic concerns.    I spent 20 minutes dedicated to the care of this patient including pre-visit review of records, face to face time with the patient discussing her conditions and treatments and post visit orders.    Verita Schneiders, MD, [place] for [employer], [place]

## 2021-02-20 ENCOUNTER — Ambulatory Visit (HOSPITAL_COMMUNITY)
Admission: RE | Admit: 2021-02-20 | Discharge: 2021-02-20 | Disposition: A | Discharge status: Home / Self Care | Payer: Medicaid Other | Source: Ambulatory Visit | Attending: Obstetrics & Gynecology | Admitting: Obstetrics & Gynecology

## 2021-02-20 DIAGNOSIS — T8384XA Pain from genitourinary prosthetic devices, implants and grafts, initial encounter: Secondary | ICD-10-CM | POA: Insufficient documentation

## 2021-02-20 DIAGNOSIS — Z975 Presence of (intrauterine) contraceptive device: Secondary | ICD-10-CM | POA: Diagnosis not present

## 2021-02-20 DIAGNOSIS — N921 Excessive and frequent menstruation with irregular cycle: Secondary | ICD-10-CM | POA: Diagnosis not present

## 2021-02-20 DIAGNOSIS — N83202 Unspecified ovarian cyst, left side: Secondary | ICD-10-CM | POA: Diagnosis not present

## 2021-02-20 LAB — CERVICOVAGINAL ANCILLARY ONLY
Bacterial Vaginitis (gardnerella): POSITIVE — AB
Candida Glabrata: NEGATIVE
Candida Vaginitis: NEGATIVE
Chlamydia: NEGATIVE
Comment: NEGATIVE
Comment: NEGATIVE
Comment: NEGATIVE
Comment: NEGATIVE
Comment: NEGATIVE
Comment: NORMAL
Neisseria Gonorrhea: NEGATIVE
Trichomonas: NEGATIVE

## 2021-02-21 MED ORDER — METRONIDAZOLE 500 MG PO TABS: 500.0000 mg | ORAL_TABLET | Freq: Two times a day (BID) | ORAL | 0 refills | Status: AC

## 2021-02-21 NOTE — Addendum Note (Signed)
Addended by: Verita Schneiders A on: 02/21/2021 09:57 AM   Modules accepted: Orders

## 2021-02-26 ENCOUNTER — Telehealth: Payer: Self-pay | Admitting: Clinical

## 2021-02-26 NOTE — Telephone Encounter (Signed)
Attempt to schedule, per referral; Unable to leave voicemail with busy signal; Left MyChart message for pt.

## 2021-02-27 ENCOUNTER — Ambulatory Visit (INDEPENDENT_AMBULATORY_CARE_PROVIDER_SITE_OTHER): Payer: Medicaid Other | Admitting: Clinical

## 2021-02-27 DIAGNOSIS — Z658 Other specified problems related to psychosocial circumstances: Secondary | ICD-10-CM

## 2021-02-27 DIAGNOSIS — F4323 Adjustment disorder with mixed anxiety and depressed mood: Secondary | ICD-10-CM | POA: Diagnosis not present

## 2021-02-27 NOTE — Patient Instructions (Signed)
Center for [facility] at [facility] [place], [place] [address] [phone] (main office) [phone] ([place] office)  [place]:   What if I or someone I know is in crisis?  If you are thinking about harming yourself or having thoughts of suicide, or if you know someone who is, seek help right away.  Call your doctor or mental health care provider.  Call 911 or go to a hospital emergency room to get immediate help, or ask a friend or family member to help you do these things; IF YOU ARE IN [place], YOU MAY GO TO WALK-IN URGENT CARE 24/7 at [facility] (see below)  Call the [place] National Suicide Prevention Lifeline's toll-free, 24-hour hotline at 1-800-273-TALK 918 870 5147) or TTY: 1-800-799-4 TTY 734-660-8661) to talk to a trained counselor.  If you are in crisis, make sure you are not left alone.   If someone else is in crisis, make sure he or she is not left alone   24 Hour :   [facility]  [address], [place], [place] [address] [phone] or [place] 24/7  [facility] Mobile Crisis: [phone]  [place] National Suicide Hotline: 512-206-7539  [facility] [employer] (Domestic Violence, [place])  ([phone]) [phone]  [place]  [address], [place]  [address]   [phone] or [phone]   [place]: [phone] (8am-4pm) or [phone] (after hours)        [facility], [address] [place], [place] Fax: [phone] [email or web address] *Interpreters available *Accepts all insurance and uninsured for Urgent Care needs *Accepts Medicaid and uninsured for outpatient treatment   [facility]   Mon-Fri: 8am-5pm [place], [place], [place]); [phone]) [email or web address]  *Accepts Medicare  [facility] [place] [place], Fri: 8am-4pm [place], [place], [place] [address] [phone] (phone); [phone] (fax) [email or web address]  *[place] Mon-Fri: 9am-5pm  [address] [place], [place] (phone); [phone]  [email or web address]  *Accepts Medicaid  [facility] Bl[facility] [address] Johnette Abraham [place], [place] [email or web address]   [facility], 8:30am-12pm/1pm-2:30pm [address] [place], [place] (phone); [phone] (fax) [email or web address]  *Accepts Medicaid, sliding-scale*Bilingual services available  [facility] Mon-Fri, 8am-7pm [place], [place]  [phone](phone); [phone]) [email or web address].[email or web address]  *Accepts Medicaid *Bilingual services available  [facility] Mon-Fri: 8am-5pm, Saturday by appointment only [place], [place], [place] (phone); [phone] (fax) [email or web address]   [facility] [address], [place], [place], [place] [email or web address]  *Free & reduced services for uninsured and underinsured individuals *Bilingual services for Spanish-speaking clients 21 and under  [facility], [address], [place], [place]); [phone]) [email or web address]  *Bring your own interpreter at first visit *Accepts Medicare and [facility]  [place] Mon-Fri: 9am-5:30pm [address] [place], [place], [place] (phone), [phone] (fax) After hours crisis line:  [phone] [email or web address]  *Accepts Medicare and Medicaid  [facility], 8am-6pm [address], [place], [place] (phone); [phone] (fax) [email or web address]  *Subsidized costs available  [facility] Mon-Fri: 8am-4pm [address], [place], [place] [phone](phone); [phone]) [email or web address]  *Accepts Medicaid  [facility] Same day access hours: Mon-Fri, 8:30-3pm Crisis hours: Mon-Fri,  8am-5pm [address] [place], [place] (336) [place] Same day access hours: Mon-Fri, 8:30-3pm Crisis hours: Mon-Fri, 8am-8pm [address], [place], [place] (phone); [phone] (fax) www.[email or web address]  *Accepts Medicaid and Medicare  [facility] [place] [facility], [place], Fri: 9am-9pm Tues, Thurs: 9am-6pm [place], [place], [place] (phone); [phone] (fax) [email or web address]  *(Accepts Medicare and Medicaid; payment plans available)*Bilingual services available  [facility] [address], [place], [place] (phone); [phone] (fax) [email or web address]   [facility] [address], [place], [place], [place]  [email or web address]  *Bilingual services available  [facility]) Mon-Thurs: 8am-8pm [address] [place], [place], [place] (phone); [phone] (fax) [email or web address]  *Accepts Medicaid*Bilingual services available  [facility] [place]. [place], [place] (phone) [email or web address]  *Accepts Medicaid *Bilingual services available  [facility] Mon-Fri, 9am-4:45pm [address], [place], [place] (phone); [phone]  (fax) [email or web address]  *[facility] Mon-Thurs: 8:30-8pm, Fri: 8:30am-7pm [address] [place], [place] (3rd floor) [phone] (phone); [phone] (fax) [email or web address]  *Accepts Medicaid; income-based reduced rates available  [facility] Mon-Fri: 8am-5pm [address], [place], [place] [address] [phone] (phone); [phone] (fax) [email or web address]  *Accepts Medicaid*Bilingual services available   [facility] ([place])  [address] [place] [phone] [email or web address]  *Provides direct services to individuals in recovery from mental illness, including support groups, recovery skills classes, and one on one peer support  [facility] [employer] on [place]) [place] helpline: [phone]  NAMI [place] helpline: 434-271-4599 [email or web address]://[email or web address]  *A community hub for information relating to local resources and services for the friends and families of individuals living alongside a mental health condition, as well as the individuals themselves. Classes and support groups also provided       Coping with Panic Attacks   What is a panic attack?  You may have had a panic attack if you experienced four or more of the symptoms listed below coming on abruptly and peaking in about 10 minutes.  Panic Symptoms    Pounding heart   Sweating   Trembling or shaking   Shortness of breath   Feeling of choking   Chest pain   Nausea or abdominal distress     Feeling dizzy, unsteady, lightheaded, or faint   Feelings of unreality or being detached from yourself   Fear of losing control or going crazy   Fear of dying   Numbness or tingling   Chills or hot flashes      Panic attacks are sometimes accompanied by avoidance of certain places or situations. These are often situations that would be difficult to escape from or in which help  might not be available. Examples might include crowded shopping malls, public transportation, restaurants, or driving.   Why do panic attacks occur?   Panic attacks are the body's alarm system gone awry. All of [place] have a built-in alarm system, powered by adrenaline, which increases our heart rate, breathing, and blood flow in response to danger. Ordinarily, this 'danger response system' works well. In some people, however, the response is either out of proportion to whatever stress is going on, or may come out of the blue without any stress at all.   For example, if you are walking in the woods and see a bear coming your way, a variety of changes occur in your body to prepare you to either fight the danger  or flee from the situation. Your heart rate will increase to get more blood flow around your body, your breathing rate will quicken so that more oxygen is available, and your muscles will tighten in order to be ready to fight or run. You may feel nauseated as blood flow leaves your stomach area and moves into your limbs. These bodily changes are all essential to helping you survive the dangerous situation. After the danger has passed, your body functions will begin to go back to normal. This is because your body also has a system for "recovering" by bringing your body back down to a normal state when the danger is over.   As you can see, the emergency response system is adaptive when there is, in fact, a "true" or "real" danger (e.g., bear). However, sometimes people find that their emergency response system is triggered in "everyday" situations where there really is no true physical danger (e.g., in a meeting, in the grocery store, while driving in normal traffic, etc.).   What triggers a panic attack?  Sometimes particularly stressful situations can trigger a panic attack. For example, an argument with your spouse or stressors at work can cause a stress response (activating the emergency response  system) because you perceive it as threatening or overwhelming, even if there is no direct risk to your survival.  Sometimes panic attacks don't seem to be triggered by anything in particular- they may "come out of the blue". Somehow, the natural "fight or flight" emergency response system has gotten activated when there is no real danger. Why does the body go into "emergency mode" when there is no real danger?   Often, people with panic attacks are frightened or alarmed by the physical sensations of the emergency response system. First, unexpected physical sensations are experienced (tightness in your chest or some shortness of breath). This then leads to feeling fearful or alarmed by these symptoms ("Something's wrong!", "Am I having a heart attack?", "Am I going to faint?") The mind perceives that there is a danger even though no real danger exists. This, in turn, activates the emergency response system ("fight or flight"), leading to a "full blown" panic attack. In summary, panic attacks occur when we misinterpret physical symptoms as signs of impending death, craziness, loss of control, embarrassment, or fear of fear. Sometimes you may be aware of thoughts of danger that activate the emergency response system (for example, thinking "I'm having a heart attack" when you feel chest pressure or increased heart rate). At other times, however, you may not be aware of such thoughts. After several incidences of being afraid of physical sensations, anxiety and panic can occur in response to the initial sensations without conscious thoughts of danger. Instead, you just feel afraid or alarmed. In other words, the panic or fear may seem to occur "automatically" without you consciously telling yourself anything.   After having had one or more panic attacks, you may also become more focused on what is going on inside your body. You may scan your body and be more vigilant about noticing any symptoms that might signal  the start of a panic attack. This makes it easier for panic attacks to happen again because you pick up on sensations you might otherwise not have noticed, and misinterpret them as something dangerous. A panic attack may then result.      How do I cope with panic attacks?  An important part of overcoming panic attacks involves re-interpreting your body's physical reactions and teaching  yourself ways to decrease the physical arousal. This can be done through practicing the cognitive and behavioral interventions below.   Research has found that over half of people who have panic attacks show some signs of hyperventilation or overbreathing. This can produce initial sensations that alarm you and lead to a panic attack. Overbreathing can also develop as part of the panic attack and make the symptoms worse. When people hyperventilate, certain blood vessels in the body become narrower. In particular, the brain may get slightly less oxygen. This can lead to the symptoms of dizziness, confusion, and lightheadedness that often occur during panic attacks. Other parts of the body may also get a bit less oxygen, which may lead to numbness or tingling in the hands or feet or the sensation of cold, clammy hands. It also may lead the heart to pump harder. Although these symptoms may be frightening and feel unpleasant, it is important to remember that hyperventilating is not dangerous. However, you can help overcome the unpleasantness of overbreathing by practicing Breathing Retraining.   Practice this basic technique three times a day, every day:   Inhale. With your shoulders relaxed, inhale as slowly and deeply as you can while you count to six. If you can, use your diaphragm to fill your lungs with air.   Hold. Keep the air in your lungs as you slowly count to four.   Exhale. Slowly breath out as you count to six.   Repeat. Do the inhale-hold-exhale cycle several times. Each time you do it, exhale for longer  counts.  Like any new skill, Breathing Retraining requires practice. Try practicing this skill twice a day for several minutes. Initially, do not try this technique in specific situations or when you become frightened or have a panic attack. Begin by practicing in a quiet environment to build up your skill level so that you can later use it in time of "emergency."   2. Decreasing Avoidance  Regardless of whether you can identify why you began having panic attacks or whether they seemed to come out of the blue, the places where you began having panic attacks often can become triggers themselves. It is not uncommon for individuals to begin to avoid the places where they have had panic attacks. Over time, the individual may begin to avoid more and more places, thereby decreasing their activities and often negatively impacting their quality of life. To break the cycle of avoidance, it is important to first identify the places or situations that are being avoided, and then to do some "relearning."  To begin this intervention, first create a list of locations or situations that you tend to avoid. Then choose an avoided location or situation that you would like to target first. Now develop an "exposure hierarchy" for this situation or location. An "exposure hierarchy" is a list of actions that make you feel anxious in this situation. Order these actions from least to most anxiety-producing. It is often helpful to have the first item on your hierarchy involve thinking or imagining part of the feared/avoided situation.   Here is an example of an exposure hierarchy for decreasing avoidance of the grocery store. Note how it is ordered from the least amount of anxiety (at the top) to the most anxiety (at the bottom):   Think about going to the grocery store alone.   Go to the grocery store with a friend or family member.   Go to the grocery store alone to pick up a few small  items (5-10 minutes in the store).    Shopping for 10-20 minutes in the store alone.   Doing the shopping for the week by myself (20-30 minutes in the store).   Your homework is to "expose" yourself to the lowest item on your hierarchy and use your breathing relaxation and coping statements (see below) to help you remain in the situation. Practice this several times during the upcoming week. Once you have mastered each item with minimal anxiety, move on to the next higher action on your list.   Cognitive Interventions  Identify your negative self-talk Anxious thoughts can increase anxiety symptoms and panic. The first step in changing anxious thinking is to identify your own negative, alarming self-talk. Some common alarming thoughts:  I'm having a heart attack.            I must be going crazy. I think I'm dying. People will think I'm crazy. I'm going to pass our.  Oh no- here it comes.  I can't stand this.  I've got to get out of here!  2. Use positive coping statements Changing or disrupting a pattern of anxious thoughts by replacing them with more calming or supportive statements can help to divert a panic attack. Some common helpful coping statements:  This is not an emergency.  I don't like feeling this way, but I can accept it.  I can feel like this and still be okay.  This has happened before, and I was okay. I'll be okay this time, too.  I can be anxious and still deal with this situation.   /Emotional Wellbeing Apps and Websites Here are a few free apps meant to help you to help yourself.  To find, try searching on the internet to see if the app is offered on Apple/Android devices. If your first choice doesn't come up on your device, the good news is that there are many choices! Play around with different apps to see which ones are helpful to you.    Calm This is an app meant to help increase calm feelings. Includes info, strategies, and tools for tracking your feelings.      Calm Harm  This app is meant to  help with self-harm. Provides many 5-minute or 15-min coping strategies for doing instead of hurting yourself.       [place]ryn is a problem-solving tool to help deal with emotions and cope with stress you encounter wherever you are.      MindShift This app can help people cope with anxiety. Rather than trying to avoid anxiety, you can make an important shift and face it.      MY3  MY3 features a support system, safety plan and resources with the goal of offering a tool to use in a time of need.       My Life My Voice  This mood journal offers a simple solution for tracking your thoughts, feelings and moods. Animated emoticons can help identify your mood.       Relax Melodies Designed to help with sleep, on this app you can mix sounds and meditations for relaxation.      Smiling Mind Smiling Mind is meditation made easy: it's a simple tool that helps put a smile on your mind.        Stop, Breathe & Think  A friendly, simple guide for people through meditations for mindfulness and compassion.  Stop, Breathe and Think Kids Enter your current feelings and choose a "mission"  to help you cope. Offers videos for certain moods instead of just sound recordings.       Team Orange The goal of this tool is to help teens change how they think, act, and react. This app helps you focus on your own good feelings and experiences.      The [place] Box The [place] Box (VHB) contains simple tools to help patients with coping, relaxation, distraction, and positive thinking.

## 2021-02-27 NOTE — BH Specialist Note (Signed)
Integrated Behavioral Health via Telemedicine Visit  02/27/2021 Annette Greene [id number]  Number of [facility] visits: 1 Session Start time: 10:23  Session End time: 10:43 Total time: 20  Referring Provider: Verita Schneiders, MD Patient/Family location: Home [facility] Provider location: [facility] [place]  All persons participating in visit: [name] and Annette Greene   Types of Service: Individual psychotherapy and Telephone visit  I connected with Annette Greene and/or Annette Greene's  n/a  via  Telephone or Geologist, engineering  (Video is Caregility application) and verified that I am speaking with the correct person using two identifiers. Discussed confidentiality: Yes   I discussed the limitations of telemedicine and the availability of in person appointments.  Discussed there is a possibility of technology failure and discussed alternative modes of communication if that failure occurs.  I discussed that engaging in this telemedicine visit, they consent to the provision of behavioral healthcare and the services will be billed under their insurance.  Patient and/or legal guardian expressed understanding and consented to Telemedicine visit: Yes   Presenting Concerns: Patient and/or family reports the following symptoms/concerns: Feeling overwhelmed, sleep difficulty, anxiety with panic after losing job and caring for three young children (97mo, 2yo; 75yo); open to referral for ongoing therapy and self-coping strategy to implement today to help manage emotions Duration of problem: Increase after job loss; Severity of problem: moderate  Patient and/or Family's Strengths/Protective Factors: Sense of purpose  Goals Addressed: Patient will:  Reduce symptoms of: anxiety, depression, insomnia, and stress   Increase knowledge and/or ability of: coping skills   Demonstrate ability to: Increase  healthy adjustment to current life circumstances  Progress towards Goals: Ongoing  Interventions: Interventions utilized:  Mindfulness or Psychologist, educational and Psychoeducation and/or Health Education Standardized Assessments completed:  PHQ9/GAD7 given in past two weeks  Patient and/or Family Response: Pt agrees with treatment plan  Assessment: Patient currently experiencing Adjustment disorder with mixed anxious and depressed mood and Psychosocial stress.   Patient may benefit from psychoeducation and brief therapeutic interventions regarding coping with symptoms of anxiety with panic, depression, life stress .  Plan: Follow up with behavioral health clinician on : Two weeks; Call Annette Greene at [phone], as needed Behavioral recommendations:  -Accept referral to [facility] for ongoing therapy -CALM relaxation breathing exercise twice daily (morning; at bedtime with sleep sounds) -Read educational materials regarding coping with symptoms of anxiety with panic (on After Visit Summary)  Referral(s): Integrated Orthoptist (In Clinic) and [facility] (LME/Outside Clinic)  I discussed the assessment and treatment plan with the patient and/or parent/guardian. They were provided an opportunity to ask questions and all were answered. They agreed with the plan and demonstrated an understanding of the instructions.   They were advised to call back or seek an in-person evaluation if the symptoms worsen or if the condition fails to improve as anticipated.  Annette Fair, LCSW  Depression screen [facility] 2/9 02/19/2021 07/02/2020 06/23/2020 06/11/2020 05/06/2020  Decreased Interest 1 0 0 0 0  Down, Depressed, Hopeless 1 0 0 0 0  PHQ - 2 Score 2 0 0 0 0  Altered sleeping 0 0 0 0 0  Tired, decreased energy 1 0 0 0 0  Change in appetite 2 0 0 0 0  Feeling bad or failure about yourself  0 0 0 0 0  Trouble concentrating 0 0 0 0 0  Moving slowly or fidgety/restless  1 0  0 0 0  Suicidal thoughts 0 0 0 0 0  PHQ-9 Score 6 0 0 0 0  Some recent data might be hidden   GAD 7 : Generalized Anxiety Score 02/19/2021 07/02/2020 06/23/2020 05/06/2020  Nervous, Anxious, on Edge 1 0 0 0  Control/stop worrying 0 0 0 0  Worry too much - different things 1 0 0 0  Trouble relaxing 0 0 0 0  Restless 1 0 0 0  Easily annoyed or irritable 1 0 0 0  Afraid - awful might happen 0 0 0 0  Total GAD 7 Score 4 0 0 0

## 2021-03-02 NOTE — BH Specialist Note (Signed)
Pt did not arrive to video visit and did not answer the phone; Left HIPPA-compliant message to call back Elesa Garman from [facility] at  [phone] (Miriam Liles's office).  ?; left MyChart message for patient.  ? ?

## 2021-03-11 ENCOUNTER — Ambulatory Visit: Payer: Medicaid Other | Admitting: Clinical

## 2021-03-11 DIAGNOSIS — F4323 Adjustment disorder with mixed anxiety and depressed mood: Secondary | ICD-10-CM

## 2021-03-11 DIAGNOSIS — Z658 Other specified problems related to psychosocial circumstances: Secondary | ICD-10-CM

## 2021-04-29 DIAGNOSIS — H5213 Myopia, bilateral: Secondary | ICD-10-CM | POA: Diagnosis not present

## 2021-05-27 DIAGNOSIS — H5213 Myopia, bilateral: Secondary | ICD-10-CM | POA: Diagnosis not present

## 2021-05-27 DIAGNOSIS — H52223 Regular astigmatism, bilateral: Secondary | ICD-10-CM | POA: Diagnosis not present

## 2021-09-02 ENCOUNTER — Ambulatory Visit: Payer: Medicaid Other | Admitting: Obstetrics and Gynecology

## 2021-12-18 DIAGNOSIS — G514 Facial myokymia: Secondary | ICD-10-CM | POA: Diagnosis not present

## 2022-03-04 DIAGNOSIS — H66002 Acute suppurative otitis media without spontaneous rupture of ear drum, left ear: Secondary | ICD-10-CM | POA: Diagnosis not present

## 2022-03-04 DIAGNOSIS — J069 Acute upper respiratory infection, unspecified: Secondary | ICD-10-CM | POA: Diagnosis not present

## 2022-03-12 ENCOUNTER — Ambulatory Visit (INDEPENDENT_AMBULATORY_CARE_PROVIDER_SITE_OTHER): Payer: Medicaid Other | Admitting: Student

## 2022-03-12 ENCOUNTER — Encounter: Payer: Self-pay | Admitting: Student

## 2022-03-12 VITALS — BP 129/81 | HR 58 | Ht 67.0 in | Wt 259.8 lb

## 2022-03-12 DIAGNOSIS — Z8619 Personal history of other infectious and parasitic diseases: Secondary | ICD-10-CM

## 2022-03-12 DIAGNOSIS — H6690 Otitis media, unspecified, unspecified ear: Secondary | ICD-10-CM

## 2022-03-12 DIAGNOSIS — L732 Hidradenitis suppurativa: Secondary | ICD-10-CM

## 2022-03-12 DIAGNOSIS — R030 Elevated blood-pressure reading, without diagnosis of hypertension: Secondary | ICD-10-CM | POA: Diagnosis not present

## 2022-03-12 MED ORDER — METFORMIN HCL ER 500 MG PO TB24: 500.0000 mg | ORAL_TABLET | Freq: Every day | ORAL | 3 refills | Status: DC

## 2022-03-12 MED ORDER — CLINDAMYCIN PHOSPHATE 1 % EX GEL: Freq: Two times a day (BID) | CUTANEOUS | 3 refills | Status: DC

## 2022-03-12 NOTE — Progress Notes (Signed)
    SUBJECTIVE:   CHIEF COMPLAINT / HPI:  Pt presents to establish care and discuss the following:  Hidradenitis suppurativa Has fare ups often. Better if she stays away from scented soaps/lotions. Doesn't apply any medication to the lesions currently. Has two lesions on thighs currently, not draining, a little painful. Would like topical abx.   HSV 2 Pt states she hasn't had an out break since 2013 however on chart review it looks like she was treated for an outbreak in 2018. Not on suppressive therapy.   Ear infection  Went to [facility] urgent care last week and was given 10 days of amoxicillin for ear infection . She is still taking the amoxicillin and has not missed any doses.  States her ear still hurting a little and would like me to look at it today.  Has 3 children ages 84, 17, and 5. She is a Secondary school teacher.  PERTINENT  PMH / PSH: HS, HSV  OBJECTIVE:   Vitals:   03/12/22 1541 03/12/22 1608  BP: (!) 140/82 129/81  Pulse: 60 (!) 58  SpO2: 100% 100%    General: NAD, pleasant, able to participate in exam HEENT: White sclera, clear conjunctiva, left TM erythematous and slightly bulging, right TM pearly gray with cone of light present and nonbulging Cardiac: RRR, no murmurs. Respiratory: Breathing comfortably on room air Extremities: no edema  Skin: warm and dry Neuro: alert, no obvious focal deficits Psych: Normal affect and mood  ASSESSMENT/PLAN:   Hidradenitis suppurativa -Apply clindamycin gel twice daily in areas with recurrent flares -Start metformin 500 mg daily which may help reduce flareups and have the added benefit of weight loss -Return in 1 month to follow-up, can consider increasing metformin to twice daily  History of herpes simplex type 2 infection Cannot find laboratory confirmation that patient has HSV.  If she has outbreak, will attempt to get sample for testing.  Patient states she does not want suppressive therapy as she has not had an outbreak  in several years.  Elevated blood pressure reading BP initially elevated but came down upon repeat.  Will continue to monitor.  Patient is returning in 1 month for follow-up.  Acute otitis media Advised patient to continue with current plan from urgent care, to finish amoxicillin and continue using Flonase daily.  She can take OTC ibuprofen for pain     Dr. Precious Gilding, [place]

## 2022-03-12 NOTE — Patient Instructions (Addendum)
It was great to see you! Thank you for allowing me to participate in your care!  Our plans for today:  - I sent in a prescription for clindamycin gel to your pharmacy.  You can apply this twice daily to areas where you have HS flares to help prevent/reduce them.  - Return as needed. Next pap smear is due in November of 2024   Take care and seek immediate care sooner if you develop any concerns.   Dr. Precious Gilding, DO [facility]

## 2022-03-12 NOTE — Assessment & Plan Note (Signed)
BP initially elevated but came down upon repeat.  Will continue to monitor.  Patient is returning in 1 month for follow-up.

## 2022-03-12 NOTE — Assessment & Plan Note (Addendum)
Cannot find laboratory confirmation that patient has HSV.  If she has outbreak, will attempt to get sample for testing.  Patient states she does not want suppressive therapy as she has not had an outbreak in several years.

## 2022-03-12 NOTE — Assessment & Plan Note (Signed)
-  Apply clindamycin gel twice daily in areas with recurrent flares -Start metformin 500 mg daily which may help reduce flareups and have the added benefit of weight loss -Return in 1 month to follow-up, can consider increasing metformin to twice daily

## 2022-03-12 NOTE — Assessment & Plan Note (Addendum)
Advised patient to continue with current plan from urgent care, to finish amoxicillin and continue using Flonase daily.  She can take OTC ibuprofen for pain

## 2022-03-18 ENCOUNTER — Telehealth: Payer: Self-pay

## 2022-03-18 NOTE — Telephone Encounter (Signed)
A Prior Authorization was initiated for this patients CLINDAMYCIN PHOSPHATE 1% GEL through CoverMyMeds.   Key: [employer]

## 2022-03-19 ENCOUNTER — Encounter: Payer: Self-pay | Admitting: Student

## 2022-03-19 ENCOUNTER — Other Ambulatory Visit: Payer: Self-pay | Admitting: Student

## 2022-03-19 MED ORDER — CLINDAMYCIN PHOSPHATE 1 % EX SOLN: Freq: Two times a day (BID) | CUTANEOUS | 0 refills | Status: DC

## 2022-03-19 NOTE — Telephone Encounter (Signed)
Prior Auth for patients medication CLINDAMYCIN PHOSPHATE 1% GEL denied by [employer] via CoverMyMeds.   Reason: PT MUST TRY AND FAIL ALTERNATIVE CLINDAMYCIN PLEDGET/SOLUTION.  Denial letter scanned to pt media.  CoverMyMeds Key: [employer]

## 2022-04-13 ENCOUNTER — Ambulatory Visit: Payer: Medicaid Other | Admitting: Student

## 2022-04-13 NOTE — Progress Notes (Deleted)
    SUBJECTIVE:   CHIEF COMPLAINT / HPI:   Hidradenitis suppurativa She was started on clindamycin gel twice daily and metformin 500 mg daily.  Today she states***  PERTINENT  PMH / PSH: ***  OBJECTIVE:   There were no vitals taken for this visit. ***  General: NAD, pleasant, able to participate in exam Cardiac: RRR, no murmurs. Respiratory: CTAB, normal effort, No wheezes, rales or rhonchi Abdomen: Bowel sounds present, nontender, nondistended, no hepatosplenomegaly. Extremities: no edema or cyanosis. Skin: warm and dry, no rashes noted Neuro: alert, no obvious focal deficits Psych: Normal affect and mood  ASSESSMENT/PLAN:   No problem-specific Assessment & Plan notes found for this encounter.     Dr. Precious Gilding, [place]    {    This will disappear when note is signed, click to select method of visit    :1}

## 2022-05-04 DIAGNOSIS — M549 Dorsalgia, unspecified: Secondary | ICD-10-CM | POA: Diagnosis not present

## 2022-05-05 ENCOUNTER — Encounter (HOSPITAL_COMMUNITY): Payer: Self-pay

## 2022-05-05 ENCOUNTER — Ambulatory Visit (HOSPITAL_COMMUNITY)
Admission: EM | Admit: 2022-05-05 | Discharge: 2022-05-05 | Disposition: A | Discharge status: Home / Self Care | Payer: Medicaid Other | Attending: Family Medicine | Admitting: Family Medicine

## 2022-05-05 ENCOUNTER — Ambulatory Visit (INDEPENDENT_AMBULATORY_CARE_PROVIDER_SITE_OTHER): Payer: Medicaid Other

## 2022-05-05 DIAGNOSIS — M545 Low back pain, unspecified: Secondary | ICD-10-CM

## 2022-05-05 MED ORDER — KETOROLAC TROMETHAMINE 30 MG/ML IJ SOLN
30.0000 mg | Freq: Once | INTRAMUSCULAR | Status: AC
  Administered 2022-05-05: 30 mg via INTRAMUSCULAR

## 2022-05-05 MED ORDER — KETOROLAC TROMETHAMINE 30 MG/ML IJ SOLN
INTRAMUSCULAR | Status: AC
  Filled 2022-05-05: qty 1

## 2022-05-05 NOTE — ED Triage Notes (Signed)
Pt is here for back pain shooting down to both legs causing pain and discomfort x 3days

## 2022-05-05 NOTE — ED Provider Notes (Signed)
[facility]    CSN: [id number] Arrival date & time: 05/05/22  1915      History   Chief Complaint Chief Complaint  Patient presents with   Back Pain    Entered by patient    HPI Annette Greene is a 32 y.o. female.    Back Pain  Here for mid low back pain that began about 3 days ago.  No recent injury or trauma.  She states she has had back pain off and on since 2021 since her last childbirth.  No fever or rash.  No malaise or myalgia and no dysuria.  She states she has not tried anything at this time, but she was prescribed ibuprofen and muscle relaxers about a month ago.  She states they do help when she takes them but the pain comes right back. Last menstrual cycle was about 7 days ago.     Past Medical History:  Diagnosis Date   Herpes    Hx of chlamydia infection    Hypertension    Incompetent cervix in pregnancy, antepartum 11/24/2016   G1 - SVD at 26 wks with painless cervical dilation G2 - prophylactic cerclage placed during pregnancy, uncomplicated NSVD at 38 wks G3 - cerclage placed 01/17/2020, taking Makena   Infection    UTI   Preterm labor     Patient Active Problem List   Diagnosis Date Noted   Elevated blood pressure reading 03/12/2022   Acute otitis media 03/12/2022   Liletta IUD (intrauterine device) in place since 07/03/20 07/03/2020   Ovarian teratomas bilaterally, 2 cm in size 05/06/2020   Hidradenitis suppurativa 12/19/2018   Pancreatitis 10/19/2018   Human papilloma virus infection 06/02/2018   Irregular periods 06/02/2018   Obesity, morbid ([place]) 12/27/2017   History of herpes simplex type 2 infection 12/27/2017    Past Surgical History:  Procedure Laterality Date   CERVICAL CERCLAGE N/A 01/25/2018   Procedure: CERCLAGE CERVICAL;  Surgeon: Everett Graff, MD;  Location: [place] [facility];  Service: Gynecology;  Laterality: N/A;   TONSILLECTOMY AND ADENOIDECTOMY     WISDOM TOOTH EXTRACTION     all 4 teeth    OB History      Gravida  3   Para  3   Term  2   Preterm  1   AB      Living  3      SAB      IAB      Ectopic      Multiple  0   Live Births  3            Home Medications    Prior to Admission medications   Medication Sig Start Date End Date Taking? Authorizing Provider  esomeprazole (NEXIUM) 20 MG capsule Take 20 mg by mouth daily at 12 noon.   Yes [provider]    Family History Family History  Problem Relation Age of Onset   Healthy Mother    Hypertension Mother    Healthy Father    Healthy Sister    Healthy Maternal Grandmother    Hypertension Maternal Grandmother    Healthy Paternal Grandmother    Healthy Paternal Grandfather    Healthy Sister    Drug abuse Maternal Aunt    Hypertension Maternal Aunt    Diabetes Maternal Aunt     Social History Social History   Tobacco Use   Smoking status: Former    Packs/day: 0.50    Types: Cigarettes  Quit date: 06/24/2016    Years since quitting: 5.8   Smokeless tobacco: Never   Tobacco comments:    stopped since pregnant  Vaping Use   Vaping Use: Never used  Substance Use Topics   Alcohol use: Not Currently    Comment: stopped with pregnancy   Drug use: Not Currently     Allergies   Patient has no known allergies.   Review of Systems Review of Systems  Musculoskeletal:  Positive for back pain.     Physical Exam Triage Vital Signs ED Triage Vitals  Enc Vitals Group     BP 05/05/22 1938 127/83     Pulse Rate 05/05/22 1938 67     Resp 05/05/22 1938 16     Temp 05/05/22 1938 98.2 F (36.8 C)     Temp Source 05/05/22 1938 Oral     SpO2 05/05/22 1938 97 %     Weight --      Height --      Head Circumference --      Peak Flow --      Pain Score 05/05/22 1936 10     Pain Loc --      Pain Edu? --      Excl. in [place]? --    No data found.  Updated Vital Signs BP 127/83 (BP Location: Right Arm)   Pulse 67   Temp 98.2 F (36.8 C) (Oral)   Resp 16   LMP 05/03/2022   SpO2 97%    Visual Acuity Right Eye Distance:   Left Eye Distance:   Bilateral Distance:    Right Eye Near:   Left Eye Near:    Bilateral Near:     Physical Exam Vitals reviewed.  Constitutional:      General: She is not in acute distress.    Appearance: She is not ill-appearing, toxic-appearing or diaphoretic.  HENT:     Mouth/Throat:     Mouth: Mucous membranes are moist.  Eyes:     Extraocular Movements: Extraocular movements intact.     Pupils: Pupils are equal, round, and reactive to light.  Cardiovascular:     Rate and Rhythm: Normal rate and regular rhythm.     Heart sounds: No murmur heard. Pulmonary:     Effort: Pulmonary effort is normal.     Breath sounds: Normal breath sounds.  Musculoskeletal:        General: Tenderness (midline LS area) present.  Skin:    Coloration: Skin is not jaundiced or pale.  Neurological:     General: No focal deficit present.     Mental Status: She is alert and oriented to person, place, and time.  Psychiatric:        Behavior: Behavior normal.      UC Treatments / Results  Labs (all labs ordered are listed, but only abnormal results are displayed) Labs Reviewed - No data to display  EKG   Radiology DG Lumbar Spine 2-3 Views  Result Date: 05/05/2022 CLINICAL DATA:  Midline low back pain. Back pain shooting down to both legs for 3 days. EXAM: LUMBAR SPINE - 2-3 VIEW COMPARISON:  None Available. FINDINGS: There is no evidence of lumbar spine fracture. Mild scoliosis in the upper lumbar spine. Alignment is normal. Intervertebral disc spaces are maintained. IMPRESSION: No acute abnormality. Electronically Signed   By: Brett Fairy M.D.   On: 05/05/2022 20:08    Procedures Procedures (including critical care time)  Medications Ordered in UC Medications  ketorolac (  TORADOL) 30 MG/ML injection 30 mg (has no administration in time range)    Initial Impression / Assessment and Plan / UC Course  I have reviewed the triage vital signs  and the nursing notes.  Pertinent labs & imaging results that were available during my care of the patient were reviewed by me and considered in my medical decision making (see chart for details).       Spine x-rays done as the main thing she wants to make sure is that there is nothing serious going on.  X-rays are benign and do not show any bony abnormality.  She is good to go ahead and fill the muscle relaxer she has and take those in the scription ibuprofen.  I have asked her to follow-up with her primary care so that they can see what else she needs to--whether it is more evaluation or physical therapy and other treatments. Final Clinical Impressions(s) / UC Diagnoses   Final diagnoses:  Acute midline low back pain without sciatica     Discharge Instructions      Your x-rays did not show any bony abnormalities   You have been given a shot of Toradol 30 mg today.  Feel the medication you have already been prescribed and take them as directed.  Please follow-up with your primary care, so they can help you decide if you need further evaluation or physical therapy or other treatments.       ED Prescriptions   None    PDMP not reviewed this encounter.   Barrett Henle, MD 05/05/22 2018

## 2022-05-05 NOTE — Discharge Instructions (Signed)
Your x-rays did not show any bony abnormalities   You have been given a shot of Toradol 30 mg today.  Feel the medication you have already been prescribed and take them as directed.  Please follow-up with your primary care, so they can help you decide if you need further evaluation or physical therapy or other treatments.

## 2022-07-06 ENCOUNTER — Encounter: Payer: Self-pay | Admitting: Family Medicine

## 2022-07-06 ENCOUNTER — Telehealth: Payer: Self-pay | Admitting: Family Medicine

## 2022-07-06 ENCOUNTER — Ambulatory Visit: Payer: Medicaid Other | Admitting: Family Medicine

## 2022-07-06 VITALS — BP 127/77 | HR 75 | Ht 67.0 in | Wt 248.0 lb

## 2022-07-06 DIAGNOSIS — R197 Diarrhea, unspecified: Secondary | ICD-10-CM | POA: Diagnosis not present

## 2022-07-06 DIAGNOSIS — L732 Hidradenitis suppurativa: Secondary | ICD-10-CM | POA: Diagnosis not present

## 2022-07-06 DIAGNOSIS — J029 Acute pharyngitis, unspecified: Secondary | ICD-10-CM | POA: Diagnosis not present

## 2022-07-06 DIAGNOSIS — J028 Acute pharyngitis due to other specified organisms: Secondary | ICD-10-CM

## 2022-07-06 LAB — POC SOFIA 2 FLU + SARS ANTIGEN FIA
Influenza A, POC: NEGATIVE
Influenza B, POC: NEGATIVE
SARS Coronavirus 2 Ag: NEGATIVE

## 2022-07-06 MED ORDER — CLINDAMYCIN PHOSPHATE 1 % EX SOLN: Freq: Two times a day (BID) | CUTANEOUS | 0 refills | Status: DC

## 2022-07-06 NOTE — Patient Instructions (Addendum)
It was nice seeing you. You likely have viral infection. Your COVID and flu tests are negative. Please use Tylenol as needed for pain.

## 2022-07-06 NOTE — Assessment & Plan Note (Signed)
Likely viral pharyngitis. Strep unlikely due to absence of tonsils and picture does not fit strep pharyngitis. Centor score = 0 Neg rapid flu and covid test. Tylenol prn pain recommended. Continue Mucinex prn cough. Keep well hydrated and f/u as needed. She agreed with the plan.

## 2022-07-06 NOTE — Progress Notes (Signed)
SUBJECTIVE:   CHIEF COMPLAINT / HPI:   Sore Throat  This is a new problem. Episode onset: Started 3 days ago. The problem has been gradually worsening. There has been no fever. The pain is at a severity of 10/10. Associated symptoms include diarrhea, ear pain and trouble swallowing. Pertinent negatives include no abdominal pain, coughing, headaches, hoarse voice, shortness of breath or vomiting. Associated symptoms comments: Diarrhea started today. Cough which she thought was due to her sinus. She also developed body aches today. Left ear pain with no drainage started a few days ago as well. Not currently coughing a lot.. Exposure to: Mother is sick as well. Treatments tried: Mucinex, Claritin helped a bit.  Diarrhea  This is a new problem. The current episode started today. The problem has been unchanged. Diarrhea characteristics: No blood in the stool. The patient states that diarrhea does not awaken her from sleep. Pertinent negatives include no abdominal pain, bloating, coughing, fever, headaches or vomiting. Nothing aggravates the symptoms.   Hidradenitis:  She stated that she was started on Metformin and Clindagel for her Hidradenitis, which affects her axilla and now her groin. However, the pharmacy canceled this because she did not pick them up on time. She requested a refill of this medication.   PERTINENT  PMH / PSH: PMHx reviewed  OBJECTIVE:   BP 127/77   Pulse 75   Ht  (1.702 m)   Wt 248 lb (112.5 kg)   LMP 06/19/2022   SpO2 100%   BMI 38.84 kg/m   Physical Exam Vitals and nursing note reviewed.  Constitutional:      Appearance: Normal appearance.  HENT:     Right Ear: Tympanic membrane, ear canal and external ear normal. There is no impacted cerumen.     Left Ear: Tympanic membrane, ear canal and external ear normal. There is no impacted cerumen.     Mouth/Throat:     Pharynx: No oropharyngeal exudate.     Comments: Mild erythema of her soft palate. No  tonsils Eyes:     Conjunctiva/sclera: Conjunctivae normal.  Cardiovascular:     Rate and Rhythm: Normal rate and regular rhythm.     Heart sounds: Normal heart sounds. No murmur heard. Pulmonary:     Effort: Pulmonary effort is normal. No respiratory distress.     Breath sounds: Normal breath sounds. No wheezing.  Abdominal:     General: Bowel sounds are normal.     Palpations: Abdomen is soft. There is no mass.     Tenderness: There is no abdominal tenderness. There is no guarding.  Musculoskeletal:     Cervical back: Neck supple.  Lymphadenopathy:     Cervical: No cervical adenopathy.  Skin:    Comments: Deferred      ASSESSMENT/PLAN:   Pharyngitis Likely viral pharyngitis. Strep unlikely due to absence of tonsils and picture does not fit strep pharyngitis. Centor score = 0 Neg rapid flu and covid test. Tylenol prn pain recommended. Continue Mucinex prn cough. Keep well hydrated and f/u as needed. She agreed with the plan.   Diarrhea Likely part of viral syndrome. She is well hydrated. Continue oral hydration and feed as tolerated. F/U if symptoms worsens. She agreed with the plan.   Hidradenitis suppurativa She needs her Metformin and Clidamycin refills, which she never picked up. None of these meds were on her medication list. I advised that I would send in refills after the record review. PCP's documentation and previous documentation regarding  her HS were reviewed. Per the most recent note, PCP started her on Metformin and Clindamycin. I called her pharmacy, and they still have her Metformin. However, her clindagel isn't covered.  I escribed Clindamycin solution.  I called to inform the patient of this and left a message for her to call back. She will f/u with her PCP soon for reassessment and medication management.     Janit Pagan, MD [facility]

## 2022-07-06 NOTE — Telephone Encounter (Signed)
HIPAA compliant callback message left.  Please advise her that her medication is at her pharmacy for pick up. She should call pharmacy for pick up. Thanks.

## 2022-07-06 NOTE — Assessment & Plan Note (Signed)
Likely part of viral syndrome. She is well hydrated. Continue oral hydration and feed as tolerated. F/U if symptoms worsens. She agreed with the plan.

## 2022-07-06 NOTE — Assessment & Plan Note (Signed)
She needs her Metformin and Clidamycin refills, which she never picked up. None of these meds were on her medication list. I advised that I would send in refills after the record review. PCP's documentation and previous documentation regarding her HS were reviewed. Per the most recent note, PCP started her on Metformin and Clindamycin. I called her pharmacy, and they still have her Metformin. However, her clindagel isn't covered.  I escribed Clindamycin solution.  I called to inform the patient of this and left a message for her to call back. She will f/u with her PCP soon for reassessment and medication management.

## 2022-07-14 DIAGNOSIS — J069 Acute upper respiratory infection, unspecified: Secondary | ICD-10-CM | POA: Diagnosis not present

## 2022-07-14 DIAGNOSIS — R591 Generalized enlarged lymph nodes: Secondary | ICD-10-CM | POA: Diagnosis not present

## 2022-10-27 ENCOUNTER — Ambulatory Visit (INDEPENDENT_AMBULATORY_CARE_PROVIDER_SITE_OTHER): Payer: Medicaid Other

## 2022-10-27 ENCOUNTER — Other Ambulatory Visit (HOSPITAL_COMMUNITY)
Admission: RE | Admit: 2022-10-27 | Discharge: 2022-10-27 | Disposition: A | Discharge status: Home / Self Care | Payer: Medicaid Other | Source: Ambulatory Visit | Attending: Family Medicine | Admitting: Family Medicine

## 2022-10-27 ENCOUNTER — Other Ambulatory Visit: Payer: Self-pay

## 2022-10-27 VITALS — BP 145/83 | HR 76 | Ht 67.0 in | Wt 253.9 lb

## 2022-10-27 DIAGNOSIS — Z113 Encounter for screening for infections with a predominantly sexual mode of transmission: Secondary | ICD-10-CM | POA: Insufficient documentation

## 2022-10-27 NOTE — Progress Notes (Signed)
Pt here today for STD screening.  Pt also requests to have HIV, RPR, Hep B/C as well.  Pt explained how to obtain self swab and that we will call with abnormal results.  Pt verbalized understanding with no further questions.  Leonette Nutting  10/27/22

## 2022-10-28 ENCOUNTER — Telehealth: Payer: Self-pay | Admitting: *Deleted

## 2022-10-28 DIAGNOSIS — B3731 Acute candidiasis of vulva and vagina: Secondary | ICD-10-CM

## 2022-10-28 DIAGNOSIS — N760 Acute vaginitis: Secondary | ICD-10-CM

## 2022-10-28 MED ORDER — FLUCONAZOLE 150 MG PO TABS: 150.0000 mg | ORAL_TABLET | Freq: Once | ORAL | 0 refills | Status: AC

## 2022-10-28 MED ORDER — METRONIDAZOLE 500 MG PO TABS: 500.0000 mg | ORAL_TABLET | Freq: Two times a day (BID) | ORAL | 0 refills | Status: AC

## 2022-10-28 NOTE — Telephone Encounter (Signed)
I called Annette Greene and informed her of results and recommendations. She would like treatment. RX sent to pharmacy and explained instructions. She voices understanding. Nancy Fetter

## 2022-10-28 NOTE — Telephone Encounter (Signed)
-----   Message from Warden Fillers sent at 10/28/2022 10:38 AM EDT ----- BV and yeast noted on swab, offer treatment, remainder of STD screen normal

## 2022-12-03 ENCOUNTER — Ambulatory Visit: Payer: Medicaid Other | Admitting: Obstetrics and Gynecology

## 2022-12-03 ENCOUNTER — Encounter: Payer: Self-pay | Admitting: Obstetrics and Gynecology

## 2022-12-03 DIAGNOSIS — N898 Other specified noninflammatory disorders of vagina: Secondary | ICD-10-CM | POA: Insufficient documentation

## 2022-12-03 DIAGNOSIS — R8281 Pyuria: Secondary | ICD-10-CM | POA: Insufficient documentation

## 2022-12-03 DIAGNOSIS — I10 Essential (primary) hypertension: Secondary | ICD-10-CM | POA: Insufficient documentation

## 2022-12-03 DIAGNOSIS — N920 Excessive and frequent menstruation with regular cycle: Secondary | ICD-10-CM

## 2022-12-03 DIAGNOSIS — D369 Benign neoplasm, unspecified site: Secondary | ICD-10-CM | POA: Insufficient documentation

## 2022-12-03 DIAGNOSIS — N939 Abnormal uterine and vaginal bleeding, unspecified: Secondary | ICD-10-CM | POA: Insufficient documentation

## 2022-12-03 DIAGNOSIS — B009 Herpesviral infection, unspecified: Secondary | ICD-10-CM | POA: Insufficient documentation

## 2022-12-03 HISTORY — DX: Excessive and frequent menstruation with regular cycle: N92.0

## 2022-12-03 HISTORY — DX: Other specified noninflammatory disorders of vagina: N89.8

## 2022-12-03 HISTORY — DX: Pyuria: R82.81

## 2022-12-03 HISTORY — DX: Abnormal uterine and vaginal bleeding, unspecified: N93.9

## 2022-12-03 HISTORY — DX: Benign neoplasm, unspecified site: D36.9

## 2023-03-04 DIAGNOSIS — Z23 Encounter for immunization: Secondary | ICD-10-CM | POA: Diagnosis not present

## 2023-03-04 DIAGNOSIS — S61011A Laceration without foreign body of right thumb without damage to nail, initial encounter: Secondary | ICD-10-CM | POA: Diagnosis not present

## 2023-03-04 DIAGNOSIS — W25XXXA Contact with sharp glass, initial encounter: Secondary | ICD-10-CM | POA: Diagnosis not present

## 2023-04-25 DIAGNOSIS — F3163 Bipolar disorder, current episode mixed, severe, without psychotic features: Secondary | ICD-10-CM | POA: Diagnosis not present

## 2023-05-06 DIAGNOSIS — B349 Viral infection, unspecified: Secondary | ICD-10-CM | POA: Diagnosis not present

## 2023-05-10 DIAGNOSIS — F3161 Bipolar disorder, current episode mixed, mild: Secondary | ICD-10-CM | POA: Diagnosis not present

## 2023-05-23 DIAGNOSIS — F3161 Bipolar disorder, current episode mixed, mild: Secondary | ICD-10-CM | POA: Diagnosis not present

## 2023-05-26 DIAGNOSIS — F419 Anxiety disorder, unspecified: Secondary | ICD-10-CM | POA: Diagnosis not present

## 2023-06-09 DIAGNOSIS — F3163 Bipolar disorder, current episode mixed, severe, without psychotic features: Secondary | ICD-10-CM | POA: Diagnosis not present

## 2023-06-16 DIAGNOSIS — F3163 Bipolar disorder, current episode mixed, severe, without psychotic features: Secondary | ICD-10-CM | POA: Diagnosis not present

## 2023-06-17 ENCOUNTER — Ambulatory Visit: Payer: Medicaid Other | Admitting: Obstetrics and Gynecology

## 2023-06-21 DIAGNOSIS — F319 Bipolar disorder, unspecified: Secondary | ICD-10-CM

## 2023-06-21 HISTORY — DX: Bipolar disorder, unspecified: F31.9

## 2023-06-23 DIAGNOSIS — F3163 Bipolar disorder, current episode mixed, severe, without psychotic features: Secondary | ICD-10-CM | POA: Diagnosis not present

## 2023-07-06 ENCOUNTER — Other Ambulatory Visit: Payer: Self-pay | Admitting: Family Medicine

## 2023-07-11 DIAGNOSIS — R319 Hematuria, unspecified: Secondary | ICD-10-CM | POA: Diagnosis not present

## 2023-07-11 DIAGNOSIS — R109 Unspecified abdominal pain: Secondary | ICD-10-CM | POA: Diagnosis not present

## 2023-07-11 DIAGNOSIS — R110 Nausea: Secondary | ICD-10-CM | POA: Diagnosis not present

## 2023-07-11 DIAGNOSIS — R10817 Generalized abdominal tenderness: Secondary | ICD-10-CM | POA: Diagnosis not present

## 2023-07-12 DIAGNOSIS — F419 Anxiety disorder, unspecified: Secondary | ICD-10-CM | POA: Diagnosis not present

## 2023-07-13 DIAGNOSIS — F3163 Bipolar disorder, current episode mixed, severe, without psychotic features: Secondary | ICD-10-CM | POA: Diagnosis not present

## 2023-07-26 DIAGNOSIS — F3163 Bipolar disorder, current episode mixed, severe, without psychotic features: Secondary | ICD-10-CM | POA: Diagnosis not present

## 2023-08-01 DIAGNOSIS — F3162 Bipolar disorder, current episode mixed, moderate: Secondary | ICD-10-CM | POA: Diagnosis not present

## 2023-08-01 DIAGNOSIS — F419 Anxiety disorder, unspecified: Secondary | ICD-10-CM | POA: Diagnosis not present

## 2023-08-05 DIAGNOSIS — F3163 Bipolar disorder, current episode mixed, severe, without psychotic features: Secondary | ICD-10-CM | POA: Diagnosis not present

## 2023-08-11 ENCOUNTER — Telehealth: Payer: Self-pay | Admitting: Family Medicine

## 2023-08-11 ENCOUNTER — Other Ambulatory Visit: Payer: Self-pay | Admitting: Family Medicine

## 2023-08-11 DIAGNOSIS — N644 Mastodynia: Secondary | ICD-10-CM

## 2023-08-11 DIAGNOSIS — N6452 Nipple discharge: Secondary | ICD-10-CM

## 2023-08-11 MED ORDER — NYSTATIN 100000 UNIT/GM EX CREA: TOPICAL_CREAM | CUTANEOUS | 1 refills | Status: AC

## 2023-08-11 NOTE — Telephone Encounter (Addendum)
 Returned call to patient.   Patient reports her breasts are sore, tender, feel full and leaking milk like substance. She says they feel like when she was pregnant. She has had an IUD for the last 3 years. These symptoms were present 2-3 months ago and then again started 2-3 weeks ago. She says she had 2 cycles in April with last around April 23. Her periods are not always regular. She does not feel like she is pregnant.   She has breast fed her last 3 children, youngest child is 3 and she stopped BF about 56-63 months of age.   She has a follow up appointment scheduled for 09/21/2023. Has not been seen in office for provider appointment in over 2 years (02/19/21).   Spoke with Dr. Ilona Malta who recommends lab work (Hcg, Prolactin, TSH, CMP) and Diagnostic Bilateral Mammogram.   Called [facility] to schedule US /Mammogram Scheduled for 08/30/23 at 1:50 pm.   Called patient back and gave her information on lab work ordered ( appointment scheduled) patient informed to come to appointment fasting and no nipple stimulation that morning.   Informed her of scheduled Mammogram/US , date and time given.   Patient now reports burning and itching to nipples that is very uncomfortable and sore. Reviewed will send in Nystatin  Cream to use 3-4 times a day for s/s yeast infection.   Patient voiced understanding and plans to follow up on July 2. Reported to her that she will be called for any abnormal results.

## 2023-08-11 NOTE — Addendum Note (Signed)
 Addended by: Gracie Lav on: 08/11/2023 03:41 PM   Modules accepted: Orders

## 2023-08-11 NOTE — Telephone Encounter (Signed)
 Patient would like a nurse to give her a call she said she is having issues with her breast and nipples.

## 2023-08-16 ENCOUNTER — Other Ambulatory Visit

## 2023-08-16 DIAGNOSIS — S76311A Strain of muscle, fascia and tendon of the posterior muscle group at thigh level, right thigh, initial encounter: Secondary | ICD-10-CM | POA: Diagnosis not present

## 2023-08-18 ENCOUNTER — Other Ambulatory Visit

## 2023-08-18 ENCOUNTER — Other Ambulatory Visit: Payer: Self-pay

## 2023-08-18 DIAGNOSIS — N644 Mastodynia: Secondary | ICD-10-CM

## 2023-08-18 DIAGNOSIS — F3163 Bipolar disorder, current episode mixed, severe, without psychotic features: Secondary | ICD-10-CM | POA: Diagnosis not present

## 2023-08-18 DIAGNOSIS — N6452 Nipple discharge: Secondary | ICD-10-CM

## 2023-08-19 ENCOUNTER — Ambulatory Visit: Payer: Self-pay | Admitting: Family Medicine

## 2023-08-19 LAB — COMPREHENSIVE METABOLIC PANEL WITH GFR
ALT: 18 IU/L (ref 0–32)
AST: 22 IU/L (ref 0–40)
Albumin: 4.2 g/dL (ref 3.9–4.9)
Alkaline Phosphatase: 55 IU/L (ref 44–121)
BUN/Creatinine Ratio: 12 (ref 9–23)
BUN: 10 mg/dL (ref 6–20)
Bilirubin Total: <0.2 mg/dL (ref 0.0–1.2)
CO2: 18 mmol/L — ABNORMAL LOW (ref 20–29)
Calcium: 8.8 mg/dL (ref 8.7–10.2)
Chloride: 104 mmol/L (ref 96–106)
Creatinine, Ser: 0.84 mg/dL (ref 0.57–1.00)
Globulin, Total: 2.5 g/dL (ref 1.5–4.5)
Glucose: 82 mg/dL (ref 70–99)
Potassium: 4.4 mmol/L (ref 3.5–5.2)
Sodium: 143 mmol/L (ref 134–144)
Total Protein: 6.7 g/dL (ref 6.0–8.5)
eGFR: 95 mL/min/{1.73_m2} (ref >59)

## 2023-08-19 LAB — BETA HCG QUANT (REF LAB): hCG Quant: <1 m[IU]/mL

## 2023-08-19 LAB — PROLACTIN: Prolactin: 7.7 ng/mL (ref 4.8–33.4)

## 2023-08-19 LAB — TSH: TSH: 0.661 u[IU]/mL (ref 0.450–4.500)

## 2023-08-30 ENCOUNTER — Ambulatory Visit
Admission: RE | Admit: 2023-08-30 | Discharge: 2023-08-30 | Disposition: A | Discharge status: Home / Self Care | Source: Ambulatory Visit | Attending: Family Medicine | Admitting: Family Medicine

## 2023-08-30 ENCOUNTER — Ambulatory Visit
Admission: RE | Admit: 2023-08-30 | Discharge: 2023-08-30 | Disposition: A | Discharge status: Home / Self Care | Source: Ambulatory Visit | Attending: Family Medicine

## 2023-08-30 ENCOUNTER — Encounter: Payer: Self-pay | Admitting: *Deleted

## 2023-08-30 DIAGNOSIS — N644 Mastodynia: Secondary | ICD-10-CM

## 2023-08-30 DIAGNOSIS — F419 Anxiety disorder, unspecified: Secondary | ICD-10-CM | POA: Diagnosis not present

## 2023-08-30 DIAGNOSIS — N6452 Nipple discharge: Secondary | ICD-10-CM | POA: Diagnosis not present

## 2023-08-30 DIAGNOSIS — F3162 Bipolar disorder, current episode mixed, moderate: Secondary | ICD-10-CM | POA: Diagnosis not present

## 2023-09-14 DIAGNOSIS — F3163 Bipolar disorder, current episode mixed, severe, without psychotic features: Secondary | ICD-10-CM | POA: Diagnosis not present

## 2023-09-19 NOTE — Progress Notes (Unsigned)
   GYNECOLOGY PROGRESS NOTE  History:  33 y.o. [id number] presents to [facility] *** office today for problem gyn visit. She reports *****.  She denies h/a, dizziness, shortness of breath, n/v, or fever/chills.    The following portions of the patient's history were reviewed and updated as appropriate: allergies, current medications, past family history, past medical history, past social history, past surgical history and problem list. Last pap smear on *** was normal, *** HRHPV.  Health Maintenance Due  Topic Date Due   Hepatitis B Vaccines (1 of 3 - 19+ 3-dose series) Never done   HPV VACCINES (1 - 3-dose SCDM series) Never done   COVID-19 Vaccine (3 - 2024-25 season) 11/21/2022   Cervical Cancer Screening (HPV/Pap Cotest)  02/05/2023     Review of Systems:  Pertinent items are noted in HPI.   Objective:  Physical Exam There were no vitals taken for this visit. VS reviewed, nursing note reviewed,  Constitutional: well developed, well nourished, no distress HEENT: normocephalic CV: normal rate Pulm/chest wall: normal effort Breast Exam: deferred Abdomen: soft Neuro: alert and oriented x 3 Skin: warm, dry Psych: affect normal Pelvic exam: Cervix pink, visually closed, without lesion, scant white creamy discharge, vaginal walls and external genitalia normal Bimanual exam: Cervix 0/long/high, firm, anterior, neg CMT, uterus nontender, nonenlarged, adnexa without tenderness, enlargement, or mass  Assessment & Plan:  1. Breast pain in female (Primary) ***  2. Screening for STD (sexually transmitted disease) ***   No follow-ups on file.   Camie DELENA Rote, CNM 6:49 PM

## 2023-09-21 ENCOUNTER — Ambulatory Visit: Admitting: Certified Nurse Midwife

## 2023-09-21 DIAGNOSIS — N644 Mastodynia: Secondary | ICD-10-CM

## 2023-09-21 DIAGNOSIS — Z113 Encounter for screening for infections with a predominantly sexual mode of transmission: Secondary | ICD-10-CM

## 2023-09-22 ENCOUNTER — Ambulatory Visit
Admission: RE | Admit: 2023-09-22 | Discharge: 2023-09-22 | Disposition: A | Discharge status: Home / Self Care | Source: Ambulatory Visit | Attending: Family Medicine | Admitting: Family Medicine

## 2023-09-22 DIAGNOSIS — N644 Mastodynia: Secondary | ICD-10-CM | POA: Diagnosis not present

## 2023-09-29 ENCOUNTER — Telehealth (INDEPENDENT_AMBULATORY_CARE_PROVIDER_SITE_OTHER): Payer: Self-pay | Admitting: Primary Care

## 2023-09-29 ENCOUNTER — Ambulatory Visit (INDEPENDENT_AMBULATORY_CARE_PROVIDER_SITE_OTHER): Admitting: Primary Care

## 2023-09-29 NOTE — Telephone Encounter (Signed)
 Called pt to see if interested in rescheduling missed New pt appt. Please advise.

## 2023-10-03 DIAGNOSIS — F3163 Bipolar disorder, current episode mixed, severe, without psychotic features: Secondary | ICD-10-CM | POA: Diagnosis not present

## 2023-10-04 DIAGNOSIS — F419 Anxiety disorder, unspecified: Secondary | ICD-10-CM | POA: Diagnosis not present

## 2023-10-04 DIAGNOSIS — F3162 Bipolar disorder, current episode mixed, moderate: Secondary | ICD-10-CM | POA: Diagnosis not present

## 2023-10-14 DIAGNOSIS — F3163 Bipolar disorder, current episode mixed, severe, without psychotic features: Secondary | ICD-10-CM | POA: Diagnosis not present

## 2023-10-28 DIAGNOSIS — F3163 Bipolar disorder, current episode mixed, severe, without psychotic features: Secondary | ICD-10-CM | POA: Diagnosis not present

## 2023-11-14 DIAGNOSIS — F3163 Bipolar disorder, current episode mixed, severe, without psychotic features: Secondary | ICD-10-CM | POA: Diagnosis not present

## 2023-11-23 ENCOUNTER — Telehealth (INDEPENDENT_AMBULATORY_CARE_PROVIDER_SITE_OTHER): Payer: Self-pay | Admitting: Primary Care

## 2023-11-23 NOTE — Telephone Encounter (Signed)
 Spoke to pt about upcoming appt.. Will be present

## 2023-11-24 ENCOUNTER — Ambulatory Visit (INDEPENDENT_AMBULATORY_CARE_PROVIDER_SITE_OTHER): Admitting: Primary Care

## 2023-11-24 ENCOUNTER — Encounter (INDEPENDENT_AMBULATORY_CARE_PROVIDER_SITE_OTHER): Payer: Self-pay | Admitting: Primary Care

## 2023-11-24 VITALS — BP 133/84 | HR 76 | Resp 20 | Ht 67.0 in | Wt 260.0 lb

## 2023-11-24 DIAGNOSIS — Z23 Encounter for immunization: Secondary | ICD-10-CM

## 2023-11-24 DIAGNOSIS — Z7689 Persons encountering health services in other specified circumstances: Secondary | ICD-10-CM

## 2023-11-24 DIAGNOSIS — Z79899 Other long term (current) drug therapy: Secondary | ICD-10-CM

## 2023-11-24 NOTE — Progress Notes (Signed)
 New Patient Office Visit  Subjective    Patient ID: Annette Greene female  DOB: 03-05-1991  Age: 33 y.o. MRN: [id number]   CC:   weight   HPI  Annette Greene is a 33 year old morbid obese female in today to establish care.  Patient voices concerns about how she has tried losing weight changing diet exercising with no significant results.  It has affected her self-esteem referred to a pregnant elephant with twins.  She is sexually active on IUD for 7 years for birth control place 07/03/20 . She is also followed by behavioral health with psychiatry and psychologist and medication prescribed outside of practice.  Advised patient to discuss weight loss medication that will not interfere with any medication that she is currently on.  If there is a medication preference is not.  Follow-up visit will be weight management Current Outpatient Medications on File Prior to Visit  Medication Sig Dispense Refill   escitalopram (LEXAPRO) 10 MG tablet Take 10 mg by mouth daily.     LamoTRIgine 300 MG TB24 24 hour tablet Take 1 tablet by mouth at bedtime.     ibuprofen  (ADVIL ) 800 MG tablet Take by mouth. (Patient not taking: Reported on 11/24/2023)     indomethacin (INDOCIN) 25 MG capsule  (Patient not taking: Reported on 11/24/2023)     metFORMIN  (GLUCOPHAGE -XR) 500 MG 24 hr tablet Take 500 mg by mouth every morning. (Patient not taking: Reported on 11/24/2023)     metroNIDAZOLE  (FLAGYL ) 500 MG tablet Take 1 tablet (500 mg total) by mouth 2 (two) times daily. (Patient not taking: Reported on 11/24/2023) 14 tablet 0   nitrofurantoin , macrocrystal-monohydrate, (MACROBID ) 100 MG capsule Take 1 capsule by mouth 2 (two) times daily. (Patient not taking: Reported on 11/24/2023)     nystatin  cream (MYCOSTATIN ) Apply thin layer to nipples 3-4 times a day. Continue to use for 2 weeks after all symptoms are gone. (Patient not taking: Reported on 11/24/2023) 30 g 1   ondansetron  (ZOFRAN -ODT) 4 MG disintegrating tablet Take by  mouth. (Patient not taking: Reported on 11/24/2023)     oxyCODONE -acetaminophen  (PERCOCET/ROXICET) 5-325 MG tablet  (Patient not taking: Reported on 11/24/2023)     valACYclovir  (VALTREX ) 500 MG tablet Take 1 tablet by mouth 2 (two) times daily. (Patient not taking: Reported on 11/24/2023)     No current facility-administered medications on file prior to visit.     No Known Allergies  Past Medical History:  Diagnosis Date   Abnormal vaginal bleeding 12/03/2022   Anxiety    Bipolar 1 disorder (HCC) 06/2023   Dermoid cyst 12/03/2022   bilateral per mfm     Herpes    History of premature delivery 12/11/2017   2018, incomp cervix at 26 wks, presented 2cm dilated at 24 wks     Hx of chlamydia infection    Hypertension    Incompetent cervix in pregnancy, antepartum 11/24/2016   G1 - SVD at 26 wks with painless cervical dilation G2 - prophylactic cerclage placed during pregnancy, uncomplicated NSVD at 38 wks G3 - cerclage placed 01/17/2020, taking Makena   Infection    UTI   Leukorrhea 12/03/2022   Menorrhagia 12/03/2022   Preterm labor    Pyuria 12/03/2022   Trichomonal vaginitis 12/13/2019   TOC 10/18 pending     Trichomonas infection 10/06/2016   Treated on 10/06/16, retest in 3 months. DO G/C CHLAMYDIA WITH GBS.       Past Surgical History:  Procedure Laterality Date  CERVICAL CERCLAGE N/A 01/25/2018   Procedure: CERCLAGE CERVICAL;  Surgeon: Henry Slough, MD;  Location: [facility];  Service: Gynecology;  Laterality: N/A;   TONSILLECTOMY AND ADENOIDECTOMY     WISDOM TOOTH EXTRACTION     all 4 teeth     Family History  Problem Relation Age of Onset   Healthy Mother    Hypertension Mother    Healthy Father    Healthy Sister    Healthy Maternal Grandmother    Hypertension Maternal Grandmother    Healthy Paternal Grandmother    Healthy Paternal Grandfather    Healthy Sister    Drug abuse Maternal Aunt    Hypertension Maternal Aunt    Diabetes Maternal Aunt      Social History   Socioeconomic History   Marital status: Single    Spouse name: Not on file   Number of children: Not on file   Years of education: Not on file   Highest education level: Not on file  Occupational History   Not on file  Tobacco Use   Smoking status: Every Day    Types: Cigars   Smokeless tobacco: Never   Tobacco comments:    stopped since pregnant  Vaping Use   Vaping status: Never Used  Substance and Sexual Activity   Alcohol use: Not Currently    Comment: stopped with pregnancy   Drug use: Not Currently   Sexual activity: Not Currently    Birth control/protection: None  Other Topics Concern   Not on file  Social History Narrative   Not on file   Social Drivers of Health   Financial Resource Strain: Low Risk  (01/16/2018)   Overall Financial Resource Strain (CARDIA)    Difficulty of Paying Living Expenses: Not hard at all  Food Insecurity: No Food Insecurity (03/07/2020)   Hunger Vital Sign    Worried About Running Out of Food in the Last Year: Never true    Ran Out of Food in the Last Year: Never true  Transportation Needs: No Transportation Needs (03/07/2020)   PRAPARE - Administrator, Civil Service (Medical): No    Lack of Transportation (Non-Medical): No  Physical Activity: Not on file  Stress: No Stress Concern Present (01/17/2020)   Received from [facility] - Occupational Stress Questionnaire    Feeling of Stress : Not at all  Social Connections: Unknown (08/03/2021)   Received from [facility]   Social Network    Social Network: Not on file  Intimate Partner Violence: Unknown (06/25/2021)   Received from [facility]   HITS    Physically Hurt: Not on file    Insult or Talk Down To: Not on file    Threaten Physical Harm: Not on file    Scream or Curse: Not on file       Health Maintenance  Topic Date Due   Pneumococcal Vaccine (1 of 2 - PCV) Never done   Hepatitis B  Vaccine (1 of 3 - 19+ 3-dose series) Never done   HPV Vaccine (1 - 3-dose SCDM series) Never done   Pap with HPV screening  02/05/2023   Flu Shot  Never done   COVID-19 Vaccine (3 - 2025-26 season) 11/21/2023   DTaP/Tdap/Td vaccine (3 - Td or Tdap) 07/05/2030   Hepatitis C Screening  Completed   HIV Screening  Completed   Meningitis B Vaccine  Aged Out    Objective    BP  133/84 (BP Location: Left Arm, Patient Position: Sitting, Cuff Size: Large)   Pulse 76   Resp 20   Ht 5' 7 (1.702 m)   Wt 260 lb (117.9 kg)   LMP 11/04/2023 (Approximate)   SpO2 100%   BMI 40.72 kg/m  BP Readings from Last 3 Encounters:  11/24/23 133/84  10/27/22 (!) 145/83  07/06/22 127/77   Physical Exam Vitals reviewed.  Constitutional:      Appearance: Normal appearance. She is obese.  HENT:     Head: Normocephalic.     Right Ear: Tympanic membrane, ear canal and external ear normal.     Left Ear: Tympanic membrane, ear canal and external ear normal.     Nose: Nose normal.     Mouth/Throat:     Mouth: Mucous membranes are moist.  Eyes:     Extraocular Movements: Extraocular movements intact.     Pupils: Pupils are equal, round, and reactive to light.  Cardiovascular:     Rate and Rhythm: Normal rate.  Pulmonary:     Effort: Pulmonary effort is normal.     Breath sounds: Normal breath sounds.  Abdominal:     General: Bowel sounds are normal. There is distension.     Palpations: Abdomen is soft.  Musculoskeletal:        General: Normal range of motion.     Cervical back: Normal range of motion and neck supple.  Skin:    General: Skin is warm and dry.  Neurological:     Mental Status: She is alert and oriented to person, place, and time.  Psychiatric:        Mood and Affect: Mood normal.        Behavior: Behavior normal.        Thought Content: Thought content normal.      Assessment & Plan:  Annette Greene was seen today for new patient (initial visit).  Diagnoses and all orders for this  visit:  Encounter to establish care  Need for influenza vaccination -     Flu vaccine trivalent PF, 6mos and older(Flulaval,Afluria,Fluarix,Fluzone)  Obesity, morbid (HCC) Obesity is > 40  indicating an excess in caloric intake or underlining conditions. This may lead to other co-morbidities. Educated on lifestyle modifications of diet and exercise which may reduce obesity.    Medication management -     CBC with Differential -     CMP14+EGFR       Follow-up:  Return in about 2 weeks (around 12/08/2023) for weigth management.  The above assessment and management plan was discussed with the patient. The patient verbalized understanding of and has agreed to the management plan. Patient is aware to call the clinic if symptoms fail to improve or worsen. Patient is aware when to return to the clinic for a follow-up visit. Patient educated on when it is appropriate to go to the emergency department.   Rosaline Bohr, NP-C

## 2023-11-24 NOTE — Progress Notes (Signed)
 Possible ozempic

## 2023-11-25 ENCOUNTER — Ambulatory Visit (INDEPENDENT_AMBULATORY_CARE_PROVIDER_SITE_OTHER): Payer: Self-pay | Admitting: Primary Care

## 2023-11-25 LAB — CMP14+EGFR
ALT: 15 IU/L (ref 0–32)
AST: 17 IU/L (ref 0–40)
Albumin: 4.3 g/dL (ref 3.9–4.9)
Alkaline Phosphatase: 50 IU/L (ref 44–121)
BUN/Creatinine Ratio: 10 (ref 9–23)
BUN: 9 mg/dL (ref 6–20)
Bilirubin Total: 0.2 mg/dL (ref 0.0–1.2)
CO2: 24 mmol/L (ref 20–29)
Calcium: 9 mg/dL (ref 8.7–10.2)
Chloride: 101 mmol/L (ref 96–106)
Creatinine, Ser: 0.91 mg/dL (ref 0.57–1.00)
Globulin, Total: 2.4 g/dL (ref 1.5–4.5)
Glucose: 78 mg/dL (ref 70–99)
Potassium: 4.1 mmol/L (ref 3.5–5.2)
Sodium: 139 mmol/L (ref 134–144)
Total Protein: 6.7 g/dL (ref 6.0–8.5)
eGFR: 86 mL/min/1.73 (ref >59)

## 2023-11-25 LAB — CBC WITH DIFFERENTIAL/PLATELET
Basophils Absolute: 0 x10E3/uL (ref 0.0–0.2)
Basos: 1 %
EOS (ABSOLUTE): 0.2 x10E3/uL (ref 0.0–0.4)
Eos: 2 %
Hematocrit: 38.7 % (ref 34.0–46.6)
Hemoglobin: 12.2 g/dL (ref 11.1–15.9)
Immature Grans (Abs): 0 x10E3/uL (ref 0.0–0.1)
Immature Granulocytes: 0 %
Lymphocytes Absolute: 1.9 x10E3/uL (ref 0.7–3.1)
Lymphs: 28 %
MCH: 28.6 pg (ref 26.6–33.0)
MCHC: 31.5 g/dL (ref 31.5–35.7)
MCV: 91 fL (ref 79–97)
Monocytes Absolute: 0.5 x10E3/uL (ref 0.1–0.9)
Monocytes: 8 %
Neutrophils Absolute: 4.1 x10E3/uL (ref 1.4–7.0)
Neutrophils: 61 %
Platelets: 248 x10E3/uL (ref 150–450)
RBC: 4.27 x10E6/uL (ref 3.77–5.28)
RDW: 13.2 % (ref 11.7–15.4)
WBC: 6.7 x10E3/uL (ref 3.4–10.8)

## 2023-11-29 DIAGNOSIS — F3162 Bipolar disorder, current episode mixed, moderate: Secondary | ICD-10-CM | POA: Diagnosis not present

## 2023-11-29 DIAGNOSIS — F419 Anxiety disorder, unspecified: Secondary | ICD-10-CM | POA: Diagnosis not present

## 2023-11-30 DIAGNOSIS — F3163 Bipolar disorder, current episode mixed, severe, without psychotic features: Secondary | ICD-10-CM | POA: Diagnosis not present

## 2023-12-09 ENCOUNTER — Telehealth (INDEPENDENT_AMBULATORY_CARE_PROVIDER_SITE_OTHER): Payer: Self-pay | Admitting: Primary Care

## 2023-12-09 NOTE — Telephone Encounter (Signed)
 1st attempt contacted pt resch appt to oct 14

## 2023-12-13 ENCOUNTER — Ambulatory Visit: Admitting: Family Medicine

## 2023-12-13 ENCOUNTER — Ambulatory Visit (INDEPENDENT_AMBULATORY_CARE_PROVIDER_SITE_OTHER): Admitting: Primary Care

## 2023-12-19 DIAGNOSIS — F3163 Bipolar disorder, current episode mixed, severe, without psychotic features: Secondary | ICD-10-CM | POA: Diagnosis not present

## 2023-12-27 ENCOUNTER — Telehealth (INDEPENDENT_AMBULATORY_CARE_PROVIDER_SITE_OTHER): Payer: Self-pay | Admitting: Primary Care

## 2023-12-27 NOTE — Telephone Encounter (Signed)
 Called pt to confirm appt. Pt will be present.

## 2024-01-02 ENCOUNTER — Telehealth (INDEPENDENT_AMBULATORY_CARE_PROVIDER_SITE_OTHER): Payer: Self-pay | Admitting: Primary Care

## 2024-01-02 NOTE — Telephone Encounter (Signed)
 Called pt to confirm appt. Pt will be present.

## 2024-01-03 ENCOUNTER — Ambulatory Visit (INDEPENDENT_AMBULATORY_CARE_PROVIDER_SITE_OTHER): Admitting: Primary Care

## 2024-01-03 DIAGNOSIS — M25572 Pain in left ankle and joints of left foot: Secondary | ICD-10-CM | POA: Diagnosis not present

## 2024-01-03 DIAGNOSIS — S92155A Nondisplaced avulsion fracture (chip fracture) of left talus, initial encounter for closed fracture: Secondary | ICD-10-CM | POA: Diagnosis not present

## 2024-01-03 DIAGNOSIS — X501XXA Overexertion from prolonged static or awkward postures, initial encounter: Secondary | ICD-10-CM | POA: Diagnosis not present

## 2024-01-05 DIAGNOSIS — F3163 Bipolar disorder, current episode mixed, severe, without psychotic features: Secondary | ICD-10-CM | POA: Diagnosis not present

## 2024-01-19 DIAGNOSIS — F3163 Bipolar disorder, current episode mixed, severe, without psychotic features: Secondary | ICD-10-CM | POA: Diagnosis not present

## 2024-02-02 DIAGNOSIS — F3163 Bipolar disorder, current episode mixed, severe, without psychotic features: Secondary | ICD-10-CM | POA: Diagnosis not present

## 2024-02-06 DIAGNOSIS — F3163 Bipolar disorder, current episode mixed, severe, without psychotic features: Secondary | ICD-10-CM | POA: Diagnosis not present

## 2024-02-08 DIAGNOSIS — S92101A Unspecified fracture of right talus, initial encounter for closed fracture: Secondary | ICD-10-CM | POA: Diagnosis not present

## 2024-02-08 DIAGNOSIS — S99911A Unspecified injury of right ankle, initial encounter: Secondary | ICD-10-CM | POA: Diagnosis not present

## 2024-02-08 DIAGNOSIS — S93401A Sprain of unspecified ligament of right ankle, initial encounter: Secondary | ICD-10-CM | POA: Diagnosis not present

## 2024-02-09 DIAGNOSIS — S82892D Other fracture of left lower leg, subsequent encounter for closed fracture with routine healing: Secondary | ICD-10-CM | POA: Diagnosis not present

## 2024-02-09 DIAGNOSIS — S99911A Unspecified injury of right ankle, initial encounter: Secondary | ICD-10-CM | POA: Diagnosis not present

## 2024-02-09 DIAGNOSIS — M25571 Pain in right ankle and joints of right foot: Secondary | ICD-10-CM | POA: Diagnosis not present

## 2024-02-09 DIAGNOSIS — M25572 Pain in left ankle and joints of left foot: Secondary | ICD-10-CM | POA: Diagnosis not present

## 2024-02-09 DIAGNOSIS — S82891D Other fracture of right lower leg, subsequent encounter for closed fracture with routine healing: Secondary | ICD-10-CM | POA: Diagnosis not present

## 2024-02-15 DIAGNOSIS — F3163 Bipolar disorder, current episode mixed, severe, without psychotic features: Secondary | ICD-10-CM | POA: Diagnosis not present

## 2024-02-21 DIAGNOSIS — F419 Anxiety disorder, unspecified: Secondary | ICD-10-CM | POA: Diagnosis not present

## 2024-02-21 DIAGNOSIS — F3162 Bipolar disorder, current episode mixed, moderate: Secondary | ICD-10-CM | POA: Diagnosis not present

## 2024-02-28 DIAGNOSIS — F3163 Bipolar disorder, current episode mixed, severe, without psychotic features: Secondary | ICD-10-CM | POA: Diagnosis not present
